# Patient Record
Sex: Female | Born: 1960 | Race: White | Hispanic: No | Marital: Married | Smoking: Never smoker
Health system: Southern US, Community
[De-identification: ages and names within clinical notes are randomized; demographics above are authoritative.]

## PROBLEM LIST (undated history)

## (undated) DIAGNOSIS — N951 Menopausal and female climacteric states: Secondary | ICD-10-CM

## (undated) DIAGNOSIS — E7800 Pure hypercholesterolemia, unspecified: Secondary | ICD-10-CM

## (undated) DIAGNOSIS — G43909 Migraine, unspecified, not intractable, without status migrainosus: Secondary | ICD-10-CM

## (undated) DIAGNOSIS — I10 Essential (primary) hypertension: Secondary | ICD-10-CM

## (undated) DIAGNOSIS — E119 Type 2 diabetes mellitus without complications: Secondary | ICD-10-CM

## (undated) DIAGNOSIS — K219 Gastro-esophageal reflux disease without esophagitis: Secondary | ICD-10-CM

## (undated) HISTORY — DX: Gastro-esophageal reflux disease without esophagitis: K21.9

## (undated) HISTORY — DX: Type 2 diabetes mellitus without complications: E11.9

## (undated) HISTORY — DX: Pure hypercholesterolemia, unspecified: E78.00

## (undated) HISTORY — DX: Migraine, unspecified, not intractable, without status migrainosus: G43.909

## (undated) HISTORY — DX: Essential (primary) hypertension: I10

## (undated) HISTORY — DX: Menopausal and female climacteric states: N95.1

---

## 2005-12-22 ENCOUNTER — Ambulatory Visit: Payer: Self-pay | Admitting: Internal Medicine

## 2006-04-22 HISTORY — PX: CARDIAC SURGERY: SHX584

## 2006-06-21 ENCOUNTER — Encounter: Payer: Self-pay | Admitting: Family Medicine

## 2006-06-22 ENCOUNTER — Encounter: Payer: Self-pay | Admitting: Family Medicine

## 2006-07-22 ENCOUNTER — Encounter: Payer: Self-pay | Admitting: Family Medicine

## 2006-08-22 ENCOUNTER — Encounter: Payer: Self-pay | Admitting: Family Medicine

## 2006-09-22 ENCOUNTER — Encounter: Payer: Self-pay | Admitting: Family Medicine

## 2006-10-21 ENCOUNTER — Encounter: Payer: Self-pay | Admitting: Family Medicine

## 2007-05-31 ENCOUNTER — Ambulatory Visit: Payer: Self-pay

## 2007-06-06 ENCOUNTER — Ambulatory Visit: Payer: Self-pay

## 2008-07-31 ENCOUNTER — Ambulatory Visit: Payer: Self-pay

## 2009-10-14 ENCOUNTER — Ambulatory Visit: Payer: Self-pay

## 2010-07-23 ENCOUNTER — Inpatient Hospital Stay: Payer: Self-pay | Admitting: Internal Medicine

## 2010-12-09 ENCOUNTER — Ambulatory Visit: Payer: Self-pay

## 2012-02-28 ENCOUNTER — Ambulatory Visit: Payer: Self-pay | Admitting: Internal Medicine

## 2012-08-22 DIAGNOSIS — N951 Menopausal and female climacteric states: Secondary | ICD-10-CM

## 2012-08-22 HISTORY — DX: Menopausal and female climacteric states: N95.1

## 2012-08-22 HISTORY — PX: COLONOSCOPY: SHX174

## 2013-02-12 ENCOUNTER — Ambulatory Visit: Payer: Self-pay | Admitting: General Surgery

## 2013-02-21 ENCOUNTER — Encounter: Payer: Self-pay | Admitting: *Deleted

## 2013-02-28 ENCOUNTER — Ambulatory Visit: Payer: Self-pay | Admitting: General Surgery

## 2013-03-20 ENCOUNTER — Encounter: Payer: Self-pay | Admitting: General Surgery

## 2013-03-20 ENCOUNTER — Ambulatory Visit (INDEPENDENT_AMBULATORY_CARE_PROVIDER_SITE_OTHER): Payer: BC Managed Care – PPO | Admitting: General Surgery

## 2013-03-20 ENCOUNTER — Other Ambulatory Visit: Payer: Self-pay | Admitting: General Surgery

## 2013-03-20 VITALS — BP 138/82 | HR 76 | Resp 14 | Ht 60.0 in | Wt 142.0 lb

## 2013-03-20 DIAGNOSIS — Z1211 Encounter for screening for malignant neoplasm of colon: Secondary | ICD-10-CM

## 2013-03-20 DIAGNOSIS — H539 Unspecified visual disturbance: Secondary | ICD-10-CM | POA: Insufficient documentation

## 2013-03-20 MED ORDER — AMOXICILLIN 500 MG PO TABS: 500.0000 mg | ORAL_TABLET | Freq: Two times a day (BID) | ORAL | Status: DC

## 2013-03-20 MED ORDER — POLYETHYLENE GLYCOL 3350 17 GM/SCOOP PO POWD: ORAL | Status: DC

## 2013-03-20 NOTE — Progress Notes (Signed)
Patient ID: Deborah Bartlett, female   DOB: [birth year], 52 y.o.   MRN: [id number]  Chief Complaint  Patient presents with  . Colonoscopy    HPI Deborah Bartlett is a 52 y.o. female.  Patient here today for colonoscopy evaluation.  States this is routine and she has never had one before.  Denies any GI symptoms.  No family history of colon cancer. HPI  Past Medical History  Diagnosis Date  . GERD (gastroesophageal reflux disease)   . Hypertension   . Hypercholesteremia   . Perimenopausal 2014    Past Surgical History  Procedure Laterality Date  . Cesarean section  1990, 1993, 1999  . 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 Hypertension Mother   . Diabetes Mother     Social History History  Substance Use Topics  . Smoking status: Never Smoker   . Smokeless tobacco: Not on file  . Alcohol Use: No    Allergies  Allergen Reactions  . Codeine Palpitations    dizzy    Current Outpatient Prescriptions  Medication Sig Dispense Refill  . atorvastatin (LIPITOR) 20 MG tablet Take 20 mg by mouth daily.      . ferrous sulfate 325 (65 FE) MG EC tablet Take 325 mg by mouth daily with breakfast.      . losartan-hydrochlorothiazide (HYZAAR) 100-25 MG per tablet Take 1 tablet by mouth daily.      . meloxicam (MOBIC) 15 MG tablet Take 15 mg by mouth daily.      Marland Kitchen omeprazole (PRILOSEC) 20 MG capsule Take 20 mg by mouth daily.      Marland Kitchen amoxicillin (AMOXIL) 500 MG tablet Take 1 tablet (500 mg total) by mouth 2 (two) times daily. Take all 4 tabs the morning of the procedure  4 tablet  0  . polyethylene glycol powder (GLYCOLAX/MIRALAX) powder 255 grams one bottle for colonoscopy prep  255 g  0   No current facility-administered medications for this visit.    Review of Systems Review of Systems  Constitutional: Negative.   Respiratory: Negative.   Cardiovascular: Negative.     Blood pressure 138/82, pulse 76, resp.  rate 14, height 5' (1.524 m), weight 142 lb (64.411 kg), last menstrual period 02/13/2013.  Physical Exam Physical Exam  Constitutional: She is oriented to person, place, and time. She appears well-developed and well-nourished.  Cardiovascular: Normal rate and regular rhythm.   Pulmonary/Chest: Effort normal and breath sounds normal.  Abdominal: Soft.  Lymphadenopathy:    She has no cervical adenopathy.  Neurological: She is alert and oriented to person, place, and time.  Skin: Skin is warm and dry.    Data Reviewed No data to review.  Assessment    Candidate for screening colonoscopy.     Plan    The indication for screening colonoscopy was reviewed. Risks and benefits including bleeding and perforation discussed.  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 In spite of this, I think antibiotic prophylaxis for a colonoscopy as appropriate.     Patient has been scheduled for a colonoscopy on 04-30-13 at [facility]. This patient has been instructed to take amoxicillin one hour pre-op.   Earline Mayotte 03/20/2013, 9:13 PM

## 2013-03-20 NOTE — Patient Instructions (Addendum)
Colonoscopy with possible biopsy/polypectomy prn: Information regarding the procedure, including its potential risks and complications (including but not limited to perforation of the bowel, which may require emergency surgery to repair, and bleeding) was verbally given to the patient. Educational information regarding lower instestinal endoscopy was given to the patient. Written instructions for how to complete the bowel prep using Miralax were provided. The importance of drinking ample fluids to avoid dehydration as a result of the prep emphasized.  Antibiotic prior to surgery  Patient has been scheduled for a colonoscopy on 04-30-13 at [facility].  This patient has been instructed to take amoxicillin one hour pre-op.

## 2013-04-24 ENCOUNTER — Telehealth: Payer: Self-pay | Admitting: *Deleted

## 2013-04-24 NOTE — Telephone Encounter (Signed)
Patient was contacted today to review medications. She states she is not taking Mobic but all other meds are the same. Patient was also instructed to pre-register since she has not done so already. We will proceed with colonoscopy that is scheduled for 04-30-13 at [facility]. This patient will call the office if she has further questions.

## 2013-04-29 ENCOUNTER — Telehealth: Payer: Self-pay | Admitting: *Deleted

## 2013-04-29 NOTE — Telephone Encounter (Signed)
Patient called the office to reschedule colonoscopy that was scheduled for 04-30-13 to 05-28-13. The patient could not get a ride worked out. Trish in endoscopy notified of the reschedule.

## 2013-05-27 ENCOUNTER — Telehealth: Payer: Self-pay | Admitting: *Deleted

## 2013-05-27 NOTE — Telephone Encounter (Signed)
Patient states she called this morning to pre-register. Bobbie in endoscopy notified. Patient reports no change in medications since last office visit. We will proceed with colonoscopy that is scheduled for 05-28-13 at [facility]. She will call the office if she has further questions.

## 2013-05-28 ENCOUNTER — Ambulatory Visit: Payer: Self-pay | Admitting: General Surgery

## 2013-05-28 DIAGNOSIS — Z1211 Encounter for screening for malignant neoplasm of colon: Secondary | ICD-10-CM

## 2013-05-29 ENCOUNTER — Encounter: Payer: Self-pay | Admitting: General Surgery

## 2013-06-04 ENCOUNTER — Telehealth: Payer: Self-pay | Admitting: Internal Medicine

## 2013-06-04 ENCOUNTER — Encounter: Payer: Self-pay | Admitting: Internal Medicine

## 2013-06-04 ENCOUNTER — Ambulatory Visit (INDEPENDENT_AMBULATORY_CARE_PROVIDER_SITE_OTHER): Payer: BC Managed Care – PPO | Admitting: Internal Medicine

## 2013-06-04 VITALS — BP 120/70 | HR 86 | Temp 98.0°F | Ht 60.5 in | Wt 145.2 lb

## 2013-06-04 DIAGNOSIS — N809 Endometriosis, unspecified: Secondary | ICD-10-CM

## 2013-06-04 DIAGNOSIS — K219 Gastro-esophageal reflux disease without esophagitis: Secondary | ICD-10-CM

## 2013-06-04 DIAGNOSIS — R51 Headache: Secondary | ICD-10-CM

## 2013-06-04 DIAGNOSIS — E7800 Pure hypercholesterolemia, unspecified: Secondary | ICD-10-CM

## 2013-06-04 DIAGNOSIS — B351 Tinea unguium: Secondary | ICD-10-CM

## 2013-06-04 DIAGNOSIS — I10 Essential (primary) hypertension: Secondary | ICD-10-CM

## 2013-06-04 MED ORDER — ATORVASTATIN CALCIUM 20 MG PO TABS: 20.0000 mg | ORAL_TABLET | Freq: Every day | ORAL | Status: DC

## 2013-06-04 MED ORDER — LOSARTAN POTASSIUM-HCTZ 100-25 MG PO TABS: 1.0000 | ORAL_TABLET | Freq: Every day | ORAL | Status: DC

## 2013-06-04 NOTE — Telephone Encounter (Signed)
done

## 2013-06-04 NOTE — Telephone Encounter (Signed)
Losartan and atorvastatin refills needed.  Pt voiced at check-out.  Forgot to tell during appt today.

## 2013-06-09 ENCOUNTER — Encounter: Payer: Self-pay | Admitting: Internal Medicine

## 2013-06-09 DIAGNOSIS — E7800 Pure hypercholesterolemia, unspecified: Secondary | ICD-10-CM | POA: Insufficient documentation

## 2013-06-09 DIAGNOSIS — I10 Essential (primary) hypertension: Secondary | ICD-10-CM | POA: Insufficient documentation

## 2013-06-09 DIAGNOSIS — K219 Gastro-esophageal reflux disease without esophagitis: Secondary | ICD-10-CM | POA: Insufficient documentation

## 2013-06-09 DIAGNOSIS — N809 Endometriosis, unspecified: Secondary | ICD-10-CM | POA: Insufficient documentation

## 2013-06-09 NOTE — Assessment & Plan Note (Signed)
On omeprazole.  Obtain records regarding previous w/up.  Instructed on small bites, chew food well and eat slowly.  Do not eat late.

## 2013-06-09 NOTE — Progress Notes (Signed)
Subjective:    Patient ID: Deborah Bartlett, female    DOB: 10-10-1960, 52 y.o.   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  She comes in today to follow up on these issues as well as to establish care.  Previously followed by Dr Veneda Melter.  Has endometriosis.  Sees Dr Luella Cook.  Up to date with her pelvic and pap smears.  Last 5/14.  No history of abnormal pap.  Has a history of intermittent headaches.  Stress triggers.  Has to get in a dark room and sleep.  Takes excedrin and this works.  She is now working two jobs.  Last headache two months ago.  They may last 2-3 days.  Does not miss work.  Headaches are bilateral.  She also has GERD.  On omeprazole.  Occasional dysphagia.  No chest pain or tightness.  Breathing stable.  Has high blood pressure.  Diagnosed after her last child was born.  She also has a history of cardiac surgery secondary to a birth defect.  Rides her bike now and walks without getting winded.  She does feel like her legs are going to give out.  This has been stable since her surgery.  Sees Dr Gwen Pounds.  Overall she feels things are stable.    Past Medical History  Diagnosis Date  . GERD (gastroesophageal reflux disease)   . Hypertension   . Hypercholesteremia   . Perimenopausal 2014  . Migraine     Outpatient Encounter Prescriptions as of 06/04/2013  Medication Sig Dispense Refill  . ferrous sulfate 325 (65 FE) MG EC tablet Take 325 mg by mouth daily with breakfast.      . omeprazole (PRILOSEC) 20 MG capsule Take 20 mg by mouth daily.      . [DISCONTINUED] atorvastatin (LIPITOR) 20 MG tablet Take 20 mg by mouth daily.      . [DISCONTINUED] losartan-hydrochlorothiazide (HYZAAR) 100-25 MG per tablet Take 1 tablet by mouth daily.      . [DISCONTINUED] amoxicillin (AMOXIL) 500 MG tablet Take 1 tablet (500 mg total) by mouth 2  (two) times daily. Take all 4 tabs the morning of the procedure  4 tablet  0  . [DISCONTINUED] meloxicam (MOBIC) 15 MG tablet Take 15 mg by mouth daily.      . [DISCONTINUED] polyethylene glycol powder (GLYCOLAX/MIRALAX) powder 255 grams one bottle for colonoscopy prep  255 g  0   No facility-administered encounter medications on file as of 06/04/2013.    Review of Systems Patient denies any headache, lightheadedness or dizziness.  Has a history of headaches.  None in two months.   No chest pain, tightness or palpitations.  No increased shortness of breath, cough or congestion.  No nausea or vomiting.  On omeprazole for acid reflux.   No abdominal pain or cramping.  No bowel change, such as diarrhea, constipation, BRBPR or melana.  No urine change.   Overall feels things are stable.      Objective:   Physical Exam Filed Vitals:   06/04/13 1450  BP: 120/70  Pulse: 86  Temp: 98 F (36.7 C)   Pulse recheck 46  52 year old female in no acute distress.   HEENT:  Nares- clear.  Oropharynx - without lesions. NECK:  Supple.  Nontender.  No audible bruit.  HEART:  Appears to be regular. LUNGS:  No crackles or  wheezing audible.  Respirations even and unlabored.  RADIAL PULSE:  Equal bilaterally.    BREASTS:  Performed by gyn.  ABDOMEN:  Soft, nontender.  Bowel sounds present and normal.  No audible abdominal bruit.  GU:  Performed by gyn.  EXTREMITIES:  No increased edema present.  DP pulses palpable and equal bilaterally.         Assessment & Plan:  PODIATRY.  Has a toe nail she would like evaluated.  Some pain.  Fungus present.  Refer to podiatry for evaluation.   HEALTH MAINTENANCE.  Gets her breast, pelvic and pap smears through Dr Hiram Comber office.  Mammogram 5/14 - ok. Had colonoscopy 2014.  (05/28/13 - normal).  Recommended repeat colonoscopy in 10 years.    I spent 45 minutes with the patient and more than 50% of the time was spent in consultation regarding the above.

## 2013-06-09 NOTE — Assessment & Plan Note (Signed)
On atorvastatin.  Low cholesterol diet.  Check lipid panel and liver function.   

## 2013-06-09 NOTE — Assessment & Plan Note (Signed)
Blood pressure controlled on current regimen.  Follow metabolic panel.   

## 2013-06-09 NOTE — Assessment & Plan Note (Signed)
No headache in two months.  Follow.

## 2013-06-09 NOTE — Assessment & Plan Note (Signed)
Sees Dr Luella Cook.  Up to date with her pelvic exams.

## 2013-06-18 ENCOUNTER — Ambulatory Visit: Payer: BC Managed Care – PPO | Admitting: Podiatry

## 2013-06-21 ENCOUNTER — Other Ambulatory Visit (INDEPENDENT_AMBULATORY_CARE_PROVIDER_SITE_OTHER): Payer: BC Managed Care – PPO

## 2013-06-21 ENCOUNTER — Encounter: Payer: Self-pay | Admitting: Podiatry

## 2013-06-21 ENCOUNTER — Ambulatory Visit (INDEPENDENT_AMBULATORY_CARE_PROVIDER_SITE_OTHER): Payer: BC Managed Care – PPO | Admitting: Podiatry

## 2013-06-21 VITALS — BP 120/70 | HR 86 | Resp 16 | Ht 60.0 in | Wt 140.0 lb

## 2013-06-21 DIAGNOSIS — L600 Ingrowing nail: Secondary | ICD-10-CM

## 2013-06-21 DIAGNOSIS — I10 Essential (primary) hypertension: Secondary | ICD-10-CM

## 2013-06-21 DIAGNOSIS — E7800 Pure hypercholesterolemia, unspecified: Secondary | ICD-10-CM

## 2013-06-21 DIAGNOSIS — B351 Tinea unguium: Secondary | ICD-10-CM

## 2013-06-21 DIAGNOSIS — R51 Headache: Secondary | ICD-10-CM

## 2013-06-21 LAB — CBC WITH DIFFERENTIAL/PLATELET
Basophils Absolute: 0 10*3/uL (ref 0.0–0.1)
Basophils Relative: 0.9 % (ref 0.0–3.0)
Eosinophils Relative: 2.2 % (ref 0.0–5.0)
HCT: 39 % (ref 36.0–46.0)
Hemoglobin: 13.2 g/dL (ref 12.0–15.0)
Lymphocytes Relative: 30.6 % (ref 12.0–46.0)
Lymphs Abs: 1.4 10*3/uL (ref 0.7–4.0)
MCHC: 33.8 g/dL (ref 30.0–36.0)
MCV: 86.8 fl (ref 78.0–100.0)
Monocytes Absolute: 0.4 10*3/uL (ref 0.1–1.0)
Monocytes Relative: 7.9 % (ref 3.0–12.0)
Neutro Abs: 2.7 10*3/uL (ref 1.4–7.7)
Platelets: 302 10*3/uL (ref 150.0–400.0)
RBC: 4.49 Mil/uL (ref 3.87–5.11)
RDW: 13.3 % (ref 11.5–14.6)
WBC: 4.7 10*3/uL (ref 4.5–10.5)

## 2013-06-21 LAB — COMPREHENSIVE METABOLIC PANEL
ALT: 23 U/L (ref 0–35)
CO2: 29 mEq/L (ref 19–32)
Calcium: 9.8 mg/dL (ref 8.4–10.5)
Chloride: 101 mEq/L (ref 96–112)
Creatinine, Ser: 0.7 mg/dL (ref 0.4–1.2)
GFR: 90.73 mL/min (ref >60.00)
Glucose, Bld: 100 mg/dL — ABNORMAL HIGH (ref 70–99)
Sodium: 137 mEq/L (ref 135–145)
Total Protein: 7.6 g/dL (ref 6.0–8.3)

## 2013-06-21 LAB — TSH: TSH: 1.36 u[IU]/mL (ref 0.35–5.50)

## 2013-06-21 LAB — LIPID PANEL
Total CHOL/HDL Ratio: 4
Triglycerides: 123 mg/dL (ref 0.0–149.0)

## 2013-06-21 MED ORDER — HYDROCODONE-ACETAMINOPHEN 10-325 MG PO TABS: 1.0000 | ORAL_TABLET | Freq: Three times a day (TID) | ORAL | Status: DC | PRN

## 2013-06-21 NOTE — Patient Instructions (Signed)

## 2013-06-21 NOTE — Progress Notes (Signed)
N ingrown toenail , thick , sore  L right great medial side hurts more  D ongoing for years  O gradual   C worrse A anything to touch it  T no treatment ,

## 2013-06-21 NOTE — Progress Notes (Signed)
Subjective:     Patient ID: Deborah Bartlett, female   DOB: Dec 30, 1960, 52 y.o.   MRN: [id number]  HPI patient presents stating I have ingrown toenails on my big nail second nail right foot and might big toenail on the left with the big one on the right and the second one being very thick. Patient states going on for a number of years and getting worse and that she cannot cut them herself referred by Dr. Dale Stratford   Review of Systems  All other systems reviewed and are negative.       Objective:   Physical Exam  Nursing note and vitals reviewed. Constitutional: She is oriented to person, place, and time.  Cardiovascular: Intact distal pulses.   Neurological: She is oriented to person, place, and time.  Skin: Skin is warm.   patient has thickened deformed nails hallux second nail right hallux nail left is ingrown in the corners patient's neurovascular status intact and muscle strength found to be adequate     Assessment:     Damaged hallux and second nails right with dorsal pain noted an ingrown toenail left hallux    Plan:     H&P performed and condition discussed. Today were focusing on the right foot. I discussed correction of the nails and explained permanent removal of the nailbeds and risk associated with the procedure patient want surgery and today I infiltrated 120 mg Xylocaine Marcaine mixture remove the hallux and second nails exposed the matrix and applied phenol followed by alcohol 5 applications with alcohol lavage after that and sterile dressing application. Hydrocodone for postoperative pain and soaks to beginning 24 hours. Reappoint 3 weeks to discuss the left foot

## 2013-06-21 NOTE — Addendum Note (Signed)
Addended by: Lenn Sink on: 06/21/2013 04:15 PM   Modules accepted: Orders

## 2013-06-23 ENCOUNTER — Encounter: Payer: Self-pay | Admitting: Internal Medicine

## 2013-06-25 NOTE — Telephone Encounter (Signed)
Mailed unread message to pt  

## 2013-07-12 ENCOUNTER — Encounter: Payer: Self-pay | Admitting: Podiatry

## 2013-07-12 ENCOUNTER — Ambulatory Visit (INDEPENDENT_AMBULATORY_CARE_PROVIDER_SITE_OTHER): Payer: BC Managed Care – PPO | Admitting: Podiatry

## 2013-07-12 VITALS — BP 120/72 | HR 80 | Resp 16 | Ht 61.0 in | Wt 140.0 lb

## 2013-07-12 DIAGNOSIS — L600 Ingrowing nail: Secondary | ICD-10-CM

## 2013-07-12 NOTE — Progress Notes (Signed)
Subjective:     Patient ID: Deborah Bartlett, female   DOB: Dec 26, 1960, 52 y.o.   MRN: [id number]  HPI patient's right hallux and second nail are crusted over and healing well postoperatively. She needs the left one done but wants to hold off  Review of Systems     Objective:   Physical Exam Well-healing nail side    Assessment:     Nail sites doing well right foot    Plan:     Discharge right will reappoint when she wants her left nails fixed

## 2013-11-25 ENCOUNTER — Ambulatory Visit (INDEPENDENT_AMBULATORY_CARE_PROVIDER_SITE_OTHER): Payer: BC Managed Care – PPO | Admitting: Internal Medicine

## 2013-11-25 ENCOUNTER — Encounter: Payer: Self-pay | Admitting: Internal Medicine

## 2013-11-25 VITALS — BP 130/80 | HR 84 | Temp 98.2°F | Ht 61.0 in | Wt 142.5 lb

## 2013-11-25 DIAGNOSIS — N809 Endometriosis, unspecified: Secondary | ICD-10-CM

## 2013-11-25 DIAGNOSIS — E7800 Pure hypercholesterolemia, unspecified: Secondary | ICD-10-CM

## 2013-11-25 DIAGNOSIS — K219 Gastro-esophageal reflux disease without esophagitis: Secondary | ICD-10-CM

## 2013-11-25 DIAGNOSIS — H539 Unspecified visual disturbance: Secondary | ICD-10-CM

## 2013-11-25 DIAGNOSIS — R51 Headache: Secondary | ICD-10-CM

## 2013-11-25 DIAGNOSIS — I10 Essential (primary) hypertension: Secondary | ICD-10-CM

## 2013-11-25 LAB — HEPATIC FUNCTION PANEL
ALT: 22 U/L (ref 0–35)
AST: 20 U/L (ref 0–37)
Albumin: 4.1 g/dL (ref 3.5–5.2)
Alkaline Phosphatase: 101 U/L (ref 39–117)
BILIRUBIN DIRECT: 0.1 mg/dL (ref 0.0–0.3)
BILIRUBIN TOTAL: 0.3 mg/dL (ref 0.3–1.2)
Total Protein: 7.2 g/dL (ref 6.0–8.3)

## 2013-11-25 LAB — BASIC METABOLIC PANEL
BUN: 13 mg/dL (ref 6–23)
CALCIUM: 10.1 mg/dL (ref 8.4–10.5)
CO2: 32 mEq/L (ref 19–32)
CREATININE: 0.7 mg/dL (ref 0.4–1.2)
Chloride: 100 mEq/L (ref 96–112)
GFR: 98.41 mL/min (ref >60.00)
GLUCOSE: 92 mg/dL (ref 70–99)
Potassium: 4.3 mEq/L (ref 3.5–5.1)
Sodium: 140 mEq/L (ref 135–145)

## 2013-11-25 LAB — LIPID PANEL
Cholesterol: 184 mg/dL (ref 0–200)
HDL: 40.9 mg/dL (ref >39.00)
LDL Cholesterol: 107 mg/dL — ABNORMAL HIGH (ref 0–99)
Total CHOL/HDL Ratio: 4
Triglycerides: 182 mg/dL — ABNORMAL HIGH (ref 0.0–149.0)
VLDL: 36.4 mg/dL (ref 0.0–40.0)

## 2013-11-25 NOTE — Progress Notes (Signed)
Subjective:    Patient ID: Deborah Bartlett, female    DOB: 22-Dec-1960, 53 y.o.   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  She comes in today for a scheduled follow up.   Has endometriosis.  Sees Dr Luella Cookosenow.  Up to date with her pelvic and pap smears.  Last 5/14.  Has f/u scheduled 5/15.  No history of abnormal pap.  Has a history of intermittent headaches.  Stress triggers.  Has to get in a dark room and sleep.  Takes excedrin and this works.  Headaches are better.   She also has GERD.  On omeprazole.  No chest pain or tightness.  Breathing stable.  Has high blood pressure.  Diagnosed after her last child was born.  She also has a history of cardiac surgery secondary to a birth defect.  Sees Dr Gwen PoundsKowalski.  Overall she feels things are stable.  She had an episode this weekend while out shopping, that her vision was more blurred.  Just could not focus as well.  No actual vision loss.  No other neurological changes.  Blood pressure - 132/80.   No pain in the eye.  Is evaluated at [facility].     Past Medical History  Diagnosis Date  . GERD (gastroesophageal reflux disease)   . Hypertension   . Hypercholesteremia   . Perimenopausal 2014  . Migraine     Outpatient Encounter Prescriptions as of 11/25/2013  Medication Sig  . atorvastatin (LIPITOR) 20 MG tablet Take 1 tablet (20 mg total) by mouth daily.  . ferrous sulfate 325 (65 FE) MG EC tablet Take 325 mg by mouth daily with breakfast.  . losartan-hydrochlorothiazide (HYZAAR) 100-25 MG per tablet Take 1 tablet by mouth daily.  . Multiple Vitamin (MULTIVITAMIN) capsule Take 1 capsule by mouth daily.  Marland Kitchen. omeprazole (PRILOSEC) 20 MG capsule Take 20 mg by mouth daily.  . [DISCONTINUED] HYDROcodone-acetaminophen (NORCO) 10-325 MG per tablet Take 1 tablet by mouth every 8 (eight) hours as needed for pain.     Review of Systems Patient denies any headache, lightheadedness or dizziness.  Has a history of headaches.  Better.  Vision change as outlined.    No chest pain, tightness or palpitations.  No increased shortness of breath, cough or congestion.  No nausea or vomiting.  On omeprazole for acid reflux.   No abdominal pain or cramping.  No bowel change, such as diarrhea, constipation, BRBPR or melana.  No urine change.   Overall feels things are stable.      Objective:   Physical Exam  Filed Vitals:   11/25/13 1012  BP: 130/80  Pulse: 84  Temp: 98.2 F (36.8 C)   Blood pressure recheck:  25128/484  53 year old female in no acute distress.   HEENT:  Nares- clear.  Oropharynx - without lesions. NECK:  Supple.  Nontender.  No audible bruit.  HEART:  Appears to be regular. LUNGS:  No crackles or wheezing audible.  Respirations even and unlabored.  RADIAL PULSE:  Equal bilaterally.   ABDOMEN:  Soft, nontender.  Bowel sounds present and normal.  No audible abdominal bruit.  EXTREMITIES:  No increased edema present.  DP pulses palpable and equal bilaterally.         Assessment & Plan:  HEALTH MAINTENANCE.  Gets her breast, pelvic and pap smears through Dr Hiram Comberosenows office.  Mammogram 5/14 - ok. Had colonoscopy 2014.  (05/28/13 - normal).  Recommended repeat colonoscopy in 10 years.  Has f/u scheduled in 5/15.    I spent 25 minutes with the patient and more than 50% of the time was spent in consultation regarding the above.

## 2013-11-25 NOTE — Progress Notes (Signed)
Pre-visit discussion using our clinic review tool. No additional management support is needed unless otherwise documented below in the visit note.  

## 2013-11-27 ENCOUNTER — Encounter: Payer: Self-pay | Admitting: *Deleted

## 2013-11-27 ENCOUNTER — Encounter: Payer: Self-pay | Admitting: Internal Medicine

## 2013-11-27 NOTE — Assessment & Plan Note (Signed)
Blood pressure controlled on current regimen.  Follow metabolic panel.   

## 2013-11-27 NOTE — Telephone Encounter (Signed)
Mailed [facility] Lipid panel & resent mychart message also

## 2013-11-27 NOTE — Assessment & Plan Note (Signed)
Sees Dr Luella Cookosenow.  Up to date with her pelvic exams.  Scheduled a f/u in 5/15.

## 2013-11-27 NOTE — Assessment & Plan Note (Signed)
On atorvastatin.  Low cholesterol diet.  Follow lipid panel and liver function.   

## 2013-11-27 NOTE — Assessment & Plan Note (Signed)
On omeprazole.  No problems reported today.

## 2013-11-27 NOTE — Assessment & Plan Note (Signed)
Headaches better.  Follow.   

## 2013-11-27 NOTE — Assessment & Plan Note (Signed)
Vision change as outlined.  No actual vision loss.  Different from her migraine vision changes.  Will have opthalmology evaluate.

## 2013-12-20 ENCOUNTER — Other Ambulatory Visit: Payer: Self-pay | Admitting: Internal Medicine

## 2014-01-14 ENCOUNTER — Telehealth: Payer: Self-pay | Admitting: Internal Medicine

## 2014-01-14 NOTE — Telephone Encounter (Signed)
You can also call patient at work number [phone] Patient called in today states her right hand has been going numb and it is all fingers however her thumb is the worst and she says it is starting to be hard to pick up things. Patient would like to know if she can have a referral or does she need to come in and see you. She did say that it has been going on for a while and it doesn't hurt it just tingles Please advise.

## 2014-01-15 NOTE — Telephone Encounter (Signed)
Notified patient. Appt scheduled 01/20/14 for evaluation. Advised to call back with worsening symptoms prior to appt

## 2014-01-15 NOTE — Telephone Encounter (Signed)
I recommend an evaluation asap to better confirm etiology and to know where best to refer.  Thanks.

## 2014-01-20 ENCOUNTER — Ambulatory Visit (INDEPENDENT_AMBULATORY_CARE_PROVIDER_SITE_OTHER): Payer: BC Managed Care – PPO | Admitting: Internal Medicine

## 2014-01-20 ENCOUNTER — Encounter: Payer: Self-pay | Admitting: Internal Medicine

## 2014-01-20 VITALS — BP 110/70 | HR 87 | Temp 98.1°F | Ht 61.0 in | Wt 143.5 lb

## 2014-01-20 DIAGNOSIS — R51 Headache: Secondary | ICD-10-CM

## 2014-01-20 DIAGNOSIS — E7800 Pure hypercholesterolemia, unspecified: Secondary | ICD-10-CM

## 2014-01-20 DIAGNOSIS — K219 Gastro-esophageal reflux disease without esophagitis: Secondary | ICD-10-CM

## 2014-01-20 DIAGNOSIS — R200 Anesthesia of skin: Secondary | ICD-10-CM

## 2014-01-20 DIAGNOSIS — R209 Unspecified disturbances of skin sensation: Secondary | ICD-10-CM

## 2014-01-20 DIAGNOSIS — N809 Endometriosis, unspecified: Secondary | ICD-10-CM

## 2014-01-20 DIAGNOSIS — I10 Essential (primary) hypertension: Secondary | ICD-10-CM

## 2014-01-20 DIAGNOSIS — H539 Unspecified visual disturbance: Secondary | ICD-10-CM

## 2014-01-20 LAB — HM MAMMOGRAPHY

## 2014-01-20 MED ORDER — LOSARTAN POTASSIUM-HCTZ 100-25 MG PO TABS: 1.0000 | ORAL_TABLET | Freq: Every day | ORAL | Status: DC

## 2014-01-20 NOTE — Progress Notes (Signed)
Pre visit review using our clinic review tool, if applicable. No additional management support is needed unless otherwise documented below in the visit note. 

## 2014-01-25 ENCOUNTER — Encounter: Payer: Self-pay | Admitting: Internal Medicine

## 2014-01-25 DIAGNOSIS — R200 Anesthesia of skin: Secondary | ICD-10-CM | POA: Insufficient documentation

## 2014-01-25 NOTE — Assessment & Plan Note (Signed)
Blood pressure controlled on current regimen.  Follow metabolic panel.   

## 2014-01-25 NOTE — Assessment & Plan Note (Signed)
On atorvastatin.  Low cholesterol diet.  Follow lipid panel and liver function.   

## 2014-01-25 NOTE — Assessment & Plan Note (Signed)
Sees Dr Luella Cook.  Up to date with her pelvic exams.  Was to have followed up in 5/15.  Need records.

## 2014-01-25 NOTE — Assessment & Plan Note (Signed)
Headaches better.  Follow.   

## 2014-01-25 NOTE — Assessment & Plan Note (Signed)
Previous vision change as outlined.  No actual vision loss.  Different from her migraine vision changes.  Was supposed to have seen opthalmology.  Did not follow through with appt.  Has had no further episodes.  Taking aspirin daily.  Given the previous vision changes and now with the upper extremity numbness and tingling, will have neurology evaluate.  Discussed possible MRI, nerve conduction studies, etc.  Prefers neurology evaluation first.  Further w/up pending their assessment.

## 2014-01-25 NOTE — Assessment & Plan Note (Signed)
Persistent upper extremity numbness.  Exam as outlined.  Given this associated with the previous vision change, will have neurology evaluate.  See above.  Continue daily aspirin.

## 2014-01-25 NOTE — Assessment & Plan Note (Signed)
On omeprazole.  Reported no symptoms.    

## 2014-01-25 NOTE — Progress Notes (Signed)
Subjective:    Patient ID: Deborah Bartlett, female    DOB: 1960-11-09, 53 y.o.   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  She comes in today for a scheduled follow up.   Has a history of endometriosis.  Sees Dr Luella Cookosenow.  Up to date with her pelvic and pap smears.  No history of abnormal pap.  Has a history of intermittent headaches.  Stress triggers.  Has to get in a dark room and sleep.  Takes excedrin and this works.  Headaches are better.   She also has GERD.  On omeprazole.  No chest pain or tightness.  Breathing stable.  Has high blood pressure.  Diagnosed after her last child was born.  She also has a history of cardiac surgery secondary to a birth defect.  Sees Dr Gwen PoundsKowalski.  Previous episode while out shopping, where her vision was more blurred.  Just could not focus as well.  No actual vision loss.  No other neurological changes.  Blood pressure - 132/80.   No pain in the eye.  Has a history of headaches.  Her vision will appear to be like a kaleidoscope and then followed by headache.  This previous episode was not similar to her "migraine headaches".  She has had no further vision change.  She does report that she has noticed numbness/tingling in her right hand and arm.  Worse in the am.  Right arm more than left.  Describes a throbbing sensation.  No neck pain or stiffness.  Harder to hold things.  No other neurological changes.    Past Medical History  Diagnosis Date  . GERD (gastroesophageal reflux disease)   . Hypertension   . Hypercholesteremia   . Perimenopausal 2014  . Migraine     Outpatient Encounter Prescriptions as of 01/20/2014  Medication Sig  . atorvastatin (LIPITOR) 20 MG tablet TAKE ONE TABLET BY MOUTH ONE TIME DAILY   . ferrous sulfate 325 (65 FE) MG EC tablet Take 325 mg by mouth daily with breakfast.  .  losartan-hydrochlorothiazide (HYZAAR) 100-25 MG per tablet Take 1 tablet by mouth daily.  . Multiple Vitamin (MULTIVITAMIN) capsule Take 1 capsule by mouth daily.  Marland Kitchen. omeprazole (PRILOSEC) 20 MG capsule Take 20 mg by mouth daily.  . [DISCONTINUED] losartan-hydrochlorothiazide (HYZAAR) 100-25 MG per tablet Take 1 tablet by mouth daily.    Review of Systems Patient denies any significant headaches.  No lightheadedness or dizziness.  No vision changes or issues since last visit.  No chest pain, tightness or palpitations.  No increased shortness of breath, cough or congestion.  No nausea or vomiting.  On omeprazole for acid reflux.   No abdominal pain or cramping.  No bowel change, such as diarrhea, constipation, BRBPR or melana.  No urine change.   Right arm numbness, tingling and aching as outlined.  Worse in am.  Right more than left.  (left minimal).       Objective:   Physical Exam  Filed Vitals:   01/20/14 1412  BP: 110/70  Pulse: 87  Temp: 98.1 F (36.7 C)   Blood pressure recheck:  83122/478  53 year old female in no acute distress.   HEENT:  Nares- clear.  Oropharynx - without lesions. NECK:  Supple.  Nontender.  No audible bruit.  HEART:  Appears to be regular. LUNGS:  No crackles  or wheezing audible.  Respirations even and unlabored.  RADIAL PULSE:  Equal bilaterally.   ABDOMEN:  Soft, nontender.  Bowel sounds present and normal.  No audible abdominal bruit.  EXTREMITIES:  No increased edema present.  DP pulses palpable and equal bilaterally.  MSK:  Negative phalens and tinels.  Grip strength appears to be equal bilateral hands.  Motor strength appears to be equal bilateral upper extremities.          Assessment & Plan:  HEALTH MAINTENANCE.  Gets her breast, pelvic and pap smears through Dr Hiram Comber office.  Mammogram 5/14 - ok. Had colonoscopy 2014.  (05/28/13 - normal).  Recommended repeat colonoscopy in 10 years.  Apparently had f/u scheduled 5/15 with Dr Luella Cook.  Need  records.     I spent 25 minutes with the patient and more than 50% of the time was spent in consultation regarding the above.

## 2014-03-11 ENCOUNTER — Other Ambulatory Visit (INDEPENDENT_AMBULATORY_CARE_PROVIDER_SITE_OTHER): Payer: BC Managed Care – PPO

## 2014-03-11 ENCOUNTER — Other Ambulatory Visit: Payer: BC Managed Care – PPO

## 2014-03-11 DIAGNOSIS — E7800 Pure hypercholesterolemia, unspecified: Secondary | ICD-10-CM

## 2014-03-11 DIAGNOSIS — I10 Essential (primary) hypertension: Secondary | ICD-10-CM

## 2014-03-12 LAB — BASIC METABOLIC PANEL
BUN: 17 mg/dL (ref 6–23)
CALCIUM: 10.1 mg/dL (ref 8.4–10.5)
CO2: 29 mEq/L (ref 19–32)
Chloride: 103 mEq/L (ref 96–112)
Creatinine, Ser: 0.8 mg/dL (ref 0.4–1.2)
GFR: 84.99 mL/min (ref >60.00)
GLUCOSE: 107 mg/dL — AB (ref 70–99)
Potassium: 3.8 mEq/L (ref 3.5–5.1)
SODIUM: 140 meq/L (ref 135–145)

## 2014-03-12 LAB — LIPID PANEL
Cholesterol: 197 mg/dL (ref 0–200)
HDL: 49.4 mg/dL
NonHDL: 147.6
Total CHOL/HDL Ratio: 4
Triglycerides: 264 mg/dL — ABNORMAL HIGH (ref 0.0–149.0)
VLDL: 52.8 mg/dL — ABNORMAL HIGH (ref 0.0–40.0)

## 2014-03-12 LAB — HEPATIC FUNCTION PANEL
ALBUMIN: 4.4 g/dL (ref 3.5–5.2)
ALT: 27 U/L (ref 0–35)
AST: 24 U/L (ref 0–37)
Alkaline Phosphatase: 121 U/L — ABNORMAL HIGH (ref 39–117)
Bilirubin, Direct: 0.1 mg/dL (ref 0.0–0.3)
Total Bilirubin: 0.6 mg/dL (ref 0.2–1.2)
Total Protein: 7.4 g/dL (ref 6.0–8.3)

## 2014-03-12 LAB — LDL CHOLESTEROL, DIRECT: Direct LDL: 124.2 mg/dL

## 2014-03-13 ENCOUNTER — Encounter: Payer: Self-pay | Admitting: Internal Medicine

## 2014-03-14 ENCOUNTER — Encounter: Payer: Self-pay | Admitting: Internal Medicine

## 2014-03-14 ENCOUNTER — Ambulatory Visit (INDEPENDENT_AMBULATORY_CARE_PROVIDER_SITE_OTHER): Payer: BC Managed Care – PPO | Admitting: Internal Medicine

## 2014-03-14 VITALS — BP 130/80 | HR 82 | Temp 97.8°F | Ht 61.0 in | Wt 144.8 lb

## 2014-03-14 DIAGNOSIS — R51 Headache: Secondary | ICD-10-CM

## 2014-03-14 DIAGNOSIS — E7800 Pure hypercholesterolemia, unspecified: Secondary | ICD-10-CM

## 2014-03-14 DIAGNOSIS — K219 Gastro-esophageal reflux disease without esophagitis: Secondary | ICD-10-CM

## 2014-03-14 DIAGNOSIS — R209 Unspecified disturbances of skin sensation: Secondary | ICD-10-CM

## 2014-03-14 DIAGNOSIS — M545 Low back pain, unspecified: Secondary | ICD-10-CM

## 2014-03-14 DIAGNOSIS — R200 Anesthesia of skin: Secondary | ICD-10-CM

## 2014-03-14 DIAGNOSIS — R7309 Other abnormal glucose: Secondary | ICD-10-CM

## 2014-03-14 DIAGNOSIS — I10 Essential (primary) hypertension: Secondary | ICD-10-CM

## 2014-03-14 DIAGNOSIS — N809 Endometriosis, unspecified: Secondary | ICD-10-CM

## 2014-03-14 DIAGNOSIS — R739 Hyperglycemia, unspecified: Secondary | ICD-10-CM

## 2014-03-14 NOTE — Progress Notes (Signed)
Pre visit review using our clinic review tool, if applicable. No additional management support is needed unless otherwise documented below in the visit note. 

## 2014-03-17 ENCOUNTER — Encounter: Payer: Self-pay | Admitting: Internal Medicine

## 2014-03-17 DIAGNOSIS — M549 Dorsalgia, unspecified: Secondary | ICD-10-CM | POA: Insufficient documentation

## 2014-03-17 NOTE — Assessment & Plan Note (Signed)
Sees Dr Rosenow.  Up to date with her pelvic exams.  Followed up in 5/15.    

## 2014-03-17 NOTE — Assessment & Plan Note (Signed)
On atorvastatin.  Low cholesterol diet.  Follow lipid panel and liver function.  Recent cholesterol panel revealed increased triglycerides.  We discussed diet and exercise.  Low carb diet.  Diet information given.

## 2014-03-17 NOTE — Assessment & Plan Note (Signed)
Fell back.  Was working in her yard.  Tripped.  Sore "tail bone".  Discussed with her today.  Some tenderness over the sacrum and coccyx. Discussed xray.  Will hold on xray.  Pillow as discussed.  Notify me if persistent problems.

## 2014-03-17 NOTE — Assessment & Plan Note (Signed)
Wearing a splint.  Saw Dr Sherryll BurgerShah.  Planning for NCS 03/23/14.

## 2014-03-17 NOTE — Assessment & Plan Note (Signed)
On omeprazole.  Reported no symptoms.    

## 2014-03-17 NOTE — Progress Notes (Signed)
Subjective:    Patient ID: Deborah Bartlett, female    DOB: Apr 15, 1961, 53 y.o.   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  She comes in today for a scheduled follow up.   Has endometriosis. Sees Dr Luella Cook.  Up to date with her pelvic and pap smears.  Last 5/15.  No history of abnormal pap.  Has a history of intermittent headaches.  Stress triggers.  Has to get in a dark room and sleep.  Takes excedrin and this works.  Headaches are better. She just recently saw Dr Sherryll Burger.  He instructed her to take magnesium.  Also gave her imitrex to have if needed.   Had to use this on one occasion.  Tolerated.  Worked.   She also has GERD.  On omeprazole.  No chest pain or tightness.  Breathing stable.  Has high blood pressure.  Diagnosed after her last child was born.  She also has a history of cardiac surgery secondary to a birth defect.  Sees Dr Gwen Pounds.   Also saw Dr Sherryll Burger for her hand pain.  Wearing a splint.  Is scheduled for NCS 03/23/14.    Past Medical History  Diagnosis Date  . GERD (gastroesophageal reflux disease)   . Hypertension   . Hypercholesteremia   . Perimenopausal 2014  . Migraine     Outpatient Encounter Prescriptions as of 03/14/2014  Medication Sig  . aspirin 81 MG tablet Take 81 mg by mouth daily.  Marland Kitchen atorvastatin (LIPITOR) 20 MG tablet TAKE ONE TABLET BY MOUTH ONE TIME DAILY   . ferrous sulfate 325 (65 FE) MG EC tablet Take 325 mg by mouth daily with breakfast.  . losartan-hydrochlorothiazide (HYZAAR) 100-25 MG per tablet Take 1 tablet by mouth daily.  . Multiple Vitamin (MULTIVITAMIN) capsule Take 1 capsule by mouth daily.  Marland Kitchen omeprazole (PRILOSEC) 20 MG capsule Take 20 mg by mouth daily.    Review of Systems Patient denies any significant problems with headache.  No lightheadedness or dizziness.  Has a history of headaches.  Better.  No  chest pain, tightness or palpitations.  No increased shortness of breath, cough or congestion.  No nausea or vomiting.  On omeprazole for acid reflux.   No abdominal pain or cramping.  No bowel change, such as diarrhea, constipation, BRBPR or melana.  No urine change.   Overall feels things are stable.      Objective:   Physical Exam  Filed Vitals:   03/14/14 1108  BP: 130/80  Pulse: 82  Temp: 97.8 F (36.6 C)   Blood pressure recheck:  41/89  53 year old female in no acute distress.   HEENT:  Nares- clear.  Oropharynx - without lesions. NECK:  Supple.  Nontender.  No audible bruit.  HEART:  Appears to be regular. LUNGS:  No crackles or wheezing audible.  Respirations even and unlabored.  RADIAL PULSE:  Equal bilaterally.   ABDOMEN:  Soft, nontender.  Bowel sounds present and normal.  No audible abdominal bruit.  EXTREMITIES:  No increased edema present.  DP pulses palpable and equal bilaterally.         Assessment & Plan:  HEALTH MAINTENANCE.  Gets her breast, pelvic and pap smears through Dr Hiram Comber office.  Up to date.   Mammogram 6/15 - ok (per pt).   Had colonoscopy 2014.  (05/28/13 - normal).  Recommended  repeat colonoscopy in 10 years.   I spent 25 minutes with the patient and more than 50% of the time was spent in consultation regarding the above.

## 2014-03-17 NOTE — Assessment & Plan Note (Signed)
Saw Dr Sherryll BurgerShah.  Taking magnesium now.  Has imitrex if needed.  Follow.

## 2014-03-17 NOTE — Assessment & Plan Note (Signed)
Blood pressure controlled on current regimen.  Follow metabolic panel.   

## 2014-04-07 ENCOUNTER — Other Ambulatory Visit: Payer: BC Managed Care – PPO

## 2014-05-30 ENCOUNTER — Encounter: Payer: Self-pay | Admitting: Internal Medicine

## 2014-05-30 ENCOUNTER — Ambulatory Visit (INDEPENDENT_AMBULATORY_CARE_PROVIDER_SITE_OTHER): Payer: BC Managed Care – PPO | Admitting: Internal Medicine

## 2014-05-30 VITALS — BP 124/80 | HR 80 | Temp 97.9°F | Ht 61.0 in | Wt 148.0 lb

## 2014-05-30 DIAGNOSIS — E7800 Pure hypercholesterolemia, unspecified: Secondary | ICD-10-CM

## 2014-05-30 DIAGNOSIS — R519 Headache, unspecified: Secondary | ICD-10-CM

## 2014-05-30 DIAGNOSIS — M25511 Pain in right shoulder: Secondary | ICD-10-CM

## 2014-05-30 DIAGNOSIS — I10 Essential (primary) hypertension: Secondary | ICD-10-CM

## 2014-05-30 DIAGNOSIS — N809 Endometriosis, unspecified: Secondary | ICD-10-CM

## 2014-05-30 DIAGNOSIS — K219 Gastro-esophageal reflux disease without esophagitis: Secondary | ICD-10-CM

## 2014-05-30 DIAGNOSIS — R739 Hyperglycemia, unspecified: Secondary | ICD-10-CM

## 2014-05-30 DIAGNOSIS — R51 Headache: Secondary | ICD-10-CM

## 2014-05-30 DIAGNOSIS — R200 Anesthesia of skin: Secondary | ICD-10-CM

## 2014-05-30 NOTE — Progress Notes (Signed)
Pre visit review using our clinic review tool, if applicable. No additional management support is needed unless otherwise documented below in the visit note. 

## 2014-05-31 LAB — GLUCOSE, FASTING: GLUCOSE, FASTING: 86 mg/dL (ref 70–99)

## 2014-05-31 LAB — HEMOGLOBIN A1C
HEMOGLOBIN A1C: 6 % — AB (ref <5.7)
Mean Plasma Glucose: 126 mg/dL — ABNORMAL HIGH (ref <117)

## 2014-06-01 ENCOUNTER — Encounter: Payer: Self-pay | Admitting: Internal Medicine

## 2014-06-08 ENCOUNTER — Encounter: Payer: Self-pay | Admitting: Internal Medicine

## 2014-06-08 DIAGNOSIS — M25512 Pain in left shoulder: Secondary | ICD-10-CM | POA: Insufficient documentation

## 2014-06-08 NOTE — Assessment & Plan Note (Signed)
Blood pressure controlled on current regimen.  Follow metabolic panel.   

## 2014-06-08 NOTE — Assessment & Plan Note (Signed)
Wearing a splint.  Saw Dr Sherryll BurgerShah.  Had NCS.  CTS R>L,  Saw ortho,

## 2014-06-08 NOTE — Assessment & Plan Note (Signed)
Sees Dr Luella Cookosenow.  Up to date with her pelvic exams.  Followed up in 5/15.

## 2014-06-08 NOTE — Assessment & Plan Note (Signed)
On omeprazole.  Reported no symptoms.

## 2014-06-08 NOTE — Progress Notes (Signed)
Subjective:    Patient ID: Deborah Bartlett, female    DOB: [birth year], 53 y.o.   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  She comes in today for a scheduled follow up.   Has endometriosis. Sees Dr Luella Cookosenow.  Up to date with her pelvic and pap smears.  Last 5/15.  No history of abnormal pap.  Has a history of intermittent headaches.  Stress triggers.  Seeing Dr Sherryll BurgerShah. Headaches are better. Has imitrex if needed.  She also has GERD.  On omeprazole.  No chest pain or tightness.  Breathing stable.  Has high blood pressure.  Diagnosed after her last child was born.  She also has a history of cardiac surgery secondary to a birth defect.  Sees Dr Gwen PoundsKowalski.   Also saw Dr Sherryll BurgerShah for her hand pain.  Wore a splint.  Told had CTS.  Saw ortho today.  She is also having problems with her right shoulder now.  Mentioned to see ortho.  States needed a f/u appt to discuss her shoulder.  Increased pain with full extension and reaching around to her back.  PT helped some.     Past Medical History  Diagnosis Date  . GERD (gastroesophageal reflux disease)   . Hypertension   . Hypercholesteremia   . Perimenopausal 2014  . Migraine     Outpatient Encounter Prescriptions as of 05/30/2014  Medication Sig  . aspirin 81 MG tablet Take 81 mg by mouth daily.  Marland Kitchen. atorvastatin (LIPITOR) 20 MG tablet TAKE ONE TABLET BY MOUTH ONE TIME DAILY   . ferrous sulfate 325 (65 FE) MG EC tablet Take 325 mg by mouth daily with breakfast.  . losartan-hydrochlorothiazide (HYZAAR) 100-25 MG per tablet Take 1 tablet by mouth daily.  . Magnesium 400 MG TABS Take by mouth daily.  . Multiple Vitamin (MULTIVITAMIN) capsule Take 1 capsule by mouth daily.  Marland Kitchen. omeprazole (PRILOSEC) 20 MG capsule Take 20 mg by mouth daily.    Review of Systems Patient denies any significant problems with headache.  No  lightheadedness or dizziness.  Has a history of headaches.  Better.  No chest pain, tightness or palpitations.  No increased shortness of breath, cough or congestion.  No nausea or vomiting.  On omeprazole for acid reflux.   No abdominal pain or cramping.  No bowel change, such as diarrhea, constipation, BRBPR or melana.  No urine change.   Overall feels things are stable.  CTS R> L.  Right shoulder issues as outlined.       Objective:   Physical Exam  Filed Vitals:   05/30/14 1435  BP: 124/80  Pulse: 80  Temp: 97.9 F (36.6 C)   Blood pressure recheck:  70136/2580  53 year old female in no acute distress.   HEENT:  Nares- clear.  Oropharynx - without lesions. NECK:  Supple.  Nontender.  No audible bruit.  HEART:  Appears to be regular. LUNGS:  No crackles or wheezing audible.  Respirations even and unlabored.  RADIAL PULSE:  Equal bilaterally.   ABDOMEN:  Soft, nontender.  Bowel sounds present and normal.  No audible abdominal bruit.  EXTREMITIES:  No increased edema present.  DP pulses palpable and equal bilaterally.  MSK:  Increased discomfort in her right shoulder with full extension of her right arm and with reaching around her body.  Assessment & Plan:  HEALTH MAINTENANCE.  Gets her breast, pelvic and pap smears through Dr Hiram Comberosenows office.  Up to date.   Mammogram 6/15 - ok (per pt).   Had colonoscopy 2014.  (05/28/13 - normal).  Recommended repeat colonoscopy in 10 years.   I spent 25 minutes with the patient and more than 50% of the time was spent in consultation regarding the above.

## 2014-06-08 NOTE — Assessment & Plan Note (Signed)
Increased pain in right shoulder.  PT helped.  Given persistent pain, refer to ortho for further evaluation and treatment.

## 2014-06-08 NOTE — Assessment & Plan Note (Signed)
Saw Dr Sherryll BurgerShah.  Taking magnesium now.  Has imitrex if needed.  Follow.  Not as bad.

## 2014-06-08 NOTE — Assessment & Plan Note (Signed)
On atorvastatin.  Low cholesterol diet.  Follow lipid panel and liver function.  03/11/14 cholesterol panel revealed increased triglycerides.  We have discussed diet and exercise.  Low carb diet.

## 2014-06-16 NOTE — Telephone Encounter (Signed)
Unread mychart message mailed to patient 

## 2014-06-23 ENCOUNTER — Encounter: Payer: Self-pay | Admitting: Internal Medicine

## 2014-06-23 ENCOUNTER — Other Ambulatory Visit: Payer: Self-pay | Admitting: Internal Medicine

## 2014-07-15 ENCOUNTER — Ambulatory Visit: Payer: BC Managed Care – PPO | Admitting: Internal Medicine

## 2014-07-21 ENCOUNTER — Other Ambulatory Visit: Payer: Self-pay | Admitting: Internal Medicine

## 2014-09-01 ENCOUNTER — Telehealth: Payer: Self-pay | Admitting: Internal Medicine

## 2014-09-01 ENCOUNTER — Ambulatory Visit: Payer: BC Managed Care – PPO | Admitting: Internal Medicine

## 2014-09-01 NOTE — Telephone Encounter (Signed)
I notified Melissa that it was okay to remove her from the schedule since we have time to fill that slot this afternoon.

## 2014-09-01 NOTE — Telephone Encounter (Signed)
Pt called at 9:43 to cancel appt for today due to having to work over. Pt has rescheduled appt. Please advise to cancel off schedule for today.msn

## 2014-10-16 ENCOUNTER — Ambulatory Visit: Payer: BC Managed Care – PPO | Admitting: Internal Medicine

## 2014-12-01 ENCOUNTER — Encounter: Payer: Self-pay | Admitting: Nurse Practitioner

## 2014-12-01 ENCOUNTER — Ambulatory Visit (INDEPENDENT_AMBULATORY_CARE_PROVIDER_SITE_OTHER): Payer: BC Managed Care – PPO | Admitting: Nurse Practitioner

## 2014-12-01 ENCOUNTER — Ambulatory Visit
Admit: 2014-12-01 | Disposition: A | Discharge status: Home / Self Care | Payer: Self-pay | Attending: Nurse Practitioner | Admitting: Nurse Practitioner

## 2014-12-01 VITALS — BP 122/78 | HR 114 | Temp 98.6°F | Resp 14 | Ht 61.0 in | Wt 141.0 lb

## 2014-12-01 DIAGNOSIS — L03116 Cellulitis of left lower limb: Secondary | ICD-10-CM

## 2014-12-01 DIAGNOSIS — M7989 Other specified soft tissue disorders: Secondary | ICD-10-CM

## 2014-12-01 MED ORDER — CLINDAMYCIN HCL 300 MG PO CAPS: 300.0000 mg | ORAL_CAPSULE | Freq: Two times a day (BID) | ORAL | Status: DC

## 2014-12-01 NOTE — Progress Notes (Signed)
Pre visit review using our clinic review tool, if applicable. No additional management support is needed unless otherwise documented below in the visit note. 

## 2014-12-01 NOTE — Patient Instructions (Signed)
Please see Erie NoeVanessa for Ultrasound of leg.   Start antibiotics today.   Call us in 48 hours and let us know if it worsens.

## 2014-12-01 NOTE — Progress Notes (Signed)
Subjective:    Patient ID: Deborah Bartlett, female    DOB: 1960/11/30, 54 y.o.   MRN: [id number]  HPI  Deborah Bartlett is a 54 yo female with a CC of left leg swelling since Saturday.   1) Came home last Thursday from work w/ chills, vomiting, fatigue, headache. Stopped throwing up on Friday morning.  Husband did not have these symptoms. Saturday noticed left lower leg was red and swelling. Increasingly more red today she reports. Husband says it has not progressed upwards any further.   Getting painful- feels like worst sunburn she reports Still having headaches- generalized headaches  Redness/swelling of leg  Redness extends almost to knee   Review of Systems  Constitutional: Negative for fever, chills, diaphoresis and fatigue.  Respiratory: Negative for chest tightness, shortness of breath and wheezing.   Cardiovascular: Negative for chest pain, palpitations and leg swelling.  Gastrointestinal: Negative for nausea, vomiting, diarrhea and rectal pain.  Skin: Negative for rash.  Neurological: Negative for dizziness, weakness, numbness and headaches.  Psychiatric/Behavioral: The patient is not nervous/anxious.    Past Medical History  Diagnosis Date  . GERD (gastroesophageal reflux disease)   . Hypertension   . Hypercholesteremia   . Perimenopausal 2014  . Migraine     History   Social History  . Marital Status: Married    Spouse Name: N/A  . Number of Children: N/A  . Years of Education: N/A   Occupational History  . Not on file.   Social History Main Topics  . Smoking status: Never Smoker   . Smokeless tobacco: Never Used  . Alcohol Use: No  . Drug Use: No  . Sexual Activity: Not on file   Other Topics Concern  . Not on file   Social History Narrative    Past Surgical History  Procedure Laterality Date  . Cesarean section  1990, 1993, 1999  . 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  Hypertension Mother   . Diabetes Mother   . Breast cancer Neg Hx   . Colon cancer Neg Hx     Allergies  Allergen Reactions  . Codeine Palpitations    dizzy    Current Outpatient Prescriptions on File Prior to Visit  Medication Sig Dispense Refill  . aspirin 81 MG tablet Take 81 mg by mouth daily.    Marland Kitchen. atorvastatin (LIPITOR) 20 MG tablet TAKE ONE TABLET BY MOUTH ONE TIME DAILY  30 tablet 5  . ferrous sulfate 325 (65 FE) MG EC tablet Take 325 mg by mouth daily with breakfast.    . losartan-hydrochlorothiazide (HYZAAR) 100-25 MG per tablet TAKE ONE TABLET BY MOUTH ONE TIME DAILY  30 tablet 4  . Magnesium 400 MG TABS Take by mouth daily.    . Multiple Vitamin (MULTIVITAMIN) capsule Take 1 capsule by mouth daily.    Marland Kitchen. omeprazole (PRILOSEC) 20 MG capsule Take 20 mg by mouth daily.     No current facility-administered medications on file prior to visit.      Objective:   Physical Exam  Constitutional: She is oriented to person, place, and time. She appears well-developed and well-nourished. No distress.  BP 122/78 mmHg  Pulse 114  Temp(Src) 98.6 F (37 C) (Oral)  Resp 14  Ht 5\' 1"  (1.549 m)  Wt 141 lb (63.957 kg)  BMI 26.66 kg/m2  SpO2 99%   HENT:  Head: Normocephalic and  atraumatic.  Right Ear: External ear normal.  Left Ear: External ear normal.  Mouth/Throat: No oropharyngeal exudate.  Cardiovascular: Normal rate and regular rhythm.   Pulmonary/Chest: Effort normal and breath sounds normal.  Musculoskeletal: Normal range of motion. She exhibits edema and tenderness.  Redness from toes to 1/2 inch from knee, markedly swollen.   Neurological: She is alert and oriented to person, place, and time.  Skin: Skin is warm and dry. No rash noted. She is not diaphoretic.  Psychiatric: She has a normal mood and affect. Her behavior is normal. Judgment and thought content normal.      Assessment & Plan:

## 2014-12-03 ENCOUNTER — Telehealth: Payer: Self-pay

## 2014-12-03 NOTE — Telephone Encounter (Signed)
Pt called and said foot feels better but it is still red, sore and cannot wear her shoe.  She would like to know what she should do next.  Please advise.

## 2014-12-03 NOTE — Telephone Encounter (Signed)
As long as it is not getting worse, keep doing what you are doing. We will continue to check in.

## 2014-12-03 NOTE — Telephone Encounter (Signed)
Pt would like to request another note to stay off of work until 4/18.  Foot is still too swollen to put her a shoe on.  Will send a family member to pick up by Friday.

## 2014-12-04 ENCOUNTER — Encounter: Payer: Self-pay | Admitting: Internal Medicine

## 2014-12-04 ENCOUNTER — Inpatient Hospital Stay
Admit: 2014-12-04 | Disposition: A | Discharge status: Home / Self Care | Payer: Self-pay | Attending: Internal Medicine | Admitting: Internal Medicine

## 2014-12-04 ENCOUNTER — Telehealth: Payer: Self-pay | Admitting: Internal Medicine

## 2014-12-04 ENCOUNTER — Encounter: Payer: Self-pay | Admitting: Nurse Practitioner

## 2014-12-04 ENCOUNTER — Ambulatory Visit (INDEPENDENT_AMBULATORY_CARE_PROVIDER_SITE_OTHER): Payer: BC Managed Care – PPO | Admitting: Internal Medicine

## 2014-12-04 ENCOUNTER — Other Ambulatory Visit: Payer: Self-pay | Admitting: Nurse Practitioner

## 2014-12-04 VITALS — BP 120/70 | HR 100 | Temp 97.9°F | Ht 61.0 in | Wt 143.0 lb

## 2014-12-04 DIAGNOSIS — R519 Headache, unspecified: Secondary | ICD-10-CM

## 2014-12-04 DIAGNOSIS — L03116 Cellulitis of left lower limb: Secondary | ICD-10-CM

## 2014-12-04 DIAGNOSIS — R51 Headache: Secondary | ICD-10-CM

## 2014-12-04 LAB — CBC WITH DIFFERENTIAL/PLATELET
Basophil #: 0.1 10*3/uL (ref 0.0–0.1)
Basophil %: 0.7 %
Eosinophil #: 0.2 10*3/uL (ref 0.0–0.7)
Eosinophil %: 2.8 %
HCT: 40.1 % (ref 35.0–47.0)
HGB: 13.3 g/dL (ref 12.0–16.0)
Lymphocyte #: 2.1 10*3/uL (ref 1.0–3.6)
Lymphocyte %: 24.2 %
MCH: 28.7 pg (ref 26.0–34.0)
MCHC: 33.3 g/dL (ref 32.0–36.0)
MCV: 86 fL (ref 80–100)
Monocyte #: 0.7 x10 3/mm (ref 0.2–0.9)
Monocyte %: 7.9 %
Neutrophil #: 5.5 10*3/uL (ref 1.4–6.5)
Neutrophil %: 64.4 %
Platelet: 390 10*3/uL (ref 150–440)
RBC: 4.66 10*6/uL (ref 3.80–5.20)
RDW: 13.8 % (ref 11.5–14.5)
WBC: 8.6 10*3/uL (ref 3.6–11.0)

## 2014-12-04 LAB — BASIC METABOLIC PANEL
Anion Gap: 10 (ref 7–16)
BUN: 17 mg/dL
Calcium, Total: 9.8 mg/dL
Chloride: 102 mmol/L
Co2: 30 mmol/L
Creatinine: 0.72 mg/dL
EGFR (African American): >60
EGFR (Non-African Amer.): >60
Glucose: 95 mg/dL
Potassium: 3 mmol/L — ABNORMAL LOW
Sodium: 142 mmol/L

## 2014-12-04 NOTE — Telephone Encounter (Signed)
FYI; pt offered appoint  on4.19.16, pt denied appoint.

## 2014-12-04 NOTE — Progress Notes (Signed)
Pre visit review using our clinic review tool, if applicable. No additional management support is needed unless otherwise documented below in the visit note. 

## 2014-12-04 NOTE — Telephone Encounter (Signed)
If no better, needs to come in for evaluation.  I can work her in this pm, just tell her to come on in now and will work her in.  Thanks

## 2014-12-04 NOTE — Telephone Encounter (Signed)
[facility] - [place] TELEPHONE ADVICE RECORD [facility] Patient Name: Deborah Bartlett DOB: 1961-01-04 Initial Comment caller states she has cellulitis in her leg and its not getting better Nurse Assessment Nurse: Chrys RacerKoenig, RN, Alexia FreestoneAnna Marie Date/Time Lamount Cohen(Eastern Time): 12/04/2014 9:58:58 AM Confirm and document reason for call. If symptomatic, describe symptoms. ---caller states she has cellulitis ( front and back between knee and ankle) in her left leg and its not getting better. Started late Friday night. Leg swollen and red on Sat. Was seen on Monday at [facility], started pt on Clindamycin. Pt does not describe s/s DVT/ neg. Homans when asked to do the manuevers to demonstrate that. No bug bites visible. Afebrile. Has the patient traveled out of the country within the last 30 days? ---No Does the patient require triage? ---Yes Related visit to physician within the last 2 weeks? ---Yes Does the PT have any chronic conditions? (i.e. diabetes, asthma, etc.) ---Yes List chronic conditions. ---high blood pressure, high cholestral, takes OTC Prilosec Did the patient indicate they were pregnant? ---No Guidelines Guideline Title Affirmed Question Affirmed Notes Infection on Antibiotic Follow-up Call [1] Taking antibiotic < 72 hours (3 days) AND [2] symptoms are SAME (not improved) (all triage questions negative) Final Disposition [facility] [place], RN, Alexia Freestonenna Marie Comments Pt also describes doing "many hours of standing on it before I get to rest it". Also recommended incorporating more frequent intervals of resting and elevating extremity before standing for extended periods of time without elevating leg to help reduce swelling more effectively

## 2014-12-04 NOTE — Telephone Encounter (Signed)
Pt states that it is slightly better & she does not have a ride right now. But pretty sure she can get here before 4:30. Pt placed on schedule.

## 2014-12-05 ENCOUNTER — Telehealth: Payer: Self-pay

## 2014-12-05 LAB — BASIC METABOLIC PANEL
ANION GAP: 8 (ref 7–16)
BUN: 15 mg/dL
CHLORIDE: 104 mmol/L
CREATININE: 0.79 mg/dL
Calcium, Total: 8.9 mg/dL
Co2: 28 mmol/L
GLUCOSE: 121 mg/dL — AB
Potassium: 2.8 mmol/L — ABNORMAL LOW
Sodium: 140 mmol/L

## 2014-12-05 LAB — CBC WITH DIFFERENTIAL/PLATELET
BASOS ABS: 0 10*3/uL (ref 0.0–0.1)
Basophil %: 0.6 %
EOS PCT: 4.9 %
Eosinophil #: 0.3 10*3/uL (ref 0.0–0.7)
HCT: 34.5 % — AB (ref 35.0–47.0)
HGB: 11.3 g/dL — ABNORMAL LOW (ref 12.0–16.0)
LYMPHS ABS: 1.9 10*3/uL (ref 1.0–3.6)
LYMPHS PCT: 29.6 %
MCH: 28.5 pg (ref 26.0–34.0)
MCHC: 32.8 g/dL (ref 32.0–36.0)
MCV: 87 fL (ref 80–100)
MONOS PCT: 9.3 %
Monocyte #: 0.6 x10 3/mm (ref 0.2–0.9)
Neutrophil #: 3.5 10*3/uL (ref 1.4–6.5)
Neutrophil %: 55.6 %
PLATELETS: 319 10*3/uL (ref 150–440)
RBC: 3.97 10*6/uL (ref 3.80–5.20)
RDW: 14.3 % (ref 11.5–14.5)
WBC: 6.3 10*3/uL (ref 3.6–11.0)

## 2014-12-05 NOTE — Telephone Encounter (Signed)
Called and left message for patient to pick up excuse for work.

## 2014-12-06 LAB — CREATININE, SERUM
Creatinine: 0.81 mg/dL
EGFR (Non-African Amer.): >60

## 2014-12-06 LAB — POTASSIUM: Potassium: 3.8 mmol/L

## 2014-12-07 DIAGNOSIS — M7989 Other specified soft tissue disorders: Secondary | ICD-10-CM | POA: Insufficient documentation

## 2014-12-07 DIAGNOSIS — L0390 Cellulitis, unspecified: Secondary | ICD-10-CM | POA: Insufficient documentation

## 2014-12-07 NOTE — Assessment & Plan Note (Signed)
Stat venous lower left leg US to r/out DVT.

## 2014-12-07 NOTE — Assessment & Plan Note (Signed)
Probable cellulitis. No abscess or breach of skin found on exam. Will start pt on clindamycin 300 mg twice daily for 7 days. Encouraged probiotic OTC while on abx. Will call and check on pt in 24 hours, asked her to call in 48 hours. Note to miss work

## 2014-12-08 ENCOUNTER — Encounter: Payer: Self-pay | Admitting: Internal Medicine

## 2014-12-08 NOTE — Progress Notes (Signed)
Patient ID: Deborah Bartlett, female   DOB: Apr 30, 1961, 54 y.o.   MRN: [id number]   Subjective:    Patient ID: Deborah SquibbGina K Novak, female    DOB: Apr 30, 1961, 54 y.o.   MRN: [id number]  HPI  Patient here as a work in with concerns regarding persistent increased pain, swelling and redness in left leg.  She was seen 12/01/14 by Naomie Deanarrie Doss.  Was started on clindamycin.  Comes in today stating the leg is not much better.  Still with increased pain.  States feels like sunburn pain.  Swelling persists.  She has kept her leg elevated.  She reports that she was sick with aching, headache and vomiting - prior to the leg swelling. Leg then began swelling after that.  Progressed.  See Carrie's note for details.  Concerning that it has not improved.     Past Medical History  Diagnosis Date  . GERD (gastroesophageal reflux disease)   . Hypertension   . Hypercholesteremia   . Perimenopausal 2014  . Migraine     Current Outpatient Prescriptions on File Prior to Visit  Medication Sig Dispense Refill  . aspirin 81 MG tablet Take 81 mg by mouth daily.    Marland Kitchen. atorvastatin (LIPITOR) 20 MG tablet TAKE ONE TABLET BY MOUTH ONE TIME DAILY  30 tablet 5  . clindamycin (CLEOCIN) 300 MG capsule Take 1 capsule (300 mg total) by mouth 2 (two) times daily. 14 capsule 0  . ferrous sulfate 325 (65 FE) MG EC tablet Take 325 mg by mouth daily with breakfast.    . losartan-hydrochlorothiazide (HYZAAR) 100-25 MG per tablet TAKE ONE TABLET BY MOUTH ONE TIME DAILY  30 tablet 4  . Magnesium 400 MG TABS Take by mouth daily.    . Multiple Vitamin (MULTIVITAMIN) capsule Take 1 capsule by mouth daily.    Marland Kitchen. omeprazole (PRILOSEC) 20 MG capsule Take 20 mg by mouth daily.     No current facility-administered medications on file prior to visit.    Review of Systems  Constitutional: Negative for fever and chills.  HENT: Negative for congestion and sinus pressure.   Respiratory: Negative for cough and shortness of breath.   Cardiovascular:  Positive for leg swelling. Negative for palpitations.  Gastrointestinal: Negative for nausea, vomiting, abdominal pain and diarrhea.  Musculoskeletal:       Increased pain and swelling in left lower leg.  Increased/persistent redness.  Tight.    Skin: Positive for color change and rash.  Neurological: Negative for dizziness, light-headedness and headaches.       Objective:    Physical Exam  HENT:  Nose: Nose normal.  Mouth/Throat: Oropharynx is clear and moist.  Neck: Neck supple. No thyromegaly present.  Cardiovascular: Normal rate and regular rhythm.   Pulmonary/Chest: Breath sounds normal. No respiratory distress. She has no wheezes.  Abdominal: Soft. Bowel sounds are normal. There is no tenderness.  Musculoskeletal:  Increased swelling and redness left lower leg.  Increased pain to palpation.  Some discoloration (bruising) - left heel.    Lymphadenopathy:    She has no cervical adenopathy.    BP 120/70 mmHg  Pulse 100  Temp(Src) 97.9 F (36.6 C) (Oral)  Ht 5\' 1"  (1.549 m)  Wt 143 lb (64.864 kg)  BMI 27.03 kg/m2  SpO2 96% Wt Readings from Last 3 Encounters:  12/04/14 143 lb (64.864 kg)  12/01/14 141 lb (63.957 kg)  05/30/14 148 lb (67.132 kg)     Lab Results  Component Value Date   WBC  4.7 06/21/2013   HGB 13.2 06/21/2013   HCT 39.0 06/21/2013   PLT 302.0 06/21/2013   GLUCOSE 107* 03/11/2014   CHOL 197 03/11/2014   TRIG 264.0* 03/11/2014   HDL 49.40 03/11/2014   LDLDIRECT 124.2 03/11/2014   LDLCALC 107* 11/25/2013   ALT 27 03/11/2014   AST 24 03/11/2014   NA 140 03/11/2014   K 3.8 03/11/2014   CL 103 03/11/2014   CREATININE 0.8 03/11/2014   BUN 17 03/11/2014   CO2 29 03/11/2014   TSH 1.36 06/21/2013   HGBA1C 6.0* 05/30/2014       Assessment & Plan:   Problem List Items Addressed This Visit    Cellulitis - Primary    Persistent.  On clindamycin.  Not improved.  Recent ultrasound negative for DVT.  Discussed with the patient and her husband.  Will  admit to the hospital for further treatment.  Spoke to Dr Amado Coe and she agreed to accept the patient as a direct admission.  Pt and husband comfortable with this plan.        Headache    Not an issue now.  Follow.         I spent 25 minutes with the patient and more than 50% of the time was spent in consultation regarding the above.     Dale Chesterhill, MD

## 2014-12-08 NOTE — Assessment & Plan Note (Signed)
Not an issue now.  Follow.    

## 2014-12-08 NOTE — Assessment & Plan Note (Signed)
Persistent.  On clindamycin.  Not improved.  Recent ultrasound negative for DVT.  Discussed with the patient and her husband.  Will admit to the hospital for further treatment.  Spoke to Dr Amado CoeGouru and she agreed to accept the patient as a direct admission.  Pt and husband comfortable with this plan.

## 2014-12-09 ENCOUNTER — Telehealth: Payer: Self-pay | Admitting: *Deleted

## 2014-12-09 ENCOUNTER — Encounter: Payer: Self-pay | Admitting: Internal Medicine

## 2014-12-09 LAB — CULTURE, BLOOD (SINGLE)

## 2014-12-09 NOTE — Telephone Encounter (Signed)
letter placed up front & pt notified

## 2014-12-09 NOTE — Telephone Encounter (Signed)
Pt called, left message requesting another work note through the end of this week.  Pt states she is still having leg swelling.  Pt seen on 4.11.16 by Lyla Sonarrie and 4.14.16 by Dr Lorin PicketScott, current work note was until 4.18.16.  Has f/u appoint on 4.22.16. Please advise

## 2014-12-09 NOTE — Telephone Encounter (Signed)
Letter printed.  In basket.

## 2014-12-12 ENCOUNTER — Encounter: Payer: Self-pay | Admitting: Internal Medicine

## 2014-12-12 ENCOUNTER — Ambulatory Visit (INDEPENDENT_AMBULATORY_CARE_PROVIDER_SITE_OTHER): Payer: BC Managed Care – PPO | Admitting: Internal Medicine

## 2014-12-12 VITALS — BP 110/68 | HR 94 | Temp 97.9°F | Resp 14 | Ht 61.0 in | Wt 143.4 lb

## 2014-12-12 DIAGNOSIS — M7989 Other specified soft tissue disorders: Secondary | ICD-10-CM | POA: Diagnosis not present

## 2014-12-12 DIAGNOSIS — L03116 Cellulitis of left lower limb: Secondary | ICD-10-CM | POA: Diagnosis not present

## 2014-12-12 DIAGNOSIS — I10 Essential (primary) hypertension: Secondary | ICD-10-CM

## 2014-12-12 NOTE — Progress Notes (Signed)
Pre visit review using our clinic review tool, if applicable. No additional management support is needed unless otherwise documented below in the visit note. 

## 2014-12-14 ENCOUNTER — Encounter: Payer: Self-pay | Admitting: Internal Medicine

## 2014-12-14 NOTE — Progress Notes (Signed)
Patient ID: RMANI KAPUSTA, female   DOB: 03/30/1961, 54 y.o.   MRN: [id number]   Subjective:    Patient ID: Daleen Squibb, female    DOB: 1961-05-01, 54 y.o.   MRN: [id number]  HPI  Patient here for hospital follow up.  She was admitted 12/04/14 with left leg cellulitis after failing outpatient therapy.  She received IV vancomycin and rocephin initially in the hospital.  This was changed to IV vancomycin.  Blood cultures remained negative.  She was discharged home on sulfa.  Let is better.  Redness is better.  Still some swelling of her foot and lower leg.  Ultrasound was negative for DVT.  Skin is peeling.  She has not been on her feet.  Has kept them elevated.  Eating and drinking ok.  Tolerating the abx - ok.  No diarrhea.  Taking probiotic.    Past Medical History  Diagnosis Date  . GERD (gastroesophageal reflux disease)   . Hypertension   . Hypercholesteremia   . Perimenopausal 2014  . Migraine     Outpatient Encounter Prescriptions as of 12/12/2014  Medication Sig  . aspirin 81 MG tablet Take 81 mg by mouth daily.  Marland Kitchen atorvastatin (LIPITOR) 20 MG tablet TAKE ONE TABLET BY MOUTH ONE TIME DAILY   . ferrous sulfate 325 (65 FE) MG EC tablet Take 325 mg by mouth daily with breakfast.  . losartan-hydrochlorothiazide (HYZAAR) 100-25 MG per tablet TAKE ONE TABLET BY MOUTH ONE TIME DAILY   . Magnesium 400 MG TABS Take by mouth daily.  . Multiple Vitamin (MULTIVITAMIN) capsule Take 1 capsule by mouth daily.  Marland Kitchen omeprazole (PRILOSEC) 20 MG capsule Take 20 mg by mouth daily.  Marland Kitchen sulfamethoxazole-trimethoprim (BACTRIM DS,SEPTRA DS) 800-160 MG per tablet Take 1 tablet by mouth 2 (two) times daily.   . [DISCONTINUED] clindamycin (CLEOCIN) 300 MG capsule Take 1 capsule (300 mg total) by mouth 2 (two) times daily.    Review of Systems  Constitutional: Positive for activity change (not doing  a lot of weight bearing. ). Negative for unexpected weight change.  HENT: Negative for congestion.     Respiratory: Negative for chest tightness and shortness of breath.   Cardiovascular: Positive for leg swelling (left foot and lower leg with swelling - improved.  ). Negative for chest pain.  Gastrointestinal: Negative for vomiting and diarrhea.  Musculoskeletal:       Still with increased pain with palpation - foot, ankle.         Objective:    Physical Exam  Constitutional: She appears well-developed and well-nourished. No distress.  Neck: Neck supple.  Cardiovascular: Normal rate and regular rhythm.   Pulmonary/Chest: Effort normal and breath sounds normal. No respiratory distress.  Abdominal: Soft. There is no tenderness.  Musculoskeletal: She exhibits edema.  Still with some pedal and ankle edema.  Improved.  No increased erythema - improved significantly.  Skin - peeling.    Lymphadenopathy:    She has no cervical adenopathy.    BP 110/68 mmHg  Pulse 94  Temp(Src) 97.9 F (36.6 C) (Oral)  Resp 14  Ht  (1.549 m)  Wt 143 lb 6.4 oz (65.046 kg)  BMI 27.11 kg/m2  SpO2 97% Wt Readings from Last 3 Encounters:  12/12/14 143 lb 6.4 oz (65.046 kg)  12/04/14 143 lb (64.864 kg)  12/01/14 141 lb (63.957 kg)     Lab Results  Component Value Date   WBC 4.7 06/21/2013   HGB 13.2 06/21/2013  HCT 39.0 06/21/2013   PLT 302.0 06/21/2013   GLUCOSE 107* 03/11/2014   CHOL 197 03/11/2014   TRIG 264.0* 03/11/2014   HDL 49.40 03/11/2014   LDLDIRECT 124.2 03/11/2014   LDLCALC 107* 11/25/2013   ALT 27 03/11/2014   AST 24 03/11/2014   NA 140 03/11/2014   K 3.8 03/11/2014   CL 103 03/11/2014   CREATININE 0.8 03/11/2014   BUN 17 03/11/2014   CO2 29 03/11/2014   TSH 1.36 06/21/2013   HGBA1C 6.0* 05/30/2014       Assessment & Plan:   Problem List Items Addressed This Visit    Cellulitis - Primary    Better.  Still on abx.  Still with increased pain and swelling.  I recommend her remain out of work for another week.  Will reassess at that time.  Hopefully can gradually  get her back into her regular work schedule.  She will have to stand a lot when she returns.        Essential hypertension, benign    Blood pressure doing well.  Follow.        Left leg swelling    Recent ultrasound negative for DVT.  Still with increased swelling and tenderness.  Remain out of work for another week as outlined.  She is elevating her leg when sitting.  Complete abx.  Refer to vascular surgery for further evaluation and treatment.  DP pulses 2 plus bilaterally.       Relevant Orders   Ambulatory referral to Vascular Surgery     I spent 25 minutes with the patient and more than 50% of the time was spent in consultation regarding the above.     Dale DurhamSCOTT, Hindy Perrault, MD

## 2014-12-14 NOTE — Assessment & Plan Note (Signed)
Blood pressure doing well.  Follow.  

## 2014-12-14 NOTE — Assessment & Plan Note (Signed)
Recent ultrasound negative for DVT.  Still with increased swelling and tenderness.  Remain out of work for another week as outlined.  She is elevating her leg when sitting.  Complete abx.  Refer to vascular surgery for further evaluation and treatment.  DP pulses 2 plus bilaterally.

## 2014-12-14 NOTE — Assessment & Plan Note (Signed)
Better.  Still on abx.  Still with increased pain and swelling.  I recommend her remain out of work for another week.  Will reassess at that time.  Hopefully can gradually get her back into her regular work schedule.  She will have to stand a lot when she returns.

## 2014-12-19 ENCOUNTER — Encounter: Payer: Self-pay | Admitting: Internal Medicine

## 2014-12-19 ENCOUNTER — Ambulatory Visit (INDEPENDENT_AMBULATORY_CARE_PROVIDER_SITE_OTHER): Payer: BC Managed Care – PPO | Admitting: Internal Medicine

## 2014-12-19 VITALS — BP 120/80 | HR 109 | Temp 98.0°F | Ht 61.0 in | Wt 145.5 lb

## 2014-12-19 DIAGNOSIS — L03116 Cellulitis of left lower limb: Secondary | ICD-10-CM | POA: Diagnosis not present

## 2014-12-19 DIAGNOSIS — M7989 Other specified soft tissue disorders: Secondary | ICD-10-CM | POA: Diagnosis not present

## 2014-12-19 NOTE — Progress Notes (Signed)
Pre visit review using our clinic review tool, if applicable. No additional management support is needed unless otherwise documented below in the visit note. 

## 2014-12-21 ENCOUNTER — Encounter: Payer: Self-pay | Admitting: Internal Medicine

## 2014-12-21 NOTE — Discharge Summary (Signed)
PATIENT NAME:  Deborah Bartlett, Deborah Bartlett MR#:  [id number] DATE OF BIRTH:  August 28, 1960  DATE OF ADMISSION:  12/04/2014 DATE OF DISCHARGE:  12/06/2014  PRESENTING COMPLAINT: Redness in the left lower extremity.   DISCHARGE DIAGNOSES:  1.  Left lower extremity cellulitis, failed outpatient treatment.  2.  Hypertension.   CONDITION ON DISCHARGE: Fair.   CODE STATUS: Full code.   MEDICATIONS:  1.  Bactrim DS 1 tablet b.i.d.  2.  Acetaminophen 325 two tablets every 4 hours as needed.  3.  Omeprazole 20 mg daily.  4.  Multivitamin daily.  5.  Magnesium oxide 400 mg daily.  6.  Hydrochlorothiazide/losartan 25/100 one tablet daily.  7.  Ferrous sulfate 325 one tablet every other day.  8.  Atorvastatin 20 mg p.o. at bedtime.  9.  Aspirin 81 mg at bedtime.   DIET: Regular, low sodium.   FOLLOWUP:  With Dr. Lorin PicketScott in 1 to 2 weeks.   LABORATORY DATA:  1.  Blood cultures at discharge is negative. Ultrasound left lower extremity:  No evidence of DVT.  2.  White count at discharge is 6.3.   BRIEF SUMMARY OF HOSPITAL COURSE:  [name] is a 54 year old Caucasian female with hypertension, hyperlipidemia, came in with increasing redness in her left lower extremity. 1.  She was admitted with left lower extremity cellulitis. She was started on p.o. clindamycin as outpatient; however, her redness continued to increase.  Came to the Emergency Room, admitted, got IV vancomycin and IV Rocephin. Blood cultures remained negative. Rocephin was discontinued. The patient received vancomycin which was changed to p.o. Bactrim p.o. for outpatient treatment. Blood cultures remained negative.  2.  Hyperlipidemia on statins.  3.  Hypertension, resume home medications.   Hospital stay otherwise remained stable.  The patient remained a full code.   TIME SPENT: 40 minutes.   ____________________________ Wylie HailSona A. Allena KatzPatel, MD sap:sp D: 12/07/2014 06:44:55 ET T: 12/07/2014 10:33:47 ET JOB#: [id number]  cc: Akin Yi A. Allena KatzPatel, MD,  <Dictator> Willow OraSONA A Jevante Hollibaugh MD ELECTRONICALLY SIGNED 12/10/2014 10:34

## 2014-12-21 NOTE — H&P (Signed)
PATIENT NAME:  Deborah Bartlett, Deborah Bartlett MR#:  [id number] DATE OF BIRTH:  [birth year]  DATE OF ADMISSION:  12/04/2014  CHIEF COMPLAINT:  Left lower extremity redness.    HISTORY OF PRESENT ILLNESS:  The patient is a 54 year old pleasant Caucasian female sent into the hospital as a direct admission from Dr. Roby LoftsScott's clinic.  The patient was diagnosed with left lower extremity cellulitis.  She was treated with outpatient clindamycin with no significant improvement.  Her redness is getting worse.  On Monday she had pain and worsening of the swelling, so left lower extremity venous Doppler studies were done.  DVT was ruled out. Today she went to see Dr. Lorin PicketScott and she was sent over to the hospital as a direct admission. During my examination, the patient is resting comfortably.  Denies any chest pain or shortness of breath.  No fevers, no other complaints.   PAST MEDICAL HISTORY:  History of hypertension, hyperlipidemia, migraine headaches, GERD, and hyperglycemia.   PAST SURGICAL HISTORY:  Three C-sections, congenital birth defect surgery.      ALLERGIES:  CODEINE AND CHOCOLATE.   PSYCHOSOCIAL HISTORY:  Lives at home with husband.  No history of smoking, alcohol or illicit drug usage.   FAMILY HISTORY:  Hypertension runs in her family.   HOME MEDICATIONS:  Aspirin 81 mg once daily, atorvastatin 20 mg p.o. once daily at bedtime, iron sulfate 325 mg p.o. once daily with breakfast, Hyzaar 100/25 one tablet p.o. once daily, magnesium 400 mg p.o. once daily, multivitamin 1 tablet p.o. once daily.   REVIEW OF SYSTEMS:   CONSTITUTIONAL:  Denies any fever or fatigue.  EYES:  Denies blurry vision, double vision.  EARS, NOSE, AND THROAT:  Denies epistaxis , denies cough, chronic obstructive pulmonary disease.  CARDIOVASCULAR:  No chest pain or palpitations.  GASTROINTESTINAL:  Denies nausea, vomiting, diarrhea, or hematemesis.  GENITOURINARY:  No dysuria or hematuria.  ENDOCRINE:  Denies polyuria, nocturia, thyroid  problems.   HEMATOLOGIC AND LYMPHATIC:  No anemia, easy bruising or bleeding.  INTEGUMENTARY:  No acne or rash.  Has left lower extremity cellulitis.  PSYCHIATRIC:  No anxiety or depression.   PHYSICAL EXAMINATION:  VITAL SIGNS: Temperature 98, pulse 82, respirations 18, blood pressure 127/85, pulse oximetry 98%.  GENERAL APPEARANCE:  Moderately built, obese.  HEENT:  Normocephalic, atraumatic.  Pupils are equally reacting to light and accommodation. No scleral icterus.  No conjunctival injection.  No sinus tenderness.  No postnasal drip.  Moist mucous membranes.  NECK:  Supple.  No JVD.  No thyromegaly.  No masses.   LUNGS: Clear to auscultation bilaterally.  No accessory muscle use and no anterior chest wall tenderness on palpation.  CARDIAC:  S1, S2 normal. Regular rate and rhythm.  No murmur.  GASTROINTESTINAL:  Soft.  Bowel sounds are positive in all four quadrants, obese, nontender, nondistended.  No masses felt.  NEUROLOGIC:  Awake, alert, oriented.  Cranial nerves II through XII are grossly intact.  Motor and sensory are intact.  Reflexes are 2+.  EXTREMITIES:  Left lower extremity erythematous and edematous.  Tenderness is present.  Right lower extremity intact.  No cyanosis.  No clubbing.  SKIN:  Warm to touch.  Normal turgor.    PSYCHOLOGICAL:   Normal mood and affect.  LABORATORY AND IMAGING STUDIES:  On 12/01/2014 left lower extremity venous Doppler showed no evidence of DVT.  CBC, BMP ordered which are pending at this time.  Blood cultures were also ordered results of which are pending.   ASSESSMENT  AND PLAN: 54 year old Caucasian female sent over to the hospital as a direct admission for worsening of the left lower extremity cellulitis failed on outpatient antibiotic clindamycin will be admitted with the following assessment and plan:  1.  Acute left lower extremity cellulitis, failed outpatient antibiotics, and we will admit her to medical/surgical floor.  Blood cultures and  wound cultures were ordered.  The patient will be on intravenous Rocephin and vancomycin.  We will provide pain medication on as needed basis. Followup on the wound cultures, and depending on the sensitivity, will narrow down the antibiotic coverage. If no improvement, we will consider infectious disease consult.  2.  Hypertension.  We will resume her home medication of Hyzaar and up-titrate on an as-needed basis.  3.  Hyperlipidemia.  Continue home medication, statin. 4.  History of gastroesophageal reflux disease.  We will continue omeprazole.   5.  History of migraine.  Will provide fioricet  as needed basis.  6.  We will provide gastrointestinal and deep vein thrombosis prophylaxis with ranitidine and heparin subcutaneously.  7.  Has a congenital birth defect, status post surgery.  The patient is asymptomatic.  She was seen by  Dr. Gwen Pounds in the past.   CODE STATUS:  She is full code.  Husband is the medical power of attorney.   PLAN OF CARE:  Discussed in detail with the patient and her daughter at bedside.  They both verbalized understanding of the plan.    TOTAL TIME SPENT ON ADMISSION:  50 minutes.      ____________________________ Ramonita Lab, MD [phone] D: 12/04/2014 19:22:17 ET T: 12/04/2014 22:05:20 ET JOB#: [id number]  cc: Ramonita Lab, MD, <Dictator> Hennie Duos. Lorin Picket, MD  Ramonita Lab MD ELECTRONICALLY SIGNED 12/08/2014 17:06

## 2014-12-21 NOTE — Progress Notes (Signed)
Patient ID: Daleen SquibbGina K Distler, female   DOB: [birth year], 54 y.o.   MRN: [id number]   Subjective:    Patient ID: Daleen SquibbGina K Storbeck, female    DOB: [birth year], 54 y.o.   MRN: [id number]  HPI  Patient here for a scheduled follow up.  Recently hospitalized with lower extremity cellulitis.  Has completed abx now.  Leg has improved.  Has been out of work.  Her left lower foot and leg have continued to swell.  Had issues prior to her cellulitis, but haworsened since the infection.  Increased swelling with weight bearing and when up on her feet.  Her work involves a lot of standing and sitting with legs hanging down.  Increased pain and discomfort with the swelling.  Saw vascular surgery yesterday.  Trying graduated compression hose.     Past Medical History  Diagnosis Date  . GERD (gastroesophageal reflux disease)   . Hypertension   . Hypercholesteremia   . Perimenopausal 2014  . Migraine     Current Outpatient Prescriptions on File Prior to Visit  Medication Sig Dispense Refill  . aspirin 81 MG tablet Take 81 mg by mouth daily.    Marland Kitchen. atorvastatin (LIPITOR) 20 MG tablet TAKE ONE TABLET BY MOUTH ONE TIME DAILY  30 tablet 5  . ferrous sulfate 325 (65 FE) MG EC tablet Take 325 mg by mouth daily with breakfast.    . losartan-hydrochlorothiazide (HYZAAR) 100-25 MG per tablet TAKE ONE TABLET BY MOUTH ONE TIME DAILY  30 tablet 4  . Magnesium 400 MG TABS Take by mouth daily.    . Multiple Vitamin (MULTIVITAMIN) capsule Take 1 capsule by mouth daily.    Marland Kitchen. omeprazole (PRILOSEC) 20 MG capsule Take 20 mg by mouth daily.     No current facility-administered medications on file prior to visit.    Review of Systems  Constitutional: Negative for appetite change and unexpected weight change.  HENT: Negative for congestion and sinus pressure.   Respiratory: Negative for cough, chest tightness and shortness of breath.   Cardiovascular: Positive for leg swelling (persistent swelling left foot and left lower leg.  no  increased erythema.  increased tenderness to palpation.  ). Negative for chest pain and palpitations.  Gastrointestinal: Negative for nausea, vomiting, abdominal pain and diarrhea.  Skin:       No erythema.  Skin peeling.         Objective:    Physical Exam  Constitutional: She appears well-developed and well-nourished. No distress.  Neck: Neck supple.  Cardiovascular: Normal rate and regular rhythm.   Pulmonary/Chest: Breath sounds normal. No respiratory distress. She has no wheezes.  Abdominal: Soft. Bowel sounds are normal. There is no tenderness.  Musculoskeletal: She exhibits edema. She exhibits no tenderness.  Persistent left foot and left lower extremity swelling.  No increased erythema or warmth.    Lymphadenopathy:    She has no cervical adenopathy.  Skin: Skin is dry. No rash noted. No erythema.    BP 120/80 mmHg  Pulse 109  Temp(Src) 98 F (36.7 C) (Oral)  Ht 5\' 1"  (1.549 m)  Wt 145 lb 8 oz (65.998 kg)  BMI 27.51 kg/m2  SpO2 95% Wt Readings from Last 3 Encounters:  12/19/14 145 lb 8 oz (65.998 kg)  12/12/14 143 lb 6.4 oz (65.046 kg)  12/04/14 143 lb (64.864 kg)     Lab Results  Component Value Date   WBC 6.3 12/05/2014   HGB 11.3* 12/05/2014   HCT 34.5* 12/05/2014  PLT 319 12/05/2014   GLUCOSE 107* 03/11/2014   CHOL 197 03/11/2014   TRIG 264.0* 03/11/2014   HDL 49.40 03/11/2014   LDLDIRECT 124.2 03/11/2014   LDLCALC 107* 11/25/2013   ALT 27 03/11/2014   AST 24 03/11/2014   NA 140 03/11/2014   K 3.8 03/11/2014   CL 103 03/11/2014   CREATININE 0.81 12/06/2014   BUN 15 12/05/2014   CO2 28 12/05/2014   TSH 1.36 06/21/2013   HGBA1C 6.0* 05/30/2014       Assessment & Plan:   Problem List Items Addressed This Visit    Cellulitis - Primary    Infection cleared.  Still with swelling and pain.  Will remain out of work until 12/24/14.  Then return for three hours a day for one week.  Will reassess on 5/11/6.  Hopefully can return to full time schedule  after this.        Left leg swelling    Increased swelling and pain - persistent.  Pain worse with the increased swelling.  Saw vascular surgery.  Recommended graduated compression hose.  Will try these at home over the next several days.  Return to work for three hours per day on 12/24/14.  Will reassess on 12/31/14.            Dale Providence, MD

## 2014-12-21 NOTE — Assessment & Plan Note (Signed)
Increased swelling and pain - persistent.  Pain worse with the increased swelling.  Saw vascular surgery.  Recommended graduated compression hose.  Will try these at home over the next several days.  Return to work for three hours per day on 12/24/14.  Will reassess on 12/31/14.

## 2014-12-21 NOTE — Assessment & Plan Note (Signed)
Infection cleared.  Still with swelling and pain.  Will remain out of work until 12/24/14.  Then return for three hours a day for one week.  Will reassess on 5/11/6.  Hopefully can return to full time schedule after this.

## 2014-12-23 ENCOUNTER — Encounter: Payer: Self-pay | Admitting: Internal Medicine

## 2014-12-31 ENCOUNTER — Encounter: Payer: Self-pay | Admitting: Internal Medicine

## 2014-12-31 ENCOUNTER — Ambulatory Visit (INDEPENDENT_AMBULATORY_CARE_PROVIDER_SITE_OTHER): Payer: BC Managed Care – PPO | Admitting: Internal Medicine

## 2014-12-31 VITALS — BP 125/82 | HR 93 | Temp 97.9°F | Ht 61.0 in | Wt 146.5 lb

## 2014-12-31 DIAGNOSIS — L03116 Cellulitis of left lower limb: Secondary | ICD-10-CM | POA: Diagnosis not present

## 2014-12-31 DIAGNOSIS — I10 Essential (primary) hypertension: Secondary | ICD-10-CM | POA: Diagnosis not present

## 2014-12-31 DIAGNOSIS — M7989 Other specified soft tissue disorders: Secondary | ICD-10-CM

## 2014-12-31 MED ORDER — LOSARTAN POTASSIUM-HCTZ 100-25 MG PO TABS: 1.0000 | ORAL_TABLET | Freq: Every day | ORAL | Status: DC

## 2014-12-31 MED ORDER — ATORVASTATIN CALCIUM 20 MG PO TABS: 20.0000 mg | ORAL_TABLET | Freq: Every day | ORAL | Status: DC

## 2014-12-31 NOTE — Progress Notes (Signed)
Patient ID: Deborah Bartlett, female   DOB: [birth year], 54 y.o.   MRN: [id number]   Subjective:    Patient ID: Deborah Bartlett, female    DOB: [birth year], 54 y.o.   MRN: [id number]  HPI  Patient here for a scheduled follow up.  I have been seeing her for f/u cellulitis.  Admitted recently.  Off abx.  Infection cleared.  Had persistent swelling.  Saw vascular surgery.  Wearing compression hose.  Swelling better.  She is working.  Only working part time and only sitting while working.  Has been doing relatively well with this regimen.     Past Medical History  Diagnosis Date  . GERD (gastroesophageal reflux disease)   . Hypertension   . Hypercholesteremia   . Perimenopausal 2014  . Migraine     Current Outpatient Prescriptions on File Prior to Visit  Medication Sig Dispense Refill  . aspirin 81 MG tablet Take 81 mg by mouth daily.    . ferrous sulfate 325 (65 FE) MG EC tablet Take 325 mg by mouth daily with breakfast.    . Magnesium 400 MG TABS Take by mouth daily.    . Multiple Vitamin (MULTIVITAMIN) capsule Take 1 capsule by mouth daily.    Marland Kitchen. omeprazole (PRILOSEC) 20 MG capsule Take 20 mg by mouth daily.     No current facility-administered medications on file prior to visit.    Review of Systems  Constitutional: Negative for appetite change and unexpected weight change.  HENT: Negative for congestion and sinus pressure.   Respiratory: Negative for cough, chest tightness and shortness of breath.   Cardiovascular: Positive for leg swelling (leg swelling is better.  wearing compression hose. ). Negative for chest pain and palpitations.  Gastrointestinal: Negative for nausea, vomiting and abdominal pain.  Neurological: Negative for dizziness and headaches.       Objective:    Physical Exam  Constitutional: She appears well-developed and well-nourished. No distress.  Neck: Neck supple. No thyromegaly present.  Cardiovascular: Normal rate and regular rhythm.   Pulmonary/Chest:  Breath sounds normal. No respiratory distress. She has no wheezes.  Abdominal: Soft. Bowel sounds are normal. There is no tenderness.  Musculoskeletal: She exhibits no tenderness.  Swelling improved.  DP pulse palpable bilaterally.   Lymphadenopathy:    She has no cervical adenopathy.  Skin: No rash noted. No erythema.    BP 125/82 mmHg  Pulse 93  Temp(Src) 97.9 F (36.6 C) (Oral)  Ht 5\' 1"  (1.549 m)  Wt 146 lb 8 oz (66.452 kg)  BMI 27.70 kg/m2  SpO2 98% Wt Readings from Last 3 Encounters:  12/31/14 146 lb 8 oz (66.452 kg)  12/19/14 145 lb 8 oz (65.998 kg)  12/12/14 143 lb 6.4 oz (65.046 kg)     Lab Results  Component Value Date   WBC 6.3 12/05/2014   HGB 11.3* 12/05/2014   HCT 34.5* 12/05/2014   PLT 319 12/05/2014   GLUCOSE 121* 12/05/2014   CHOL 197 03/11/2014   TRIG 264.0* 03/11/2014   HDL 49.40 03/11/2014   LDLDIRECT 124.2 03/11/2014   LDLCALC 107* 11/25/2013   ALT 27 03/11/2014   AST 24 03/11/2014   NA 140 12/05/2014   K 3.8 12/06/2014   CL 104 12/05/2014   CREATININE 0.81 12/06/2014   BUN 15 12/05/2014   CO2 28 12/05/2014   TSH 1.36 06/21/2013   HGBA1C 6.0* 05/30/2014       Assessment & Plan:   Problem List Items Addressed This Visit  Cellulitis - Primary    No evidence of infection now.  Swelling has improved.        Essential hypertension, benign    Blood pressure doing well.  Follow.        Relevant Medications   atorvastatin (LIPITOR) 20 MG tablet   losartan-hydrochlorothiazide (HYZAAR) 100-25 MG per tablet   Left leg swelling    Swelling better with compression hose.  Will have her return to work full time with 1/2 of that time sitting.  Follow.  Recheck as scheduled.  Hopefully will return to full schedule at that visit.            Dale DurhamSCOTT, Dannilynn Gallina, MD

## 2014-12-31 NOTE — Progress Notes (Signed)
Pre visit review using our clinic review tool, if applicable. No additional management support is needed unless otherwise documented below in the visit note. 

## 2015-01-04 ENCOUNTER — Encounter: Payer: Self-pay | Admitting: Internal Medicine

## 2015-01-04 NOTE — Assessment & Plan Note (Signed)
No evidence of infection now.  Swelling has improved.

## 2015-01-04 NOTE — Assessment & Plan Note (Signed)
Blood pressure doing well.  Follow.  

## 2015-01-04 NOTE — Assessment & Plan Note (Signed)
Swelling better with compression hose.  Will have her return to work full time with 1/2 of that time sitting.  Follow.  Recheck as scheduled.  Hopefully will return to full schedule at that visit.

## 2015-01-08 ENCOUNTER — Encounter: Payer: Self-pay | Admitting: Internal Medicine

## 2015-01-08 ENCOUNTER — Ambulatory Visit (INDEPENDENT_AMBULATORY_CARE_PROVIDER_SITE_OTHER): Payer: BC Managed Care – PPO | Admitting: Internal Medicine

## 2015-01-08 VITALS — BP 124/80 | HR 93 | Temp 98.0°F | Ht 61.0 in | Wt 147.0 lb

## 2015-01-08 DIAGNOSIS — I10 Essential (primary) hypertension: Secondary | ICD-10-CM

## 2015-01-08 DIAGNOSIS — M7989 Other specified soft tissue disorders: Secondary | ICD-10-CM | POA: Diagnosis not present

## 2015-01-08 DIAGNOSIS — L03116 Cellulitis of left lower limb: Secondary | ICD-10-CM

## 2015-01-08 NOTE — Progress Notes (Signed)
Pre visit review using our clinic review tool, if applicable. No additional management support is needed unless otherwise documented below in the visit note. 

## 2015-01-19 ENCOUNTER — Encounter: Payer: Self-pay | Admitting: Internal Medicine

## 2015-01-19 NOTE — Assessment & Plan Note (Addendum)
Swelling improved.  Wearing compression hose.  Will release her to go back to work full time and full work load.  Work note provided.  Follow.  Keep f/u with vascular surgery.

## 2015-01-19 NOTE — Assessment & Plan Note (Signed)
Blood pressure doing well on current regimen.

## 2015-01-19 NOTE — Progress Notes (Signed)
Patient ID: Deborah Bartlett, female   DOB: 09/19/1960, 54 y.o.   MRN: [id number]   Subjective:    Patient ID: Deborah SquibbGina K Bartlett, female    DOB: 09/19/1960, 54 y.o.   MRN: [id number]  HPI  Patient here for a scheduled follow up.  Was hospitalized recently with swelling and lower extremity cellulitis.  Treated with abx.  Infection cleared.  Swelling improved now.  Saw vascular surgery.  Planning f/u in July.  Wearing compression hose.  Have been gradually getting her back into her work schedule.  Overall doing better.    Past Medical History  Diagnosis Date  . GERD (gastroesophageal reflux disease)   . Hypertension   . Hypercholesteremia   . Perimenopausal 2014  . Migraine     Current Outpatient Prescriptions on File Prior to Visit  Medication Sig Dispense Refill  . aspirin 81 MG tablet Take 81 mg by mouth daily.    Marland Kitchen. atorvastatin (LIPITOR) 20 MG tablet Take 1 tablet (20 mg total) by mouth daily. 30 tablet 5  . ferrous sulfate 325 (65 FE) MG EC tablet Take 325 mg by mouth daily with breakfast.    . losartan-hydrochlorothiazide (HYZAAR) 100-25 MG per tablet Take 1 tablet by mouth daily. 30 tablet 5  . Magnesium 400 MG TABS Take by mouth daily.    . Multiple Vitamin (MULTIVITAMIN) capsule Take 1 capsule by mouth daily.    Marland Kitchen. omeprazole (PRILOSEC) 20 MG capsule Take 20 mg by mouth daily.     No current facility-administered medications on file prior to visit.    Review of Systems  Constitutional: Negative for appetite change and unexpected weight change.  Respiratory: Negative for cough, chest tightness and shortness of breath.   Cardiovascular: Negative for chest pain, palpitations and leg swelling.  Gastrointestinal: Negative for nausea, vomiting and diarrhea.  Musculoskeletal:       Swelling better.  Wearing compression hose.    Skin:       No erythema.        Objective:    Physical Exam  Constitutional: She appears well-developed and well-nourished. No distress.    Cardiovascular: Normal rate and regular rhythm.   Pulmonary/Chest: Breath sounds normal. No respiratory distress. She has no wheezes.  Abdominal: Soft. Bowel sounds are normal. There is no tenderness.  Musculoskeletal: She exhibits no tenderness.  Decreased edema.    Skin: No rash noted. No erythema.    BP 124/80 mmHg  Pulse 93  Temp(Src) 98 F (36.7 C) (Oral)  Ht 5\' 1"  (1.549 m)  Wt 147 lb (66.679 kg)  BMI 27.79 kg/m2  SpO2 98% Wt Readings from Last 3 Encounters:  01/08/15 147 lb (66.679 kg)  12/31/14 146 lb 8 oz (66.452 kg)  12/19/14 145 lb 8 oz (65.998 kg)     Lab Results  Component Value Date   WBC 6.3 12/05/2014   HGB 11.3* 12/05/2014   HCT 34.5* 12/05/2014   PLT 319 12/05/2014   GLUCOSE 121* 12/05/2014   CHOL 197 03/11/2014   TRIG 264.0* 03/11/2014   HDL 49.40 03/11/2014   LDLDIRECT 124.2 03/11/2014   LDLCALC 107* 11/25/2013   ALT 27 03/11/2014   AST 24 03/11/2014   NA 140 12/05/2014   K 3.8 12/06/2014   CL 104 12/05/2014   CREATININE 0.81 12/06/2014   BUN 15 12/05/2014   CO2 28 12/05/2014   TSH 1.36 06/21/2013   HGBA1C 6.0* 05/30/2014       Assessment & Plan:   Problem List Items  Addressed This Visit    Cellulitis    Resolved.        Essential hypertension, benign    Blood pressure doing well on current regimen.        Left leg swelling - Primary    Swelling improved.  Wearing compression hose.  Will release her to go back to work full time and full work load.  Work note provided.  Follow.  Keep f/u with vascular surgery.            Dale Galveston, MD

## 2015-01-19 NOTE — Assessment & Plan Note (Signed)
Resolved

## 2015-02-09 ENCOUNTER — Telehealth: Payer: Self-pay

## 2015-02-09 NOTE — Telephone Encounter (Signed)
LMTCB, regarding mammogram

## 2015-02-12 ENCOUNTER — Telehealth: Payer: Self-pay

## 2015-02-12 NOTE — Telephone Encounter (Signed)
The pt called hoping to return Melanie's phone call Callback - [phone]

## 2015-02-12 NOTE — Telephone Encounter (Signed)
Returned pts call, to ask about scheduling her mammogram. Gets mammograms through [facility], has last mammogram 01/2014. Is going to call to schedule an appt with them,

## 2015-02-14 ENCOUNTER — Encounter: Payer: Self-pay | Admitting: Internal Medicine

## 2015-02-24 ENCOUNTER — Ambulatory Visit (INDEPENDENT_AMBULATORY_CARE_PROVIDER_SITE_OTHER): Payer: BC Managed Care – PPO | Admitting: Internal Medicine

## 2015-02-24 ENCOUNTER — Telehealth: Payer: Self-pay | Admitting: Internal Medicine

## 2015-02-24 ENCOUNTER — Encounter: Payer: Self-pay | Admitting: Internal Medicine

## 2015-02-24 VITALS — BP 118/80 | HR 83 | Temp 97.9°F | Ht 61.0 in | Wt 150.1 lb

## 2015-02-24 DIAGNOSIS — M7989 Other specified soft tissue disorders: Secondary | ICD-10-CM

## 2015-02-24 DIAGNOSIS — L03116 Cellulitis of left lower limb: Secondary | ICD-10-CM

## 2015-02-24 MED ORDER — DOXYCYCLINE HYCLATE 100 MG PO TABS: 100.0000 mg | ORAL_TABLET | Freq: Two times a day (BID) | ORAL | Status: DC

## 2015-02-24 NOTE — Telephone Encounter (Signed)
See below

## 2015-02-24 NOTE — Progress Notes (Signed)
Patient ID: Deborah Bartlett, female   DOB: [birth year], 54 y.o.   MRN: [id number]   Subjective:    Patient ID: Deborah Bartlett, female    DOB: 1960/11/09, 54 y.o.   MRN: [id number]  HPI  Patient here as a work in with concerns regarding cellulitis.   She went on vacation.  Noticed some increased "splotches" of redness on her legs.  Some increased redness.  Left foot swells.  Worse at times.  She is not working now.  Off for the summer.  No fever.  No nausea or vomiting.     Past Medical History  Diagnosis Date  . GERD (gastroesophageal reflux disease)   . Hypertension   . Hypercholesteremia   . Perimenopausal 2014  . Migraine     Current Outpatient Prescriptions on File Prior to Visit  Medication Sig Dispense Refill  . aspirin 81 MG tablet Take 81 mg by mouth daily.    Marland Kitchen atorvastatin (LIPITOR) 20 MG tablet Take 1 tablet (20 mg total) by mouth daily. 30 tablet 5  . ferrous sulfate 325 (65 FE) MG EC tablet Take 325 mg by mouth daily with breakfast.    . losartan-hydrochlorothiazide (HYZAAR) 100-25 MG per tablet Take 1 tablet by mouth daily. 30 tablet 5  . Magnesium 400 MG TABS Take by mouth daily.    . Multiple Vitamin (MULTIVITAMIN) capsule Take 1 capsule by mouth daily.    Marland Kitchen omeprazole (PRILOSEC) 20 MG capsule Take 20 mg by mouth daily.     No current facility-administered medications on file prior to visit.    Review of Systems  Constitutional: Negative for fever and appetite change.  HENT: Negative for congestion and sinus pressure.   Respiratory: Negative for chest tightness and shortness of breath.   Cardiovascular: Positive for leg swelling (mostly - foot swelling.  ). Negative for chest pain and palpitations.  Gastrointestinal: Negative for nausea, vomiting and diarrhea.  Skin:       Increased erythema - foot and lower leg.  Increased scaling of her skin.    Neurological: Negative for dizziness, light-headedness and headaches.       Objective:    Physical Exam    Constitutional: No distress.  Neck: Neck supple.  Cardiovascular: Normal rate and regular rhythm.   Pulmonary/Chest: Breath sounds normal. No respiratory distress. She has no wheezes.  Abdominal: Soft. Bowel sounds are normal. There is no tenderness.  Musculoskeletal:  Increased swelling - left foot and lower left ankle.    Lymphadenopathy:    She has no cervical adenopathy.  Skin: There is erythema.  Scaling of her skin.  Skin dry.     BP 118/80 mmHg  Pulse 83  Temp(Src) 97.9 F (36.6 C) (Oral)  Ht  (1.549 m)  Wt 150 lb 2 oz (68.096 kg)  BMI 28.38 kg/m2  SpO2 97% Wt Readings from Last 3 Encounters:  02/24/15 150 lb 2 oz (68.096 kg)  01/08/15 147 lb (66.679 kg)  12/31/14 146 lb 8 oz (66.452 kg)     Lab Results  Component Value Date   WBC 6.3 12/05/2014   HGB 11.3* 12/05/2014   HCT 34.5* 12/05/2014   PLT 319 12/05/2014   GLUCOSE 121* 12/05/2014   CHOL 197 03/11/2014   TRIG 264.0* 03/11/2014   HDL 49.40 03/11/2014   LDLDIRECT 124.2 03/11/2014   LDLCALC 107* 11/25/2013   ALT 27 03/11/2014   AST 24 03/11/2014   NA 140 12/05/2014   K 3.8 12/06/2014   CL  104 12/05/2014   CREATININE 0.81 12/06/2014   BUN 15 12/05/2014   CO2 28 12/05/2014   TSH 1.36 06/21/2013   HGBA1C 6.0* 05/30/2014       Assessment & Plan:   Problem List Items Addressed This Visit    Cellulitis - Primary    Left lower extremity swelling and redness.  Skin changes as outlined. Elevate leg.  Doxycycline as directed.  See if we can get earlier appt with Dr Wyn Quakerew.  Have dermatology evaluate to help with dry skin, scaling skin, etc.        Relevant Orders   Ambulatory referral to Dermatology   Left leg swelling    Wear compression hose.  F/u with vascular surgery as outlined.        Relevant Orders   Ambulatory referral to Dermatology       Dale DurhamSCOTT, Fabiola Mudgett, MD

## 2015-02-24 NOTE — Telephone Encounter (Signed)
Pt called to reschedule an appt for cellulitis of leg and foot. Please advise where to add to the schedule/msn

## 2015-02-24 NOTE — Telephone Encounter (Signed)
I had previously been seeing her for cellulits.  She had called while on vacation and reported some problems with her leg.  What symptoms is she having now?  Is she on abx?  Any swelling?  Was she see at outside clinic?  If no acute issues, can have her come in the pm at 4:15 to be worked in for this problem.  Let me know if more acute issues (i.e., concern over clot, bad infection, etc).

## 2015-02-24 NOTE — Telephone Encounter (Signed)
C/O left leg swelling (no redness), not having any redness. Swelling goes down at night. Has not been seen anywhere else. Pt coming today at 4:15.

## 2015-02-24 NOTE — Progress Notes (Signed)
Pre visit review using our clinic review tool, if applicable. No additional management support is needed unless otherwise documented below in the visit note. 

## 2015-03-01 ENCOUNTER — Encounter: Payer: Self-pay | Admitting: Internal Medicine

## 2015-03-01 NOTE — Assessment & Plan Note (Signed)
Wear compression hose.  F/u with vascular surgery as outlined.

## 2015-03-01 NOTE — Assessment & Plan Note (Signed)
Left lower extremity swelling and redness.  Skin changes as outlined. Elevate leg.  Doxycycline as directed.  See if we can get earlier appt with Dr Wyn Quakerew.  Have dermatology evaluate to help with dry skin, scaling skin, etc.

## 2015-03-04 ENCOUNTER — Encounter: Payer: Self-pay | Admitting: *Deleted

## 2015-03-04 ENCOUNTER — Telehealth: Payer: Self-pay | Admitting: Internal Medicine

## 2015-03-04 NOTE — Telephone Encounter (Signed)
Please notify pt that I did contact [facility] and Dr Wyn Quakerew does not have an appt until august.  I kept the appt with Dr Lorretta HarpSchneir on the 7/28 - given her return to w/up will be coming up in august.  They did inform me they would put her on a cancellation list.  Thanks

## 2015-03-04 NOTE — Telephone Encounter (Signed)
Sent mychart message

## 2015-03-10 LAB — HM MAMMOGRAPHY: HM Mammogram: NEGATIVE

## 2015-03-10 LAB — HM PAP SMEAR: HM Pap smear: NORMAL

## 2015-03-10 NOTE — Telephone Encounter (Signed)
Unread mychart message mailed to patient 

## 2015-03-18 ENCOUNTER — Encounter: Payer: Self-pay | Admitting: *Deleted

## 2015-04-20 ENCOUNTER — Encounter: Payer: Self-pay | Admitting: Internal Medicine

## 2015-04-20 ENCOUNTER — Ambulatory Visit (INDEPENDENT_AMBULATORY_CARE_PROVIDER_SITE_OTHER): Payer: BC Managed Care – PPO | Admitting: Internal Medicine

## 2015-04-20 VITALS — BP 110/80 | HR 118 | Temp 97.9°F | Ht 61.0 in | Wt 152.2 lb

## 2015-04-20 DIAGNOSIS — M7989 Other specified soft tissue disorders: Secondary | ICD-10-CM

## 2015-04-20 DIAGNOSIS — I10 Essential (primary) hypertension: Secondary | ICD-10-CM | POA: Diagnosis not present

## 2015-04-20 DIAGNOSIS — K219 Gastro-esophageal reflux disease without esophagitis: Secondary | ICD-10-CM

## 2015-04-20 DIAGNOSIS — E7800 Pure hypercholesterolemia, unspecified: Secondary | ICD-10-CM

## 2015-04-20 DIAGNOSIS — R739 Hyperglycemia, unspecified: Secondary | ICD-10-CM | POA: Diagnosis not present

## 2015-04-20 NOTE — Progress Notes (Signed)
Patient ID: Deborah Bartlett, female   DOB: 10-04-1960, 54 y.o.   MRN: [id number]   Subjective:    Patient ID: Deborah Bartlett, female    DOB: 08-03-1961, 54 y.o.   MRN: [id number]  HPI  Patient here for a scheduled follow up.   Legs are doing better.  She is wearing her compression hose and using the pump.  Swelling much improved.  She feels better.  Working.  No sob.  Eating and drinking well.    Past Medical History  Diagnosis Date  . GERD (gastroesophageal reflux disease)   . Hypertension   . Hypercholesteremia   . Perimenopausal 2014  . Migraine    Past Surgical History  Procedure Laterality Date  . Cesarean section  1990, 1993, 1999  . 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 Hypertension Mother   . Diabetes Mother   . Breast cancer Neg Hx   . Colon cancer Neg Hx    Social History   Social History  . Marital Status: Married    Spouse Name: N/A  . Number of Children: N/A  . Years of Education: N/A   Social History Main Topics  . Smoking status: Never Smoker   . Smokeless tobacco: Never Used  . Alcohol Use: No  . Drug Use: No  . Sexual Activity: Not Asked   Other Topics Concern  . None   Social History Narrative    Outpatient Encounter Prescriptions as of 04/20/2015  Medication Sig  . aspirin 81 MG tablet Take 81 mg by mouth daily.  Marland Kitchen atorvastatin (LIPITOR) 20 MG tablet Take 1 tablet (20 mg total) by mouth daily.  Marland Kitchen losartan-hydrochlorothiazide (HYZAAR) 100-25 MG per tablet Take 1 tablet by mouth daily.  . Magnesium 400 MG TABS Take by mouth daily.  . Multiple Vitamin (MULTIVITAMIN) capsule Take 1 capsule by mouth daily.  Marland Kitchen omeprazole (PRILOSEC) 20 MG capsule Take 20 mg by mouth daily.  . [DISCONTINUED] doxycycline (VIBRA-TABS) 100 MG tablet Take 1 tablet (100 mg total) by mouth 2 (two) times daily.  . [DISCONTINUED] ferrous sulfate 325 (65 FE) MG EC tablet Take 325 mg by mouth daily with  breakfast.   No facility-administered encounter medications on file as of 04/20/2015.    Review of Systems  Constitutional: Negative for appetite change and unexpected weight change.  HENT: Negative for congestion and sinus pressure.   Respiratory: Negative for cough, chest tightness and shortness of breath.   Cardiovascular: Negative for chest pain, palpitations and leg swelling (leg swelling is better.  using her pump.  wearing compression hose.  ).  Gastrointestinal: Negative for nausea, vomiting and diarrhea.  Skin: Negative for color change and rash.  Neurological: Negative for dizziness, light-headedness and headaches.       Objective:     Blood pressure rechecked by me:  122/80  Physical Exam  Constitutional: She appears well-developed and well-nourished. No distress.  HENT:  Nose: Nose normal.  Mouth/Throat: Oropharynx is clear and moist.  Eyes: Conjunctivae are normal. Right eye exhibits no discharge. Left eye exhibits no discharge.  Neck: Neck supple. No thyromegaly present.  Cardiovascular: Normal rate and regular rhythm.   Pulmonary/Chest: Breath sounds normal. No respiratory distress. She has no wheezes.  Abdominal: Soft. Bowel sounds are normal. There is no tenderness.  Musculoskeletal: She exhibits no edema or tenderness.  Lymphadenopathy:    She has no cervical adenopathy.  Skin:  No rash noted. No erythema.  Psychiatric: She has a normal mood and affect. Her behavior is normal.    BP 110/80 mmHg  Pulse 118  Temp(Src) 97.9 F (36.6 C) (Oral)  Ht 5\' 1"  (1.549 m)  Wt 152 lb 4 oz (69.06 kg)  BMI 28.78 kg/m2  SpO2 97% Wt Readings from Last 3 Encounters:  04/20/15 152 lb 4 oz (69.06 kg)  02/24/15 150 lb 2 oz (68.096 kg)  01/08/15 147 lb (66.679 kg)     Lab Results  Component Value Date   WBC 6.3 12/05/2014   HGB 11.3* 12/05/2014   HCT 34.5* 12/05/2014   PLT 319 12/05/2014   GLUCOSE 121* 12/05/2014   CHOL 197 03/11/2014   TRIG 264.0* 03/11/2014    HDL 49.40 03/11/2014   LDLDIRECT 124.2 03/11/2014   LDLCALC 107* 11/25/2013   ALT 27 03/11/2014   AST 24 03/11/2014   NA 140 12/05/2014   K 3.8 12/06/2014   CL 104 12/05/2014   CREATININE 0.81 12/06/2014   BUN 15 12/05/2014   CO2 28 12/05/2014   TSH 1.36 06/21/2013   HGBA1C 6.0* 05/30/2014       Assessment & Plan:   Problem List Items Addressed This Visit    Essential hypertension, benign - Primary    Blood pressure under good control.  Continue same medication regimen.  Follow pressures.  Follow metabolic panel.        Relevant Orders   Comprehensive metabolic panel   GERD (gastroesophageal reflux disease)    On omeprazole.  Symptoms controlled.       Relevant Orders   CBC with Differential/Platelet   TSH   Hypercholesterolemia   Relevant Orders   Lipid panel   Left leg swelling    Saw vascular surgery.  Wearing compression hose.  Using the pump.  Swelling better.  Skin better.  Follow.        Other Visit Diagnoses    Hyperglycemia        Relevant Orders    Hemoglobin A1c        Dale Cutter, MD

## 2015-04-20 NOTE — Progress Notes (Signed)
Pre-visit discussion using our clinic review tool. No additional management support is needed unless otherwise documented below in the visit note.  

## 2015-04-22 ENCOUNTER — Encounter: Payer: Self-pay | Admitting: Internal Medicine

## 2015-04-27 ENCOUNTER — Encounter: Payer: Self-pay | Admitting: Internal Medicine

## 2015-04-27 NOTE — Assessment & Plan Note (Signed)
On omeprazole.  Symptoms controlled.  

## 2015-04-27 NOTE — Assessment & Plan Note (Signed)
Blood pressure under good control.  Continue same medication regimen.  Follow pressures.  Follow metabolic panel.   

## 2015-04-27 NOTE — Assessment & Plan Note (Signed)
Saw vascular surgery.  Wearing compression hose.  Using the pump.  Swelling better.  Skin better.  Follow.

## 2015-04-29 ENCOUNTER — Encounter: Payer: Self-pay | Admitting: Internal Medicine

## 2015-04-29 DIAGNOSIS — Z0000 Encounter for general adult medical examination without abnormal findings: Secondary | ICD-10-CM | POA: Insufficient documentation

## 2015-04-29 DIAGNOSIS — Z1211 Encounter for screening for malignant neoplasm of colon: Secondary | ICD-10-CM | POA: Insufficient documentation

## 2015-05-05 ENCOUNTER — Other Ambulatory Visit: Payer: BC Managed Care – PPO

## 2015-05-21 ENCOUNTER — Telehealth: Payer: Self-pay | Admitting: Internal Medicine

## 2015-05-21 DIAGNOSIS — M79646 Pain in unspecified finger(s): Secondary | ICD-10-CM

## 2015-05-21 NOTE — Telephone Encounter (Signed)
Spoke with the patient, would like the rheumatolgy referral.  Thanks

## 2015-05-21 NOTE — Telephone Encounter (Signed)
Pt called about her right thumb is still hurting her and she states it feels like it's ripping inside. Pt did purchase the brace for her hand/thumb pt states pain still seem to be worse. Pt wants to know can she be referred out or come in? Thank You!

## 2015-05-21 NOTE — Telephone Encounter (Signed)
Order placed for referral to rheumatology.  

## 2015-05-21 NOTE — Telephone Encounter (Signed)
Please advise 

## 2015-05-21 NOTE — Telephone Encounter (Signed)
I have seen her for this and if her hand is no better, I would recommend referral.  Does she have a preference?  I can refer to ortho or rheumatology (Dr Gavin Potters).  Let me know if preference.  I will place the order for the referral.

## 2015-07-10 ENCOUNTER — Other Ambulatory Visit: Payer: Self-pay | Admitting: Internal Medicine

## 2015-08-18 ENCOUNTER — Ambulatory Visit: Payer: BC Managed Care – PPO | Admitting: Internal Medicine

## 2015-09-28 ENCOUNTER — Ambulatory Visit: Payer: BC Managed Care – PPO | Admitting: Internal Medicine

## 2015-12-04 ENCOUNTER — Ambulatory Visit: Payer: BC Managed Care – PPO | Admitting: Internal Medicine

## 2015-12-19 ENCOUNTER — Other Ambulatory Visit: Payer: Self-pay | Admitting: Internal Medicine

## 2015-12-29 ENCOUNTER — Ambulatory Visit: Payer: BC Managed Care – PPO | Admitting: Internal Medicine

## 2016-01-06 ENCOUNTER — Encounter: Payer: Self-pay | Admitting: Internal Medicine

## 2016-01-06 ENCOUNTER — Ambulatory Visit (INDEPENDENT_AMBULATORY_CARE_PROVIDER_SITE_OTHER): Payer: BC Managed Care – PPO | Admitting: Internal Medicine

## 2016-01-06 ENCOUNTER — Telehealth: Payer: Self-pay | Admitting: Internal Medicine

## 2016-01-06 VITALS — BP 110/80 | HR 112 | Temp 98.2°F | Resp 18 | Ht 61.0 in | Wt 151.0 lb

## 2016-01-06 DIAGNOSIS — Z9109 Other allergy status, other than to drugs and biological substances: Secondary | ICD-10-CM

## 2016-01-06 DIAGNOSIS — E7800 Pure hypercholesterolemia, unspecified: Secondary | ICD-10-CM | POA: Diagnosis not present

## 2016-01-06 DIAGNOSIS — Z0000 Encounter for general adult medical examination without abnormal findings: Secondary | ICD-10-CM

## 2016-01-06 DIAGNOSIS — M7989 Other specified soft tissue disorders: Secondary | ICD-10-CM

## 2016-01-06 DIAGNOSIS — K219 Gastro-esophageal reflux disease without esophagitis: Secondary | ICD-10-CM | POA: Diagnosis not present

## 2016-01-06 DIAGNOSIS — M25511 Pain in right shoulder: Secondary | ICD-10-CM

## 2016-01-06 DIAGNOSIS — Z0289 Encounter for other administrative examinations: Secondary | ICD-10-CM

## 2016-01-06 DIAGNOSIS — I10 Essential (primary) hypertension: Secondary | ICD-10-CM

## 2016-01-06 DIAGNOSIS — Z91048 Other nonmedicinal substance allergy status: Secondary | ICD-10-CM

## 2016-01-06 MED ORDER — ATORVASTATIN CALCIUM 20 MG PO TABS: 20.0000 mg | ORAL_TABLET | Freq: Every day | ORAL | Status: DC

## 2016-01-06 MED ORDER — LOSARTAN POTASSIUM-HCTZ 100-25 MG PO TABS: 1.0000 | ORAL_TABLET | Freq: Every day | ORAL | Status: DC

## 2016-01-06 NOTE — Progress Notes (Signed)
Pre-visit discussion using our clinic review tool. No additional management support is needed unless otherwise documented below in the visit note.  

## 2016-01-06 NOTE — Progress Notes (Signed)
Patient ID: Deborah Bartlett, female   DOB: May 28, 1961, 55 y.o.   MRN: [id number]   Subjective:   Deborah Bartlett Patient ID: Deborah SquibbGina K Bartlett, female    DOB: May 28, 1961, 55 y.o.   MRN: [id number]  HPI  Patient here for a scheduled follow up.  [facility] 11/10/15.  S/p shoulder injection.  Doing better.  Tries to stay active.  Wears support hose.  Swelling is better.  No chest pain.  No sob.  No abdominal pain or cramping.  Bowels stable.  Some left heel pain at times. Some allergy symptoms - nasal congestion.     Past Medical History  Diagnosis Date  . GERD (gastroesophageal reflux disease)   . Hypertension   . Hypercholesteremia   . Perimenopausal 2014  . Migraine    Past Surgical History  Procedure Laterality Date  . Cesarean section  1990, 1993, 1999  . 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 Hypertension Mother   . Diabetes Mother   . Breast cancer Neg Hx   . Colon cancer Neg Hx    Social History   Social History  . Marital Status: Married    Spouse Name: N/A  . Number of Children: N/A  . Years of Education: N/A   Social History Main Topics  . Smoking status: Never Smoker   . Smokeless tobacco: Never Used  . Alcohol Use: No  . Drug Use: No  . Sexual Activity: Not Asked   Other Topics Concern  . None   Social History Narrative    Outpatient Encounter Prescriptions as of 01/06/2016  Medication Sig  . aspirin 81 MG tablet Take 81 mg by mouth daily.  Marland Kitchen. atorvastatin (LIPITOR) 20 MG tablet Take 1 tablet (20 mg total) by mouth daily.  Marland Kitchen. losartan-hydrochlorothiazide (HYZAAR) 100-25 MG tablet Take 1 tablet by mouth daily.  . Magnesium 400 MG TABS Take by mouth daily.  . Multiple Vitamin (MULTIVITAMIN) capsule Take 1 capsule by mouth daily.  Marland Kitchen. omeprazole (PRILOSEC) 20 MG capsule Take 20 mg by mouth daily.  . [DISCONTINUED] atorvastatin (LIPITOR) 20 MG tablet Take 1 tablet (20 mg total) by mouth daily.  MUST KEEP APPT ON 12/29/15  . [DISCONTINUED] losartan-hydrochlorothiazide (HYZAAR) 100-25 MG tablet Take 1 tablet by mouth daily. MUST KEEP APPT ON 12/29/15   No facility-administered encounter medications on file as of 01/06/2016.    Review of Systems  Constitutional: Negative for appetite change and unexpected weight change.  HENT: Negative for congestion and sinus pressure.   Respiratory: Negative for cough, chest tightness and shortness of breath.   Cardiovascular: Negative for chest pain, palpitations and leg swelling.  Gastrointestinal: Negative for nausea, vomiting, abdominal pain and diarrhea.  Genitourinary: Negative for dysuria and difficulty urinating.  Musculoskeletal: Negative for back pain and joint swelling.  Skin: Negative for color change and rash.  Neurological: Negative for dizziness, light-headedness and headaches.  Psychiatric/Behavioral: Negative for dysphoric mood and agitation.       Objective:    Physical Exam  Constitutional: She appears well-developed and well-nourished. No distress.  HENT:  Nose: Nose normal.  Mouth/Throat: Oropharynx is clear and moist.  Neck: Neck supple. No thyromegaly present.  Cardiovascular: Normal rate and regular rhythm.   Pulmonary/Chest: Breath sounds normal. No respiratory distress. She has no wheezes.  Abdominal: Soft. Bowel sounds are normal. There is no tenderness.  Musculoskeletal: She exhibits no edema or tenderness.  Swelling improved.    Lymphadenopathy:    She has no cervical adenopathy.  Skin: No rash noted. No erythema.  Psychiatric: She has a normal mood and affect. Her behavior is normal.    BP 110/80 mmHg  Pulse 112  Temp(Src) 98.2 F (36.8 C) (Oral)  Resp 18  Ht  (1.549 m)  Wt 151 lb (68.493 kg)  BMI 28.55 kg/m2  SpO2 97% Wt Readings from Last 3 Encounters:  01/06/16 151 lb (68.493 kg)  04/20/15 152 lb 4 oz (69.06 kg)  02/24/15 150 lb 2 oz (68.096 kg)     Lab Results  Component Value Date    WBC 4.6 01/08/2016   HGB 12.7 01/08/2016   HCT 37.9 01/08/2016   PLT 300.0 01/08/2016   GLUCOSE 98 01/08/2016   CHOL 196 01/08/2016   TRIG 138.0 01/08/2016   HDL 42.50 01/08/2016   LDLDIRECT 124.2 03/11/2014   LDLCALC 126* 01/08/2016   ALT 21 01/08/2016   AST 20 01/08/2016   NA 140 01/08/2016   K 3.9 01/08/2016   CL 103 01/08/2016   CREATININE 0.79 01/08/2016   BUN 19 01/08/2016   CO2 29 01/08/2016   TSH 2.39 01/08/2016   HGBA1C 6.5 01/08/2016       Assessment & Plan:   Problem List Items Addressed This Visit    Encounter for completion of form with patient    Obtain labs for form completion.        Environmental allergies    Saline nasal spray and nasacort nasal spray as directed.  Follow.       Essential hypertension, benign    Blood pressure under good control.  Continue same medication regimen.  Follow pressures.  Follow metabolic panel.        Relevant Medications   losartan-hydrochlorothiazide (HYZAAR) 100-25 MG tablet   atorvastatin (LIPITOR) 20 MG tablet   GERD (gastroesophageal reflux disease)    On omeprazole.  Symptoms controlled.       Hypercholesterolemia    On lipitor.  Low cholesterol diet and exercise.  Follow lipid panel and liver function tests.   Lab Results  Component Value Date   CHOL 196 01/08/2016   HDL 42.50 01/08/2016   LDLCALC 126* 01/08/2016   LDLDIRECT 124.2 03/11/2014   TRIG 138.0 01/08/2016   CHOLHDL 5 01/08/2016        Relevant Medications   losartan-hydrochlorothiazide (HYZAAR) 100-25 MG tablet   atorvastatin (LIPITOR) 20 MG tablet   Left leg swelling    Saw vascular surgery.  Swelling improved.  Wearing compression hose.  Some left heel pain.  Supports.  Stretches.        Right shoulder pain    S/p injection.  Doing better.  Saw Dr Gavin Potters.         Other Visit Diagnoses    Routine general medical examination at a health care facility    -  Primary    Relevant Orders    Nicotine/cotinine metabolites (Completed)         Dale Cairnbrook, MD

## 2016-01-06 NOTE — Patient Instructions (Signed)
Saline nasal spray - flush nose at least 2-3x/day  nasacort nasal spray - 2 sprays each nostril one time per day.  Do this in the evening.  

## 2016-01-08 ENCOUNTER — Other Ambulatory Visit (INDEPENDENT_AMBULATORY_CARE_PROVIDER_SITE_OTHER): Payer: BC Managed Care – PPO

## 2016-01-08 DIAGNOSIS — I10 Essential (primary) hypertension: Secondary | ICD-10-CM | POA: Diagnosis not present

## 2016-01-08 DIAGNOSIS — R739 Hyperglycemia, unspecified: Secondary | ICD-10-CM | POA: Diagnosis not present

## 2016-01-08 DIAGNOSIS — K219 Gastro-esophageal reflux disease without esophagitis: Secondary | ICD-10-CM

## 2016-01-08 DIAGNOSIS — E7800 Pure hypercholesterolemia, unspecified: Secondary | ICD-10-CM | POA: Diagnosis not present

## 2016-01-08 DIAGNOSIS — Z0000 Encounter for general adult medical examination without abnormal findings: Secondary | ICD-10-CM

## 2016-01-08 LAB — COMPREHENSIVE METABOLIC PANEL
ALK PHOS: 108 U/L (ref 39–117)
ALT: 21 U/L (ref 0–35)
AST: 20 U/L (ref 0–37)
Albumin: 4.3 g/dL (ref 3.5–5.2)
BUN: 19 mg/dL (ref 6–23)
CO2: 29 meq/L (ref 19–32)
Calcium: 10.2 mg/dL (ref 8.4–10.5)
Chloride: 103 mEq/L (ref 96–112)
Creatinine, Ser: 0.79 mg/dL (ref 0.40–1.20)
GFR: 80.71 mL/min (ref >60.00)
GLUCOSE: 98 mg/dL (ref 70–99)
Potassium: 3.9 mEq/L (ref 3.5–5.1)
SODIUM: 140 meq/L (ref 135–145)
TOTAL PROTEIN: 7.2 g/dL (ref 6.0–8.3)
Total Bilirubin: 0.4 mg/dL (ref 0.2–1.2)

## 2016-01-08 LAB — CBC WITH DIFFERENTIAL/PLATELET
BASOS ABS: 0 10*3/uL (ref 0.0–0.1)
Basophils Relative: 0.9 % (ref 0.0–3.0)
Eosinophils Absolute: 0.2 10*3/uL (ref 0.0–0.7)
Eosinophils Relative: 4 % (ref 0.0–5.0)
HCT: 37.9 % (ref 36.0–46.0)
Hemoglobin: 12.7 g/dL (ref 12.0–15.0)
LYMPHS ABS: 1.4 10*3/uL (ref 0.7–4.0)
Lymphocytes Relative: 31.4 % (ref 12.0–46.0)
MCHC: 33.5 g/dL (ref 30.0–36.0)
MCV: 86.6 fl (ref 78.0–100.0)
MONO ABS: 0.5 10*3/uL (ref 0.1–1.0)
Monocytes Relative: 10.8 % (ref 3.0–12.0)
NEUTROS PCT: 52.9 % (ref 43.0–77.0)
Neutro Abs: 2.4 10*3/uL (ref 1.4–7.7)
PLATELETS: 300 10*3/uL (ref 150.0–400.0)
RBC: 4.37 Mil/uL (ref 3.87–5.11)
RDW: 14 % (ref 11.5–15.5)
WBC: 4.6 10*3/uL (ref 4.0–10.5)

## 2016-01-08 LAB — LIPID PANEL
CHOL/HDL RATIO: 5
Cholesterol: 196 mg/dL (ref 0–200)
HDL: 42.5 mg/dL (ref >39.00)
LDL CALC: 126 mg/dL — AB (ref 0–99)
NONHDL: 153.4
TRIGLYCERIDES: 138 mg/dL (ref 0.0–149.0)
VLDL: 27.6 mg/dL (ref 0.0–40.0)

## 2016-01-08 LAB — TSH: TSH: 2.39 u[IU]/mL (ref 0.35–4.50)

## 2016-01-08 LAB — HEMOGLOBIN A1C: Hgb A1c MFr Bld: 6.5 % (ref 4.6–6.5)

## 2016-01-12 ENCOUNTER — Other Ambulatory Visit: Payer: Self-pay | Admitting: Internal Medicine

## 2016-01-12 DIAGNOSIS — E119 Type 2 diabetes mellitus without complications: Secondary | ICD-10-CM

## 2016-01-12 LAB — NICOTINE/COTININE METABOLITES

## 2016-01-12 NOTE — Progress Notes (Signed)
Ordered placed for [facility] referral.

## 2016-01-15 ENCOUNTER — Telehealth: Payer: Self-pay | Admitting: Internal Medicine

## 2016-01-15 NOTE — Telephone Encounter (Signed)
Spoke with the patient, she had dropped off paperwork that Dr. Lorin PicketScott has and it was needing the nicotine results added to it. It then needs to be faxed to the insurance with the fax number provided.  Can you please see if this has been done or if not, when it is done can you call the patient so she is aware.  Thanks she will call Tuesday afternoon if she hasn't heard from you.

## 2016-01-15 NOTE — Telephone Encounter (Signed)
Will complete form on Tuesday.

## 2016-01-15 NOTE — Telephone Encounter (Signed)
Pt was calling to see if her nicotine results are in. They need to be faxed to insurance company by May 31. Call pt on cell phone number.

## 2016-01-18 ENCOUNTER — Encounter: Payer: Self-pay | Admitting: Internal Medicine

## 2016-01-18 DIAGNOSIS — Z0289 Encounter for other administrative examinations: Secondary | ICD-10-CM | POA: Insufficient documentation

## 2016-01-18 DIAGNOSIS — Z9109 Other allergy status, other than to drugs and biological substances: Secondary | ICD-10-CM | POA: Insufficient documentation

## 2016-01-18 NOTE — Assessment & Plan Note (Signed)
On omeprazole.  Symptoms controlled.  

## 2016-01-18 NOTE — Assessment & Plan Note (Signed)
Blood pressure under good control.  Continue same medication regimen.  Follow pressures.  Follow metabolic panel.   

## 2016-01-18 NOTE — Assessment & Plan Note (Signed)
Saw vascular surgery.  Swelling improved.  Wearing compression hose.  Some left heel pain.  Supports.  Stretches.

## 2016-01-18 NOTE — Assessment & Plan Note (Signed)
On lipitor.  Low cholesterol diet and exercise.  Follow lipid panel and liver function tests.   Lab Results  Component Value Date   CHOL 196 01/08/2016   HDL 42.50 01/08/2016   LDLCALC 126* 01/08/2016   LDLDIRECT 124.2 03/11/2014   TRIG 138.0 01/08/2016   CHOLHDL 5 01/08/2016

## 2016-01-18 NOTE — Assessment & Plan Note (Signed)
Obtain labs for form completion.

## 2016-01-18 NOTE — Assessment & Plan Note (Signed)
S/p injection.  Doing better.  [facility].

## 2016-01-18 NOTE — Assessment & Plan Note (Signed)
Saline nasal spray and nasacort nasal spray as directed.  Follow.  

## 2016-01-19 NOTE — Telephone Encounter (Signed)
Form faxed & left voicemail on patient cell to let her know.

## 2016-01-21 ENCOUNTER — Other Ambulatory Visit: Payer: Self-pay | Admitting: Internal Medicine

## 2016-02-17 ENCOUNTER — Encounter: Payer: Self-pay | Admitting: *Deleted

## 2016-02-17 ENCOUNTER — Telehealth: Payer: Self-pay | Admitting: *Deleted

## 2016-02-17 ENCOUNTER — Encounter: Payer: BC Managed Care – PPO | Attending: Internal Medicine | Admitting: *Deleted

## 2016-02-17 VITALS — BP 126/84 | Ht 60.0 in | Wt 150.5 lb

## 2016-02-17 DIAGNOSIS — E119 Type 2 diabetes mellitus without complications: Secondary | ICD-10-CM | POA: Insufficient documentation

## 2016-02-17 MED ORDER — ONETOUCH ULTRASOFT LANCETS MISC: Status: AC

## 2016-02-17 MED ORDER — GLUCOSE BLOOD VI STRP: ORAL_STRIP | Status: DC

## 2016-02-17 NOTE — Telephone Encounter (Signed)
Patient has requested a Rx for test strips and lances she was diagnosed with type 2 diabetes.  Machine: one touch zero flex  [facility]

## 2016-02-17 NOTE — Patient Instructions (Signed)
Check blood sugars 2 x day before breakfast and 2 hrs after supper - 3 x week  Exercise: Begin walking for 15 minutes  3 days a week and gradually increase to 150 minutes/week  Eat 3 meals day,2  snacks a day Space meals 4-6 hours apart Continue to avoid sugar sweetened drinks (soda)  Bring blood sugar records to the next class  Call your doctor for a prescription for:  1. Meter strips (type) One Touch Verio checking  3-4 times per week  2. Lancets (type) One Touch Delica checking  3-4  times per week  Return for classes on:

## 2016-02-17 NOTE — Telephone Encounter (Signed)
Sent Rx to pharmacy. thanks

## 2016-02-17 NOTE — Progress Notes (Signed)
Diabetes Self-Management Education  Visit Type: First/Initial  Appt. Start Time: 1025 Appt. End Time: 1140  02/17/2016  Ms. Deborah Bartlett, identified by name and date of birth, is a 55 y.o. female with a diagnosis of Diabetes: Type 2.   ASSESSMENT  Blood pressure 126/84, height 5' (1.524 m), weight 150 lb 8 oz (68.266 kg). Body mass index is 29.39 kg/(m^2).      Diabetes Self-Management Education - 02/17/16 1246    Visit Information   Visit Type First/Initial   Initial Visit   Diabetes Type Type 2   Are you currently following a meal plan? Yes   What type of meal plan do you follow? "stopped drinking regular sodas and started drinking slim fast once a day"   Are you taking your medications as prescribed? Yes   Date Diagnosed Jan 08, 2016   Psychosocial Assessment   Patient Belief/Attitude about Diabetes Motivated to manage diabetes   Self-care barriers None   Self-management support Doctor's office;Family   Patient Concerns Nutrition/Meal planning;Glycemic Control;Monitoring;Medication;Problem Solving;Healthy Lifestyle   Special Needs None   Preferred Learning Style Auditory;Visual;Hands on   Learning Readiness Change in progress   How often do you need to have someone help you when you read instructions, pamphlets, or other written materials from your doctor or pharmacy? 1 - Never   What is the last grade level you completed in school? 12th   Pre-Education Assessment   Patient understands the diabetes disease and treatment process. Needs Instruction   Patient understands incorporating nutritional management into lifestyle. Needs Instruction   Patient undertands incorporating physical activity into lifestyle. Needs Instruction   Patient understands using medications safely. Needs Instruction   Patient understands monitoring blood glucose, interpreting and using results Needs Instruction   Patient understands prevention, detection, and treatment of acute complications. Needs  Instruction   Patient understands prevention, detection, and treatment of chronic complications. Needs Instruction   Patient understands how to develop strategies to address psychosocial issues. Needs Instruction   Patient understands how to develop strategies to promote health/change behavior. Needs Instruction   Complications   Last HgB A1C per patient/outside source 6.5 %  01/08/16   How often do you check your blood sugar? 0 times/day (not testing)  Provided One Touch Verio Flex and instructed on use. BG upon return demonstration was 112 mg/dL at 16:1011:25 am - 2 hrs pp.    Have you had a dilated eye exam in the past 12 months? No  usually goes every 2 years - will go in December   Have you had a dental exam in the past 12 months? Yes   Are you checking your feet? Yes   How many days per week are you checking your feet? 7   Dietary Intake   Breakfast boiled egg and grits; slim fast with banana   Snack (morning) apple or banana   Lunch left overs   Dinner beef or pork chops or chicken or hot dogs; green beans, broccolli, peas, corn, mac-n-cheese   Snack (evening) apple, popcorn, popcicle, chips   Exercise   Exercise Type ADL's   Patient Education   Previous Diabetes Education No   Disease state  Definition of diabetes, type 1 and 2, and the diagnosis of diabetes   Nutrition management  Role of diet in the treatment of diabetes and the relationship between the three main macronutrients and blood glucose level   Physical activity and exercise  Role of exercise on diabetes management, blood pressure control and  cardiac health.   Monitoring Taught/evaluated SMBG meter.;Purpose and frequency of SMBG.;Identified appropriate SMBG and/or A1C goals.   Chronic complications Relationship between chronic complications and blood glucose control   Psychosocial adjustment Identified and addressed patients feelings and concerns about diabetes   Individualized Goals (developed by patient)   Reducing Risk  Improve blood sugars Decrease medications Prevent diabetes complications Lose weight Lead a healthier lifestyle   Outcomes   Expected Outcomes Demonstrated interest in learning. Expect positive outcomes   Future DMSE 2 wks      Individualized Plan for Diabetes Self-Management Training:   Learning Objective:  Patient will have a greater understanding of diabetes self-management. Patient education plan is to attend individual and/or group sessions per assessed needs and concerns.   Plan:   Patient Instructions  Check blood sugars 2 x day before breakfast and 2 hrs after supper - 3 x week Exercise: Begin walking for 15 minutes  3 days a week and gradually increase to 150 minutes/week Eat 3 meals day,2  snacks a day Space meals 4-6 hours apart Continue to avoid sugar sweetened drinks (soda) Bring blood sugar records to the next class Call your doctor for a prescription for:  1. Meter strips (type) One Touch Verio checking  3-4 times per week  2. Lancets (type) One Touch Delica checking  3-4  times per week   Expected Outcomes:  Demonstrated interest in learning. Expect positive outcomes  Education material provided:  General Meal Planning Guidelines Simple Meal Plan Meter - One Touch Verio Flex  If problems or questions, patient to contact team via:  Deborah SettlerSheila Akhilesh Sassone, RN, CCM, CDE [phone]  Future DSME appointment: 2 wks  March 03, 2016 for Diabetes Class 1

## 2016-03-03 ENCOUNTER — Encounter: Payer: BC Managed Care – PPO | Attending: Internal Medicine | Admitting: Dietician

## 2016-03-03 ENCOUNTER — Encounter: Payer: Self-pay | Admitting: Dietician

## 2016-03-03 VITALS — Ht 60.0 in | Wt 149.9 lb

## 2016-03-03 DIAGNOSIS — E119 Type 2 diabetes mellitus without complications: Secondary | ICD-10-CM | POA: Diagnosis not present

## 2016-03-03 NOTE — Progress Notes (Signed)

## 2016-03-10 ENCOUNTER — Encounter: Payer: Self-pay | Admitting: *Deleted

## 2016-03-10 ENCOUNTER — Encounter: Payer: BC Managed Care – PPO | Admitting: *Deleted

## 2016-03-10 VITALS — Wt 151.1 lb

## 2016-03-10 DIAGNOSIS — E119 Type 2 diabetes mellitus without complications: Secondary | ICD-10-CM

## 2016-03-10 NOTE — Progress Notes (Signed)

## 2016-03-17 ENCOUNTER — Encounter: Payer: Self-pay | Admitting: Dietician

## 2016-03-17 ENCOUNTER — Encounter: Payer: Self-pay | Admitting: *Deleted

## 2016-03-17 ENCOUNTER — Encounter: Payer: BC Managed Care – PPO | Admitting: Dietician

## 2016-03-17 VITALS — BP 120/70 | Ht 60.0 in | Wt 151.0 lb

## 2016-03-17 DIAGNOSIS — E119 Type 2 diabetes mellitus without complications: Secondary | ICD-10-CM | POA: Diagnosis not present

## 2016-03-17 NOTE — Progress Notes (Signed)

## 2016-04-26 ENCOUNTER — Telehealth: Payer: Self-pay

## 2016-04-26 ENCOUNTER — Ambulatory Visit
Admission: RE | Admit: 2016-04-26 | Discharge: 2016-04-26 | Disposition: A | Discharge status: Home / Self Care | Payer: BC Managed Care – PPO | Source: Ambulatory Visit | Attending: Family | Admitting: Family

## 2016-04-26 ENCOUNTER — Encounter: Payer: Self-pay | Admitting: Family

## 2016-04-26 ENCOUNTER — Ambulatory Visit (INDEPENDENT_AMBULATORY_CARE_PROVIDER_SITE_OTHER): Payer: BC Managed Care – PPO | Admitting: Family

## 2016-04-26 VITALS — BP 118/70 | HR 108 | Temp 98.2°F | Ht 60.0 in | Wt 148.6 lb

## 2016-04-26 DIAGNOSIS — M7989 Other specified soft tissue disorders: Secondary | ICD-10-CM | POA: Diagnosis not present

## 2016-04-26 DIAGNOSIS — R000 Tachycardia, unspecified: Secondary | ICD-10-CM

## 2016-04-26 MED ORDER — DOXYCYCLINE HYCLATE 100 MG PO TABS: 100.0000 mg | ORAL_TABLET | Freq: Two times a day (BID) | ORAL | 0 refills | Status: DC

## 2016-04-26 NOTE — Telephone Encounter (Signed)
Stat read on Left leg Ultrasound.  Negative for DVT.   Please advise, thanks

## 2016-04-26 NOTE — Patient Instructions (Signed)
ultrasound immediately today at [facility].  Prescription for antibiotic. Please elevate your leg and let us know if it's not better. Please maintain close vigilance.   Cellulitis Cellulitis is an infection of the skin and the tissue beneath it. The infected area is usually red and tender. Cellulitis occurs most often in the arms and lower legs.  CAUSES  Cellulitis is caused by bacteria that enter the skin through cracks or cuts in the skin. The most common types of bacteria that cause cellulitis are staphylococci and streptococci. SIGNS AND SYMPTOMS   Redness and warmth.  Swelling.  Tenderness or pain.  Fever. DIAGNOSIS  Your health care provider can usually determine what is wrong based on a physical exam. Blood tests may also be done. TREATMENT  Treatment usually involves taking an antibiotic medicine. HOME CARE INSTRUCTIONS   Take your antibiotic medicine as directed by your health care provider. Finish the antibiotic even if you start to feel better.  Keep the infected arm or leg elevated to reduce swelling.  Apply a warm cloth to the affected area up to 4 times per day to relieve pain.  Take medicines only as directed by your health care provider.  Keep all follow-up visits as directed by your health care provider. SEEK MEDICAL CARE IF:   You notice red streaks coming from the infected area.  Your red area gets larger or turns dark in color.  Your bone or joint underneath the infected area becomes painful after the skin has healed.  Your infection returns in the same area or another area.  You notice a swollen bump in the infected area.  You develop new symptoms.  You have a fever. SEEK IMMEDIATE MEDICAL CARE IF:   You feel very sleepy.  You develop vomiting or diarrhea.  You have a general ill feeling (malaise) with muscle aches and pains.   This information is not intended to replace advice given to you by your health care provider. Make sure you  discuss any questions you have with your health care provider.   Document Released: 05/18/2005 Document Revised: 04/29/2015 Document Reviewed: 10/24/2011 Elsevier Interactive Patient Education [employer]2016 Elsevier Inc.

## 2016-04-26 NOTE — Progress Notes (Signed)
Subjective:    Patient ID: Deborah Bartlett, female    DOB: [birth year], 55 y.o.   MRN: [id number]  CC: Deborah Bartlett is a 55 y.o. female who presents today for an acute visit.    HPI: Patient here for evaluation of left lower extremity swelling which started last night. Endorses HA, chills, one episode of emesis. Overall not feeling well which started prior to leg swelling ( this happens every time shehas cellulitis). Feels 'heat in leg.' Has been elevating leg. 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   She is a complex history of cellulitis in 2016 and was even hospitalized for 2 days- per chart review, was treated by PCP with doxycycline 02/2015. No history of MRSA.  No h/o DVT. No SOB, chest pain.     HISTORY:  Past Medical History:  Diagnosis Date  . Diabetes mellitus without complication (HCC)   . GERD (gastroesophageal reflux disease)   . Hypercholesteremia   . Hypertension   . Migraine   . Perimenopausal 2014   Past Surgical History:  Procedure Laterality Date  . 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 CESAREAN SECTION  1990, 18, 1999   Family History  Problem Relation Age of Onset  . Hypertension Mother   . Diabetes Mother   . Breast cancer Neg Hx   . Colon cancer Neg Hx   . Diabetes Maternal Grandmother   . Diabetes Maternal Grandfather     Allergies: Codeine Current Outpatient Prescriptions on File Prior to Visit  Medication Sig Dispense Refill  . aspirin 81 MG tablet Take 81 mg by mouth daily.    Marland Kitchen atorvastatin (LIPITOR) 20 MG tablet Take 1 tablet (20 mg total) by mouth daily. 30 tablet 11  . ferrous sulfate 325 (65 FE) MG EC tablet Take 325 mg by mouth every other day.    Marland Kitchen glucose blood test strip Use as instructed to check blood sugars 3-4 times a week, twice a day.  E11.9 Dispense One Touch verio test strips 100 each 12  . Lancets (ONETOUCH ULTRASOFT) lancets Use as instructed to check Blood sugars 3-4 times a week, twice a day.   Dispense One Touch brand. E11.9 100 each 12  . losartan-hydrochlorothiazide (HYZAAR) 100-25 MG tablet Take 1 tablet by mouth daily. 30 tablet 11  . Magnesium 400 MG TABS Take 1 tablet by mouth daily.     . Multiple Vitamin (MULTIVITAMIN) capsule Take 1 capsule by mouth daily.    Marland Kitchen omeprazole (PRILOSEC) 20 MG capsule Take 20 mg by mouth daily.     No current facility-administered medications on file prior to visit.     Social History  Substance Use Topics  . Smoking status: Never Smoker  . Smokeless tobacco: Never Used  . Alcohol use No    Review of Systems  Constitutional: Negative for chills and fever.  Respiratory: Negative for shortness of breath.   Cardiovascular: Positive for leg swelling. Negative for chest pain and palpitations.  Gastrointestinal: Negative for nausea and vomiting.  Skin: Positive for rash.      Objective:    BP 118/70   Pulse (!) 108   Temp 98.2 F (36.8 C) (Oral)   Ht 5' (1.524 m)   Wt 148 lb 9.6 oz (67.4 kg)   SpO2 98%   BMI 29.02 kg/m    Physical Exam  Constitutional: She appears well-developed and well-nourished.  Eyes: Conjunctivae are normal.  Cardiovascular: Normal rate, regular  rhythm, normal heart sounds and normal pulses.   LLE edema, non pitting. No palpable cords or masses. LLE erythema with increased warmth. No papules. Non draining. LLE calf > RLE calf.  LE hair growth symmetric and present. No discoloration of varicosities noted. LE warm and palpable pedal pulses.   Pulmonary/Chest: Effort normal and breath sounds normal. She has no wheezes. She has no rhonchi. She has no rales.  Neurological: She is alert.  Skin: Skin is warm and dry.  Psychiatric: She has a normal mood and affect. Her speech is normal and behavior is normal. Thought content normal.  Vitals reviewed.      Assessment & Plan:   1. Left leg swelling Symptoms and history consistent with cellulitis. No history of MRSA. Cellulitis is nondraining and unable to  obtain culture. Pending left US to evaluate DVT. Treating empirically with doxycycline. Patient and husband agreed to maintain close vigilance and if cellulitis does not quickly respond overnight to doxycycline, I advised patient to go to emergency room for IV antibiotics.  - US Venous Img Lower Unilateral Left - doxycycline (VIBRA-TABS) 100 MG tablet; Take 1 tablet (100 mg total) by mouth 2 (two) times daily.  Dispense: 20 tablet; Refill: 0  2. Tachycardia No symptoms to suggest ACS. Suspect tachycardia related to infection. Will treat underlying infection and expect HR to return to normal. Will closely monitor.    I am having [name] maintain her omeprazole, multivitamin, aspirin, Magnesium, losartan-hydrochlorothiazide, atorvastatin, ferrous sulfate, onetouch ultrasoft, and glucose blood.   No orders of the defined types were placed in this encounter.   Return precautions given.   Risks, benefits, and alternatives of the medications and treatment plan prescribed today were discussed, and patient expressed understanding.   Education regarding symptom management and diagnosis given to patient on AVS.  Continue to follow with Dale DurhamSCOTT, CHARLENE, MD for routine health maintenance.   Deborah Bartlett and I agreed with plan.   Rennie PlowmanMargaret Arnett, FNP

## 2016-04-26 NOTE — Progress Notes (Signed)
Pre visit review using our clinic review tool, if applicable. No additional management support is needed unless otherwise documented below in the visit note. 

## 2016-04-26 NOTE — Telephone Encounter (Signed)
Patient aware that's its negative DVT. Thanks Joretta Bacheloranya  Brock, patient would like us to email her a work note through Thursday. Will you help me with this ?? Thanks as always !

## 2016-04-28 NOTE — Telephone Encounter (Signed)
Please call patient-  She needs to be seen if leg is not better.   I would strongly advise ED as she had a tough time treating cellulitis in the past.    She may have work note.

## 2016-04-28 NOTE — Telephone Encounter (Signed)
Mal AmabileBrock, please advise, thanks

## 2016-04-28 NOTE — Telephone Encounter (Signed)
Letter has been left up front.

## 2016-04-28 NOTE — Telephone Encounter (Signed)
Pt states that her leg is not any better and would like a work note out till Monday.. Please advise pt

## 2016-04-28 NOTE — Telephone Encounter (Signed)
SPoke with the patient, she is more concerned that her leg is better, but not up to the ability to be on it for the 10-12 hours that her job demands.  It is less red in color and the heat has gone.  She just wants to give herself a few days.  She will have someone pick up the note tomorrow.  thanks

## 2016-04-28 NOTE — Telephone Encounter (Signed)
Is it ok to do so?

## 2016-04-28 NOTE — Telephone Encounter (Signed)
Glad that it is better as we discussed.   Happy for her to stay off of it through the weekend !  Thanks Kenney Housemananya!

## 2016-05-11 ENCOUNTER — Other Ambulatory Visit: Payer: Self-pay | Admitting: Rheumatology

## 2016-05-11 DIAGNOSIS — M25512 Pain in left shoulder: Secondary | ICD-10-CM

## 2016-05-13 ENCOUNTER — Ambulatory Visit: Admission: RE | Admit: 2016-05-13 | Payer: BC Managed Care – PPO | Source: Ambulatory Visit

## 2016-05-24 ENCOUNTER — Ambulatory Visit
Admission: RE | Admit: 2016-05-24 | Discharge: 2016-05-24 | Disposition: A | Discharge status: Home / Self Care | Payer: BC Managed Care – PPO | Source: Ambulatory Visit | Attending: Rheumatology | Admitting: Rheumatology

## 2016-05-24 DIAGNOSIS — M25512 Pain in left shoulder: Secondary | ICD-10-CM

## 2016-05-24 DIAGNOSIS — M7582 Other shoulder lesions, left shoulder: Secondary | ICD-10-CM | POA: Insufficient documentation

## 2016-05-24 DIAGNOSIS — M75112 Incomplete rotator cuff tear or rupture of left shoulder, not specified as traumatic: Secondary | ICD-10-CM | POA: Diagnosis not present

## 2016-05-24 DIAGNOSIS — M7552 Bursitis of left shoulder: Secondary | ICD-10-CM | POA: Diagnosis not present

## 2016-05-24 LAB — POCT I-STAT CREATININE: Creatinine, Ser: 0.8 mg/dL (ref 0.44–1.00)

## 2016-05-24 MED ORDER — GADOBENATE DIMEGLUMINE 529 MG/ML IV SOLN
15.0000 mL | Freq: Once | INTRAVENOUS | Status: AC | PRN
  Administered 2016-05-24: 14 mL via INTRAVENOUS

## 2016-07-08 ENCOUNTER — Ambulatory Visit: Payer: BC Managed Care – PPO | Admitting: Internal Medicine

## 2016-07-12 ENCOUNTER — Ambulatory Visit: Payer: BC Managed Care – PPO | Admitting: Internal Medicine

## 2016-07-13 ENCOUNTER — Encounter: Payer: Self-pay | Admitting: Internal Medicine

## 2016-07-13 ENCOUNTER — Ambulatory Visit (INDEPENDENT_AMBULATORY_CARE_PROVIDER_SITE_OTHER): Payer: BC Managed Care – PPO | Admitting: Internal Medicine

## 2016-07-13 VITALS — BP 126/82 | HR 88 | Temp 97.9°F | Ht 60.0 in | Wt 149.8 lb

## 2016-07-13 DIAGNOSIS — Z1239 Encounter for other screening for malignant neoplasm of breast: Secondary | ICD-10-CM

## 2016-07-13 DIAGNOSIS — R739 Hyperglycemia, unspecified: Secondary | ICD-10-CM

## 2016-07-13 DIAGNOSIS — E119 Type 2 diabetes mellitus without complications: Secondary | ICD-10-CM | POA: Insufficient documentation

## 2016-07-13 DIAGNOSIS — I10 Essential (primary) hypertension: Secondary | ICD-10-CM

## 2016-07-13 DIAGNOSIS — M25512 Pain in left shoulder: Secondary | ICD-10-CM

## 2016-07-13 DIAGNOSIS — E7800 Pure hypercholesterolemia, unspecified: Secondary | ICD-10-CM

## 2016-07-13 DIAGNOSIS — R350 Frequency of micturition: Secondary | ICD-10-CM

## 2016-07-13 DIAGNOSIS — Z1231 Encounter for screening mammogram for malignant neoplasm of breast: Secondary | ICD-10-CM

## 2016-07-13 DIAGNOSIS — M7989 Other specified soft tissue disorders: Secondary | ICD-10-CM

## 2016-07-13 DIAGNOSIS — K219 Gastro-esophageal reflux disease without esophagitis: Secondary | ICD-10-CM

## 2016-07-13 LAB — CBC WITH DIFFERENTIAL/PLATELET
BASOS PCT: 1 % (ref 0.0–3.0)
Basophils Absolute: 0 10*3/uL (ref 0.0–0.1)
EOS ABS: 0.1 10*3/uL (ref 0.0–0.7)
EOS PCT: 3.5 % (ref 0.0–5.0)
HEMATOCRIT: 38.6 % (ref 36.0–46.0)
Hemoglobin: 12.8 g/dL (ref 12.0–15.0)
LYMPHS PCT: 34.6 % (ref 12.0–46.0)
Lymphs Abs: 1.5 10*3/uL (ref 0.7–4.0)
MCHC: 33.3 g/dL (ref 30.0–36.0)
MCV: 85 fl (ref 78.0–100.0)
Monocytes Absolute: 0.4 10*3/uL (ref 0.1–1.0)
Monocytes Relative: 10.7 % (ref 3.0–12.0)
NEUTROS ABS: 2.1 10*3/uL (ref 1.4–7.7)
Neutrophils Relative %: 50.2 % (ref 43.0–77.0)
PLATELETS: 286 10*3/uL (ref 150.0–400.0)
RBC: 4.54 Mil/uL (ref 3.87–5.11)
RDW: 14.4 % (ref 11.5–15.5)
WBC: 4.2 10*3/uL (ref 4.0–10.5)

## 2016-07-13 LAB — URINALYSIS, ROUTINE W REFLEX MICROSCOPIC
Bilirubin Urine: NEGATIVE
Hgb urine dipstick: NEGATIVE
KETONES UR: NEGATIVE
Leukocytes, UA: NEGATIVE
Nitrite: NEGATIVE
PH: 8 (ref 5.0–8.0)
RBC / HPF: NONE SEEN (ref 0–?)
SPECIFIC GRAVITY, URINE: 1.015 (ref 1.000–1.030)
Total Protein, Urine: NEGATIVE
Urine Glucose: NEGATIVE
Urobilinogen, UA: 0.2 (ref 0.0–1.0)

## 2016-07-13 LAB — LIPID PANEL
CHOLESTEROL: 196 mg/dL (ref 0–200)
HDL: 55.2 mg/dL (ref >39.00)
LDL Cholesterol: 113 mg/dL — ABNORMAL HIGH (ref 0–99)
NonHDL: 140.79
TRIGLYCERIDES: 140 mg/dL (ref 0.0–149.0)
Total CHOL/HDL Ratio: 4
VLDL: 28 mg/dL (ref 0.0–40.0)

## 2016-07-13 LAB — BASIC METABOLIC PANEL
BUN: 18 mg/dL (ref 6–23)
CHLORIDE: 103 meq/L (ref 96–112)
CO2: 31 mEq/L (ref 19–32)
Calcium: 10.3 mg/dL (ref 8.4–10.5)
Creatinine, Ser: 0.76 mg/dL (ref 0.40–1.20)
GFR: 84.24 mL/min (ref >60.00)
GLUCOSE: 99 mg/dL (ref 70–99)
POTASSIUM: 4.4 meq/L (ref 3.5–5.1)
SODIUM: 141 meq/L (ref 135–145)

## 2016-07-13 LAB — HEMOGLOBIN A1C: Hgb A1c MFr Bld: 6.3 % (ref 4.6–6.5)

## 2016-07-13 LAB — HEPATIC FUNCTION PANEL
ALBUMIN: 4.3 g/dL (ref 3.5–5.2)
ALT: 18 U/L (ref 0–35)
AST: 16 U/L (ref 0–37)
Alkaline Phosphatase: 100 U/L (ref 39–117)
Bilirubin, Direct: 0.1 mg/dL (ref 0.0–0.3)
TOTAL PROTEIN: 7.1 g/dL (ref 6.0–8.3)
Total Bilirubin: 0.4 mg/dL (ref 0.2–1.2)

## 2016-07-13 NOTE — Progress Notes (Signed)
Pre visit review using our clinic review tool, if applicable. No additional management support is needed unless otherwise documented below in the visit note. 

## 2016-07-13 NOTE — Progress Notes (Signed)
Patient ID: Deborah Bartlett, female   DOB: May 10, 1961, 55 y.o.   MRN: [id number]   Subjective:    Patient ID: Deborah Bartlett, female    DOB: 03-26-1961, 55 y.o.   MRN: [id number]  HPI  Patient here for a scheduled follow up.  She reports she is doing relatively well.  Tries to stay active.  Leg swelling is better.  Wearing compression hose.  No chest pain.  No sob.  No acid reflux.  Takes prilosec.  No nausea or vomiting.  Bowels stable.  Does report some increased urinary frequency.  Nocturia x 2.  No dysuria.  Has been present for a while.  No vaginal symptoms.  Seeing ortho.  Has partial left rotator cuff tear.  Injection helps.  Good rom.  States sugars averaging 85-99 in the am and 120s in the pm.  Discussed diet and exercise.     Past Medical History:  Diagnosis Date  . Diabetes mellitus without complication ([place])   . GERD (gastroesophageal reflux disease)   . Hypercholesteremia   . Hypertension   . Migraine   . Perimenopausal 2014   Past Surgical History:  Procedure Laterality Date  . 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 [place], 1999   Family History  Problem Relation Age of Onset  . Hypertension Mother   . Diabetes Mother   . Diabetes Maternal Grandmother   . Diabetes Maternal Grandfather   . Breast cancer Neg Hx   . Colon cancer Neg Hx    Social History   Social History  . Marital status: Married    Spouse name: N/A  . Number of children: N/A  . Years of education: N/A   Social History Main Topics  . Smoking status: Never Smoker  . Smokeless tobacco: Never Used  . Alcohol use No  . Drug use: No  . Sexual activity: Not Asked   Other Topics Concern  . None   Social History Narrative  . None    Outpatient Encounter Prescriptions as of 07/13/2016  Medication Sig  . aspirin 81 MG tablet Take 81 mg by mouth daily.  Marland Kitchen atorvastatin (LIPITOR) 20 MG tablet Take 1 tablet (20 mg total) by mouth daily.  . ferrous  sulfate 325 (65 FE) MG EC tablet Take 325 mg by mouth every other day.  Marland Kitchen glucose blood test strip Use as instructed to check blood sugars 3-4 times a week, twice a day.  E11.9 Dispense One Touch verio test strips  . Lancets (ONETOUCH ULTRASOFT) lancets Use as instructed to check Blood sugars 3-4 times a week, twice a day.  Dispense One Touch brand. E11.9  . losartan-hydrochlorothiazide (HYZAAR) 100-25 MG tablet Take 1 tablet by mouth daily.  . Magnesium 400 MG TABS Take 1 tablet by mouth daily.   . Multiple Vitamin (MULTIVITAMIN) capsule Take 1 capsule by mouth daily.  Marland Kitchen omeprazole (PRILOSEC) 20 MG capsule Take 20 mg by mouth daily.  . [DISCONTINUED] doxycycline (VIBRA-TABS) 100 MG tablet Take 1 tablet (100 mg total) by mouth 2 (two) times daily.   No facility-administered encounter medications on file as of 07/13/2016.     Review of Systems  Constitutional: Negative for appetite change and unexpected weight change.  HENT: Negative for congestion and sinus pressure.   Respiratory: Negative for cough, chest tightness and shortness of breath.   Cardiovascular: Negative for chest pain and palpitations.       Leg swelling improved.  Gastrointestinal: Negative for abdominal pain, diarrhea, nausea and vomiting.  Genitourinary: Positive for frequency. Negative for dysuria and hematuria.  Musculoskeletal: Negative for back pain.       Left shoulder issues as outlined.  No pain currently.    Skin: Negative for color change and rash.  Neurological: Negative for dizziness, light-headedness and headaches.  Psychiatric/Behavioral: Negative for agitation and dysphoric mood.       Objective:    Physical Exam  Constitutional: She appears well-developed and well-nourished. No distress.  HENT:  Nose: Nose normal.  Mouth/Throat: Oropharynx is clear and moist.  Neck: Neck supple. No thyromegaly present.  Cardiovascular: Normal rate and regular rhythm.   Pulmonary/Chest: Breath sounds normal. No  respiratory distress. She has no wheezes.  Abdominal: Soft. Bowel sounds are normal. There is no tenderness.  Musculoskeletal: She exhibits no edema or tenderness.  Lymphadenopathy:    She has no cervical adenopathy.  Skin: No rash noted. No erythema.  Psychiatric: She has a normal mood and affect. Her behavior is normal.    BP 126/82   Pulse 88   Temp 97.9 F (36.6 C) (Oral)   Ht 5' (1.524 m)   Wt 149 lb 12.8 oz (67.9 kg)   SpO2 96%   BMI 29.26 kg/m  Wt Readings from Last 3 Encounters:  07/13/16 149 lb 12.8 oz (67.9 kg)  04/26/16 148 lb 9.6 oz (67.4 kg)  03/17/16 151 lb (68.5 kg)     Lab Results  Component Value Date   WBC 4.2 07/13/2016   HGB 12.8 07/13/2016   HCT 38.6 07/13/2016   PLT 286.0 07/13/2016   GLUCOSE 99 07/13/2016   CHOL 196 07/13/2016   TRIG 140.0 07/13/2016   HDL 55.20 07/13/2016   LDLDIRECT 124.2 03/11/2014   LDLCALC 113 (H) 07/13/2016   ALT 18 07/13/2016   AST 16 07/13/2016   NA 141 07/13/2016   K 4.4 07/13/2016   CL 103 07/13/2016   CREATININE 0.76 07/13/2016   BUN 18 07/13/2016   CO2 31 07/13/2016   TSH 2.39 01/08/2016   HGBA1C 6.3 07/13/2016    Mr Shoulder Left W Wo Contrast  Result Date: 05/25/2016 CLINICAL DATA:  Pt states recurring sharp, shooting pain in left shoulder, sometimes constant dull ache in left shoulder, sometimes dropping things held in left hand. EXAM: MRI OF THE LEFT SHOULDER WITHOUT AND WITH CONTRAST TECHNIQUE: Multiplanar, multisequence MR imaging of the LEFT shoulder was performed before and after the administration of intravenous contrast. CONTRAST:  51m MULTIHANCE GADOBENATE DIMEGLUMINE 529 MG/ML IV SOLN COMPARISON:  None. FINDINGS: Rotator cuff: Mild tendinosis of the supraspinatus tendon with a small partial thickness bursal tear. Infraspinatus tendon is intact. Teres minor tendon is intact. Subscapularis tendon is intact. Muscles: No atrophy or abnormal signal of the muscles of the rotator cuff. Biceps long head:   Intact. Acromioclavicular Joint: Normal acromioclavicular joint. Type II acromion. Mild subacromial/subdeltoid bursal fluid. Glenohumeral Joint: No joint effusion. No chondral defect. Labrum:  Intact. Bones:  No marrow abnormality, fracture or dislocation. Other: None. IMPRESSION: 1. Mild tendinosis of the supraspinatus tendon with a small partial thickness bursal tear. 2. Mild subacromial/subdeltoid bursitis. 3. No evidence of septic arthritis. Electronically Signed   By: HKathreen Devoid  On: 05/25/2016 08:58       Assessment & Plan:   Problem List Items Addressed This Visit    Diabetes mellitus ([place]    Low carb diet and exercise.  Sugars as outlined.  Follow met b and a1c.  Essential hypertension, benign    Blood pressure under good control.  Continue same medication regimen.  Follow pressures.  Follow metabolic panel.        Relevant Orders   CBC with Differential/Platelet (Completed)   Basic metabolic panel (Completed)   GERD (gastroesophageal reflux disease)    Controlled on omeprazole.        Hypercholesterolemia - Primary   Relevant Orders   Hepatic function panel (Completed)   Lipid panel (Completed)   Left leg swelling    Previously saw vascular surgery.  Swelling improved.  She wears compression hose.  Follow.        Left shoulder pain    Evaluated by ortho.  Saw Dr Roland Rack.  Partial rotator cuff tear.  Stable.  Continue f/u with ortho.  Good rom.  Injection helps.        Urinary frequency    Has been present for a while now.  No dysuria.  No hematuria.  Has to go frequently.  Gets up 2x/night.  Discussed treatment.  She feel she is emptying her bladder.  Check urine.  If no infection, can try medication for overactive bladder.  Follow.        Relevant Orders   Urinalysis, Routine w reflex microscopic (not at [facility]) (Completed)    Other Visit Diagnoses    Breast cancer screening       Relevant Orders   MM DIGITAL SCREENING BILATERAL       Einar Pheasant,  MD

## 2016-07-18 ENCOUNTER — Encounter: Payer: Self-pay | Admitting: Internal Medicine

## 2016-07-18 ENCOUNTER — Telehealth: Payer: Self-pay | Admitting: *Deleted

## 2016-07-18 DIAGNOSIS — R350 Frequency of micturition: Secondary | ICD-10-CM | POA: Insufficient documentation

## 2016-07-18 MED ORDER — TOLTERODINE TARTRATE 2 MG PO TABS: 2.0000 mg | ORAL_TABLET | Freq: Two times a day (BID) | ORAL | 0 refills | Status: DC

## 2016-07-18 NOTE — Assessment & Plan Note (Signed)
Evaluated by ortho.  Saw Dr Joice LoftsPoggi.  Partial rotator cuff tear.  Stable.  Continue f/u with ortho.  Good rom.  Injection helps.

## 2016-07-18 NOTE — Assessment & Plan Note (Signed)
Controlled on omeprazole.   

## 2016-07-18 NOTE — Assessment & Plan Note (Signed)
Previously saw vascular surgery.  Swelling improved.  She wears compression hose.  Follow.

## 2016-07-18 NOTE — Assessment & Plan Note (Signed)
Has been present for a while now.  No dysuria.  No hematuria.  Has to go frequently.  Gets up 2x/night.  Discussed treatment.  She feel she is emptying her bladder.  Check urine.  If no infection, can try medication for overactive bladder.  Follow.

## 2016-07-18 NOTE — Assessment & Plan Note (Signed)
Blood pressure under good control.  Continue same medication regimen.  Follow pressures.  Follow metabolic panel.   

## 2016-07-18 NOTE — Telephone Encounter (Signed)
-----   Message from Dale Durhamharlene Scott, MD sent at 07/18/2016  5:40 AM EST ----- Please call and notify pt that her cholesterol and her overall sugar control are ok.  a1c improved from last check (a1c 6.3).  Continue low carb diet.  We will follow.  Urinalysis ok.  Given persistent increased urinary frequency, can try detrol 2mg  bid.  If agreeable, can send in rx.  Notify us if any problems with the medication or if does not help.  Hgb, kidney function tests and liver function tests are wnl.

## 2016-07-18 NOTE — Telephone Encounter (Signed)
Pt requested lab results  Pt contact [phone] Please call after [phone]

## 2016-07-18 NOTE — Assessment & Plan Note (Addendum)
Low carb diet and exercise.  Sugars as outlined.  Follow met b and a1c.   

## 2016-07-18 NOTE — Telephone Encounter (Signed)
Patient notified and 30 day supply sent to pharmacy

## 2016-08-21 ENCOUNTER — Other Ambulatory Visit: Payer: Self-pay | Admitting: Internal Medicine

## 2016-08-24 ENCOUNTER — Ambulatory Visit
Admission: RE | Admit: 2016-08-24 | Discharge: 2016-08-24 | Disposition: A | Discharge status: Home / Self Care | Payer: BC Managed Care – PPO | Source: Ambulatory Visit | Attending: Internal Medicine | Admitting: Internal Medicine

## 2016-08-24 DIAGNOSIS — Z1239 Encounter for other screening for malignant neoplasm of breast: Secondary | ICD-10-CM

## 2016-08-25 ENCOUNTER — Telehealth: Payer: Self-pay | Admitting: Internal Medicine

## 2016-08-25 NOTE — Telephone Encounter (Signed)
After Speaking with Dr. Lorin PicketScott called the patient and referred her to the travel clinic in [place]. Patient was given the number to the travel clinic. We do not have on file any immunizations for patient. She was notified.

## 2016-08-25 NOTE — Telephone Encounter (Signed)
Pt called and stated that she is going on a mission trip to [place] on 09/28/16 and needs to know if she needs any vaccines before she leaves, she was also asking if she has had any hepatitis vaccines. Please advise, thank you!  Call pt @ [phone]

## 2016-08-31 ENCOUNTER — Encounter: Payer: Self-pay | Admitting: Radiology

## 2016-08-31 ENCOUNTER — Ambulatory Visit
Admission: RE | Admit: 2016-08-31 | Discharge: 2016-08-31 | Disposition: A | Discharge status: Home / Self Care | Payer: BC Managed Care – PPO | Source: Ambulatory Visit | Attending: Internal Medicine | Admitting: Internal Medicine

## 2016-08-31 DIAGNOSIS — Z1231 Encounter for screening mammogram for malignant neoplasm of breast: Secondary | ICD-10-CM | POA: Diagnosis not present

## 2016-09-02 ENCOUNTER — Telehealth: Payer: Self-pay | Admitting: Surgical

## 2016-09-02 NOTE — Telephone Encounter (Signed)
LM for patient to return call . Per Dr. Lorin PicketScott she would prefer for the patient to go to the travel clinic for vaccines. I have called to inform [facility] as well.

## 2016-09-05 ENCOUNTER — Other Ambulatory Visit: Payer: Self-pay | Admitting: Internal Medicine

## 2016-09-06 NOTE — Telephone Encounter (Signed)
Pt called and was informed of Dr.Scott's message

## 2016-09-09 ENCOUNTER — Other Ambulatory Visit: Payer: Self-pay | Admitting: Internal Medicine

## 2016-09-16 ENCOUNTER — Encounter: Payer: BC Managed Care – PPO | Admitting: Internal Medicine

## 2016-09-22 ENCOUNTER — Inpatient Hospital Stay: Payer: Self-pay

## 2016-09-22 ENCOUNTER — Ambulatory Visit (INDEPENDENT_AMBULATORY_CARE_PROVIDER_SITE_OTHER): Payer: BC Managed Care – PPO | Admitting: General Surgery

## 2016-09-22 ENCOUNTER — Ambulatory Visit (INDEPENDENT_AMBULATORY_CARE_PROVIDER_SITE_OTHER): Payer: BC Managed Care – PPO | Admitting: Internal Medicine

## 2016-09-22 ENCOUNTER — Encounter: Payer: Self-pay | Admitting: General Surgery

## 2016-09-22 ENCOUNTER — Encounter: Payer: Self-pay | Admitting: Internal Medicine

## 2016-09-22 VITALS — BP 120/72 | HR 97 | Temp 98.0°F | Resp 16 | Ht 60.0 in | Wt 153.4 lb

## 2016-09-22 VITALS — BP 144/84 | HR 90 | Temp 96.7°F | Resp 14 | Ht 60.0 in | Wt 152.0 lb

## 2016-09-22 DIAGNOSIS — L0291 Cutaneous abscess, unspecified: Secondary | ICD-10-CM | POA: Diagnosis not present

## 2016-09-22 DIAGNOSIS — L03319 Cellulitis of trunk, unspecified: Secondary | ICD-10-CM

## 2016-09-22 DIAGNOSIS — K219 Gastro-esophageal reflux disease without esophagitis: Secondary | ICD-10-CM

## 2016-09-22 DIAGNOSIS — Z0001 Encounter for general adult medical examination with abnormal findings: Secondary | ICD-10-CM | POA: Diagnosis not present

## 2016-09-22 DIAGNOSIS — E7800 Pure hypercholesterolemia, unspecified: Secondary | ICD-10-CM

## 2016-09-22 DIAGNOSIS — I10 Essential (primary) hypertension: Secondary | ICD-10-CM

## 2016-09-22 DIAGNOSIS — Z0000 Encounter for general adult medical examination without abnormal findings: Secondary | ICD-10-CM

## 2016-09-22 DIAGNOSIS — E119 Type 2 diabetes mellitus without complications: Secondary | ICD-10-CM

## 2016-09-22 DIAGNOSIS — M25512 Pain in left shoulder: Secondary | ICD-10-CM

## 2016-09-22 MED ORDER — HYDROCODONE-ACETAMINOPHEN 10-325 MG PO TABS: 1.0000 | ORAL_TABLET | Freq: Every day | ORAL | 0 refills | Status: AC

## 2016-09-22 MED ORDER — SULFAMETHOXAZOLE-TRIMETHOPRIM 800-160 MG PO TABS: 1.0000 | ORAL_TABLET | Freq: Two times a day (BID) | ORAL | 0 refills | Status: DC

## 2016-09-22 NOTE — Progress Notes (Signed)
Patient ID: Deborah Bartlett, female   DOB: 02-23-1961, 56 y.o.   MRN: [id number]  Chief Complaint  Patient presents with  . Abscess    HPI Deborah Bartlett is a 56 y.o. female here today for a lower abdominal/pubic abscess. She states she first noticed this area over the weekend. She states the area progressively got worse. Drainage started today. The area is painful. She is going to [place] next week. She checks her blood sugar periodically this am was 91.  HPI  Past Medical History:  Diagnosis Date  . Diabetes mellitus without complication (HCC)   . GERD (gastroesophageal reflux disease)   . Hypercholesteremia   . Hypertension   . Migraine   . Perimenopausal 2014    Past Surgical History:  Procedure Laterality Date  . 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 CESAREAN SECTION  1990, 1993, 1999  . COLONOSCOPY  2014   Dr Lemar Livings    Family History  Problem Relation Age of Onset  . Hypertension Mother   . Diabetes Mother   . Diabetes Maternal Grandmother   . Diabetes Maternal Grandfather   . Breast cancer Neg Hx   . Colon cancer Neg Hx     Social History Social History  Substance Use Topics  . Smoking status: Never Smoker  . Smokeless tobacco: Never Used  . Alcohol use No    Allergies  Allergen Reactions  . Codeine Palpitations    dizzy    Current Outpatient Prescriptions  Medication Sig Dispense Refill  . aspirin 81 MG tablet Take 81 mg by mouth daily.    Marland Kitchen atorvastatin (LIPITOR) 20 MG tablet Take 1 tablet (20 mg total) by mouth daily. 30 tablet 11  . ferrous sulfate 325 (65 FE) MG EC tablet Take 325 mg by mouth every other day.    Marland Kitchen glucose blood test strip Use as instructed to check blood sugars 3-4 times a week, twice a day.  E11.9 Dispense One Touch verio test strips 100 each 12  . Lancets (ONETOUCH ULTRASOFT) lancets Use as instructed to check Blood sugars 3-4 times a week, twice a day.  Dispense One Touch brand. E11.9 100 each 12  .  losartan-hydrochlorothiazide (HYZAAR) 100-25 MG tablet Take 1 tablet by mouth daily. 30 tablet 11  . Magnesium 400 MG TABS Take 1 tablet by mouth daily.     . Multiple Vitamin (MULTIVITAMIN) capsule Take 1 capsule by mouth daily.    Marland Kitchen omeprazole (PRILOSEC) 20 MG capsule Take 20 mg by mouth daily.    Marland Kitchen tolterodine (DETROL) 2 MG tablet TAKE 1 TABLET (2 MG TOTAL) BY MOUTH 2 (TWO) TIMES DAILY. 60 tablet 1  . HYDROcodone-acetaminophen (NORCO) 10-325 MG tablet Take 1 tablet by mouth at bedtime. 20 tablet 0  . sulfamethoxazole-trimethoprim (BACTRIM DS,SEPTRA DS) 800-160 MG tablet Take 1 tablet by mouth 2 (two) times daily. 20 tablet 0   No current facility-administered medications for this visit.     Review of Systems Review of Systems  Constitutional: Negative.   Respiratory: Negative.   Cardiovascular: Negative.     Blood pressure (!) 144/84, pulse 90, temperature (!) 96.7 F (35.9 C), temperature source Oral, resp. rate 14, height 5' (1.524 m), weight 152 lb (68.9 kg), last menstrual period 02/13/2013.  Physical Exam Physical Exam  Constitutional: She is oriented to person, place, and time. She appears well-developed and well-nourished.  Eyes: Conjunctivae are normal. No scleral icterus.  Neck: Neck supple.  Cardiovascular: Normal  rate, regular rhythm and normal heart sounds.   Pulmonary/Chest: Effort normal and breath sounds normal.  Abdominal:    Lymphadenopathy:    She has no cervical adenopathy.       Right: No inguinal adenopathy present.       Left: No inguinal adenopathy present.  Neurological: She is alert and oriented to person, place, and time.  Skin: Skin is warm and dry.  Psychiatric: Her behavior is normal.    Data Reviewed Ultrasound examination showed no evidence of a deep abscess. No images, no charge.  Assessment    Cellulitis of the suprapubic skin.    Plan    Patient at this time does not warrant surgical exploration. She was instructed to call over  the weekend if she develops fever or chills.    Follow up Tuesday. Bactrium DS one po BID #20. RX for Norco #20 The patient is aware to use a heating pad as needed for comfort.   This information has been scribed by Ples SpecterJessica Qualls CMA.   Earline MayotteByrnett, Lynda Capistran W 09/23/2016, 9:59 AM

## 2016-09-22 NOTE — Progress Notes (Signed)
Pre-visit discussion using our clinic review tool. No additional management support is needed unless otherwise documented below in the visit note.[id number]

## 2016-09-22 NOTE — Patient Instructions (Signed)
The patient is aware to call back for any questions or concerns. The patient is aware to use a heating pad as needed for comfort.  

## 2016-09-22 NOTE — Progress Notes (Signed)
Patient ID: Deborah Bartlett, female   DOB: 22-Jul-1961, 56 y.o.   MRN: [id number]   Subjective:    Patient ID: Deborah Bartlett, female    DOB: [birth year], 56 y.o.   MRN: [id number]  HPI  Patient here for her physical exam.  Planning to go to [place] next week.  Received first hepatitis A injection.  Due second in 02/2017.  States sugars have been doing relatively well.  Blood sugar averaging in 130s.  Last a1c 6.2.  Also has partial left rotator cuff tear.  Seeing ortho.  Home exercises.  S/p injection.  She also reports some infection and drainage - suprapubic area.  Noticed several days ago.  Has worsened over the last 48 hours.  No fever.  Eating and drinking well.     Past Medical History:  Diagnosis Date  . Diabetes mellitus without complication ([place])   . GERD (gastroesophageal reflux disease)   . Hypercholesteremia   . Hypertension   . Migraine   . Perimenopausal 2014   Past Surgical History:  Procedure Laterality Date  . 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 [place]  . COLONOSCOPY  2014   Dr Bary Castilla   Family History  Problem Relation Age of Onset  . Hypertension Mother   . Diabetes Mother   . Diabetes Maternal Grandmother   . Diabetes Maternal Grandfather   . Breast cancer Neg Hx   . Colon cancer Neg Hx    Social History   Social History  . Marital status: Married    Spouse name: N/A  . Number of children: N/A  . Years of education: N/A   Social History Main Topics  . Smoking status: Never Smoker  . Smokeless tobacco: Never Used  . Alcohol use No  . Drug use: No  . Sexual activity: Not Asked   Other Topics Concern  . None   Social History Narrative  . None    Outpatient Encounter Prescriptions as of 09/22/2016  Medication Sig  . aspirin 81 MG tablet Take 81 mg by mouth daily.  Marland Kitchen atorvastatin (LIPITOR) 20 MG tablet Take 1 tablet (20 mg total) by mouth daily.  . ferrous sulfate 325 (65 FE) MG EC tablet Take  325 mg by mouth every other day.  Marland Kitchen glucose blood test strip Use as instructed to check blood sugars 3-4 times a week, twice a day.  E11.9 Dispense One Touch verio test strips  . Lancets (ONETOUCH ULTRASOFT) lancets Use as instructed to check Blood sugars 3-4 times a week, twice a day.  Dispense One Touch brand. E11.9  . losartan-hydrochlorothiazide (HYZAAR) 100-25 MG tablet Take 1 tablet by mouth daily.  . Magnesium 400 MG TABS Take 1 tablet by mouth daily.   . Multiple Vitamin (MULTIVITAMIN) capsule Take 1 capsule by mouth daily.  Marland Kitchen omeprazole (PRILOSEC) 20 MG capsule Take 20 mg by mouth daily.  Marland Kitchen tolterodine (DETROL) 2 MG tablet TAKE 1 TABLET (2 MG TOTAL) BY MOUTH 2 (TWO) TIMES DAILY.  . [DISCONTINUED] tolterodine (DETROL) 2 MG tablet TAKE 1 TABLET (2 MG TOTAL) BY MOUTH 2 (TWO) TIMES DAILY.   No facility-administered encounter medications on file as of 09/22/2016.     Review of Systems  Constitutional: Negative for appetite change and unexpected weight change.  HENT: Negative for congestion and sinus pressure.   Eyes: Negative for pain and visual disturbance.  Respiratory: Negative for cough, chest tightness and shortness of breath.  Cardiovascular: Negative for chest pain, palpitations and leg swelling.  Gastrointestinal: Negative for abdominal pain, diarrhea, nausea and vomiting.  Genitourinary: Negative for difficulty urinating and dysuria.  Musculoskeletal: Negative for back pain and joint swelling.       Left rotator cuff tear.  Seeing ortho.   Skin:       Infection - suprapubic region.   Neurological: Negative for dizziness, light-headedness and headaches.  Hematological: Negative for adenopathy. Does not bruise/bleed easily.  Psychiatric/Behavioral: Negative for agitation and dysphoric mood.       Objective:    Physical Exam  Constitutional: She is oriented to person, place, and time. She appears well-developed and well-nourished. No distress.  HENT:  Nose: Nose normal.    Mouth/Throat: Oropharynx is clear and moist.  Eyes: Right eye exhibits no discharge. Left eye exhibits no discharge. No scleral icterus.  Neck: Neck supple. No thyromegaly present.  Cardiovascular: Normal rate and regular rhythm.   Pulmonary/Chest: Breath sounds normal. No accessory muscle usage. No tachypnea. No respiratory distress. She has no decreased breath sounds. She has no wheezes. She has no rhonchi. Right breast exhibits no inverted nipple, no mass, no nipple discharge and no tenderness (no axillary adenopathy). Left breast exhibits no inverted nipple, no mass, no nipple discharge and no tenderness (no axilarry adenopathy).  Abdominal: Soft. Bowel sounds are normal. There is no tenderness.  Musculoskeletal: She exhibits no edema or tenderness.  Lymphadenopathy:    She has no cervical adenopathy.  Neurological: She is alert and oriented to person, place, and time.  Skin: Skin is warm. No rash noted.  Increased erythema and single drainage site - suprapubic region.  Fullness in this area.    Psychiatric: She has a normal mood and affect. Her behavior is normal.    BP 120/72 (BP Location: Left Arm, Patient Position: Sitting, Cuff Size: Large)   Pulse 97   Temp 98 F (36.7 C) (Oral)   Resp 16   Ht 5' (1.524 m)   Wt 153 lb 6.4 oz (69.6 kg)   LMP 02/13/2013   SpO2 100%   BMI 29.96 kg/m  Wt Readings from Last 3 Encounters:  09/22/16 152 lb (68.9 kg)  09/22/16 153 lb 6.4 oz (69.6 kg)  07/13/16 149 lb 12.8 oz (67.9 kg)     Lab Results  Component Value Date   WBC 4.2 07/13/2016   HGB 12.8 07/13/2016   HCT 38.6 07/13/2016   PLT 286.0 07/13/2016   GLUCOSE 99 07/13/2016   CHOL 196 07/13/2016   TRIG 140.0 07/13/2016   HDL 55.20 07/13/2016   LDLDIRECT 124.2 03/11/2014   LDLCALC 113 (H) 07/13/2016   ALT 18 07/13/2016   AST 16 07/13/2016   NA 141 07/13/2016   K 4.4 07/13/2016   CL 103 07/13/2016   CREATININE 0.76 07/13/2016   BUN 18 07/13/2016   CO2 31 07/13/2016   TSH  2.39 01/08/2016   HGBA1C 6.3 07/13/2016    Mm Digital Screening Bilateral  Result Date: 09/01/2016 CLINICAL DATA:  Screening. EXAM: DIGITAL SCREENING BILATERAL MAMMOGRAM WITH CAD COMPARISON:  Previous exam(s). ACR Breast Density Category c: The breast tissue is heterogeneously dense, which may obscure small masses. FINDINGS: There are no findings suspicious for malignancy. Images were processed with CAD. IMPRESSION: No mammographic evidence of malignancy. A result letter of this screening mammogram will be mailed directly to the patient. RECOMMENDATION: Screening mammogram in one year. (Code:SM-B-01Y) BI-RADS CATEGORY  1: Negative. Electronically Signed   By: Polly Cobia.D.  On: 09/01/2016 08:11       Assessment & Plan:   Problem List Items Addressed This Visit    Abscess    Referred to surgery as outlined.  Seeing her now.        Relevant Orders   Ambulatory referral to General Surgery   Cellulitis of suprapubic region    Exam as outlined.  Worsened over the last couple of days.  Single draining site - abscess.  Discussed with Dr Bary Castilla.  Agreed to see pt today.  Will need abx.  Follow.       Diabetes mellitus ([place])    Low carb diet and exercise.  Last a1c 6.3.  Follow met b and a1c.       Essential hypertension, benign    Blood pressure under good control.  Continue same medication regimen.  Follow pressures.  Follow metabolic panel.        GERD (gastroesophageal reflux disease)    Controlled on omeprazole.        Health care maintenance    Physical today 09/22/16.  Mammogram 09/01/16 - Birads I.  PAP 03/10/15 negative with negative HPV.  Wants pap.  Hold today.  Will do pap at next visit.        Hypercholesterolemia    Low cholesterol diet and exercise.  On lipitor.  Follow lipid panel and liver function tests.        Left shoulder pain    Evaluated by ortho (Dr Roland Rack).  Had partial rotator cuff tear.  S/p injection.  Continue f/u with ortho.            Einar Pheasant, MD

## 2016-09-22 NOTE — Assessment & Plan Note (Addendum)
Physical today 09/22/16.  Mammogram 09/01/16 - Birads I.  PAP 03/10/15 negative with negative HPV.  Wants pap.  Hold today.  Will do pap at next visit.

## 2016-09-23 DIAGNOSIS — L03319 Cellulitis of trunk, unspecified: Secondary | ICD-10-CM | POA: Insufficient documentation

## 2016-09-23 HISTORY — DX: Cellulitis of trunk, unspecified: L03.319

## 2016-09-25 ENCOUNTER — Encounter: Payer: Self-pay | Admitting: Internal Medicine

## 2016-09-25 NOTE — Assessment & Plan Note (Signed)
Low carb diet and exercise.  Last a1c 6.3.  Follow met b and a1c.

## 2016-09-25 NOTE — Assessment & Plan Note (Signed)
Low cholesterol diet and exercise.  On lipitor.  Follow lipid panel and liver function tests.   

## 2016-09-25 NOTE — Assessment & Plan Note (Signed)
Blood pressure under good control.  Continue same medication regimen.  Follow pressures.  Follow metabolic panel.   

## 2016-09-25 NOTE — Assessment & Plan Note (Signed)
Controlled on omeprazole.   

## 2016-09-25 NOTE — Assessment & Plan Note (Signed)
Exam as outlined.  Worsened over the last couple of days.  Single draining site - abscess.  Discussed with Dr Lemar LivingsByrnett.  Agreed to see pt today.  Will need abx.  Follow.

## 2016-09-25 NOTE — Assessment & Plan Note (Signed)
Evaluated by ortho (Dr Joice LoftsPoggi).  Had partial rotator cuff tear.  S/p injection.  Continue f/u with ortho.

## 2016-09-25 NOTE — Assessment & Plan Note (Signed)
Referred to surgery as outlined.  Seeing her now.

## 2016-09-27 ENCOUNTER — Ambulatory Visit: Payer: BC Managed Care – PPO | Admitting: General Surgery

## 2016-10-18 ENCOUNTER — Telehealth: Payer: Self-pay | Admitting: *Deleted

## 2016-10-18 ENCOUNTER — Encounter: Payer: Self-pay | Admitting: *Deleted

## 2016-10-18 NOTE — Telephone Encounter (Signed)
She states the area is much better. She had a good trip. She does not want a f/u at this time. She is aware to monitor the area for changes.The patient is aware to call back for any questions or new concerns.

## 2016-10-18 NOTE — Telephone Encounter (Signed)
-----   Message from Earline MayotteJeffrey W Byrnett, MD sent at 09/30/2016  8:20 PM EST ----- Patient missed f/u appt prior to mission trip to [place] on Feb 6.  Call the week of 2/26 to see how she did.

## 2016-11-13 ENCOUNTER — Other Ambulatory Visit: Payer: Self-pay | Admitting: Internal Medicine

## 2016-12-05 ENCOUNTER — Ambulatory Visit: Payer: BC Managed Care – PPO | Admitting: Internal Medicine

## 2016-12-15 ENCOUNTER — Other Ambulatory Visit: Payer: Self-pay | Admitting: Internal Medicine

## 2017-02-18 ENCOUNTER — Other Ambulatory Visit: Payer: Self-pay | Admitting: Internal Medicine

## 2017-02-23 ENCOUNTER — Ambulatory Visit: Payer: BC Managed Care – PPO | Admitting: Internal Medicine

## 2017-02-23 ENCOUNTER — Other Ambulatory Visit: Payer: Self-pay | Admitting: Internal Medicine

## 2017-03-07 ENCOUNTER — Other Ambulatory Visit: Payer: Self-pay | Admitting: Internal Medicine

## 2017-04-20 ENCOUNTER — Other Ambulatory Visit: Payer: Self-pay | Admitting: Internal Medicine

## 2017-04-28 ENCOUNTER — Ambulatory Visit (INDEPENDENT_AMBULATORY_CARE_PROVIDER_SITE_OTHER): Payer: BC Managed Care – PPO

## 2017-04-28 ENCOUNTER — Encounter: Payer: Self-pay | Admitting: Internal Medicine

## 2017-04-28 ENCOUNTER — Ambulatory Visit (INDEPENDENT_AMBULATORY_CARE_PROVIDER_SITE_OTHER): Payer: BC Managed Care – PPO | Admitting: Internal Medicine

## 2017-04-28 VITALS — BP 124/74 | HR 98 | Temp 98.6°F | Resp 14 | Ht 60.0 in | Wt 160.8 lb

## 2017-04-28 DIAGNOSIS — I10 Essential (primary) hypertension: Secondary | ICD-10-CM | POA: Diagnosis not present

## 2017-04-28 DIAGNOSIS — R200 Anesthesia of skin: Secondary | ICD-10-CM

## 2017-04-28 DIAGNOSIS — M25511 Pain in right shoulder: Secondary | ICD-10-CM

## 2017-04-28 DIAGNOSIS — E7800 Pure hypercholesterolemia, unspecified: Secondary | ICD-10-CM | POA: Diagnosis not present

## 2017-04-28 DIAGNOSIS — K219 Gastro-esophageal reflux disease without esophagitis: Secondary | ICD-10-CM

## 2017-04-28 DIAGNOSIS — N3281 Overactive bladder: Secondary | ICD-10-CM

## 2017-04-28 DIAGNOSIS — E119 Type 2 diabetes mellitus without complications: Secondary | ICD-10-CM

## 2017-04-28 MED ORDER — TIZANIDINE HCL 2 MG PO TABS: ORAL_TABLET | ORAL | 0 refills | Status: DC

## 2017-04-28 MED ORDER — SOLIFENACIN SUCCINATE 5 MG PO TABS: 5.0000 mg | ORAL_TABLET | Freq: Every day | ORAL | 2 refills | Status: DC

## 2017-04-28 NOTE — Progress Notes (Signed)
Pre-visit discussion using our clinic review tool. No additional management support is needed unless otherwise documented below in the visit note.  

## 2017-04-28 NOTE — Progress Notes (Signed)
Patient ID: Deborah Bartlett, female   DOB: May 14, 1961, 56 y.o.   MRN: [id number]   Subjective:    Patient ID: Deborah Bartlett, female    DOB: June 27, 1961, 56 y.o.   MRN: [id number]  HPI  Patient here for a scheduled follow up.  Previously had left shoulder pain.  Now is having more problems with her right shoulder.  Has been present for 2-3 weeks.  Taking 2 alleve 2-3x/day.  Helps some.  No chest pain.  No sob.  No acid reflux.  No abdominal pain.  Bowels moving.  Has noticed numbness and cold feeling - right lateral thigh.  Intermittent.  No pain.  No triggers.  detrol was helping with her urinary issues, but no longer working.  Discussed changing medications.     Past Medical History:  Diagnosis Date  . Diabetes mellitus without complication ([place])   . GERD (gastroesophageal reflux disease)   . Hypercholesteremia   . Hypertension   . Migraine   . Perimenopausal 2014   Past Surgical History:  Procedure Laterality Date  . 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 [place]  . COLONOSCOPY  2014   Dr Bary Castilla   Family History  Problem Relation Age of Onset  . Hypertension Mother   . Diabetes Mother   . Diabetes Maternal Grandmother   . Diabetes Maternal Grandfather   . Breast cancer Neg Hx   . Colon cancer Neg Hx    Social History   Social History  . Marital status: Married    Spouse name: N/A  . Number of children: N/A  . Years of education: N/A   Social History Main Topics  . Smoking status: Never Smoker  . Smokeless tobacco: Never Used  . Alcohol use No  . Drug use: No  . Sexual activity: Not Asked   Other Topics Concern  . None   Social History Narrative  . None    Outpatient Encounter Prescriptions as of 04/28/2017  Medication Sig  . aspirin 81 MG tablet Take 81 mg by mouth daily.  Marland Kitchen atorvastatin (LIPITOR) 20 MG tablet TAKE 1 TABLET (20 MG TOTAL) BY MOUTH DAILY.  . ferrous sulfate 325 (65 FE) MG EC tablet Take 325  mg by mouth every other day.  Marland Kitchen glucose blood test strip Use as instructed to check blood sugars 3-4 times a week, twice a day.  E11.9 Dispense One Touch verio test strips  . HYDROcodone-acetaminophen (NORCO) 10-325 MG tablet Take 1 tablet by mouth at bedtime.  . Lancets (ONETOUCH ULTRASOFT) lancets Use as instructed to check Blood sugars 3-4 times a week, twice a day.  Dispense One Touch brand. E11.9  . losartan-hydrochlorothiazide (HYZAAR) 100-25 MG tablet TAKE 1 TABLET BY MOUTH DAILY.  . Magnesium 400 MG TABS Take 1 tablet by mouth daily.   . Multiple Vitamin (MULTIVITAMIN) capsule Take 1 capsule by mouth daily.  Marland Kitchen omeprazole (PRILOSEC) 20 MG capsule Take 20 mg by mouth daily.  Marland Kitchen sulfamethoxazole-trimethoprim (BACTRIM DS,SEPTRA DS) 800-160 MG tablet Take 1 tablet by mouth 2 (two) times daily.  . [DISCONTINUED] tolterodine (DETROL) 2 MG tablet TAKE 1 TABLET (2 MG TOTAL) BY MOUTH 2 (TWO) TIMES DAILY.  Marland Kitchen solifenacin (VESICARE) 5 MG tablet Take 1 tablet (5 mg total) by mouth daily.  Marland Kitchen tiZANidine (ZANAFLEX) 2 MG tablet One tablet before bed as needed   No facility-administered encounter medications on file as of 04/28/2017.     Review  of Systems  Constitutional: Negative for appetite change and unexpected weight change.  HENT: Negative for congestion and sinus pressure.   Respiratory: Negative for cough, chest tightness and shortness of breath.   Cardiovascular: Negative for chest pain, palpitations and leg swelling.  Gastrointestinal: Negative for abdominal pain, diarrhea, nausea and vomiting.  Genitourinary: Negative for difficulty urinating and dysuria.  Musculoskeletal: Negative for back pain.       Right shoulder pain as outlined.    Skin: Negative for color change and rash.  Neurological: Negative for dizziness, light-headedness and headaches.  Psychiatric/Behavioral: Negative for agitation and dysphoric mood.       Objective:    Physical Exam  Constitutional: She appears  well-developed and well-nourished. No distress.  HENT:  Nose: Nose normal.  Mouth/Throat: Oropharynx is clear and moist.  Neck: Neck supple. No thyromegaly present.  Cardiovascular: Normal rate and regular rhythm.   Pulmonary/Chest: Breath sounds normal. No respiratory distress. She has no wheezes.  Abdominal: Soft. Bowel sounds are normal. There is no tenderness.  Musculoskeletal: She exhibits no edema or tenderness.  Increased pain - right shoulder with adduction especially above 90 degrees.  Decreased pain - with passive rom.    Lymphadenopathy:    She has no cervical adenopathy.  Neurological:  No significant decreased sensation - right lateral thigh.    Skin: No rash noted. No erythema.  Psychiatric: She has a normal mood and affect. Her behavior is normal.    BP 124/74 (BP Location: Left Arm, Patient Position: Sitting, Cuff Size: Normal)   Pulse 98   Temp 98.6 F (37 C) (Oral)   Resp 14   Ht 5' (1.524 m)   Wt 160 lb 12.8 oz (72.9 kg)   LMP 02/13/2013   SpO2 100%   BMI 31.40 kg/m  Wt Readings from Last 3 Encounters:  04/28/17 160 lb 12.8 oz (72.9 kg)  09/22/16 152 lb (68.9 kg)  09/22/16 153 lb 6.4 oz (69.6 kg)     Lab Results  Component Value Date   WBC 4.2 07/13/2016   HGB 12.8 07/13/2016   HCT 38.6 07/13/2016   PLT 286.0 07/13/2016   GLUCOSE 99 07/13/2016   CHOL 196 07/13/2016   TRIG 140.0 07/13/2016   HDL 55.20 07/13/2016   LDLDIRECT 124.2 03/11/2014   LDLCALC 113 (H) 07/13/2016   ALT 18 07/13/2016   AST 16 07/13/2016   NA 141 07/13/2016   K 4.4 07/13/2016   CL 103 07/13/2016   CREATININE 0.76 07/13/2016   BUN 18 07/13/2016   CO2 31 07/13/2016   TSH 2.39 01/08/2016   HGBA1C 6.3 07/13/2016    Mm Digital Screening Bilateral  Result Date: 09/01/2016 CLINICAL DATA:  Screening. EXAM: DIGITAL SCREENING BILATERAL MAMMOGRAM WITH CAD COMPARISON:  Previous exam(s). ACR Breast Density Category c: The breast tissue is heterogeneously dense, which may obscure  small masses. FINDINGS: There are no findings suspicious for malignancy. Images were processed with CAD. IMPRESSION: No mammographic evidence of malignancy. A result letter of this screening mammogram will be mailed directly to the patient. RECOMMENDATION: Screening mammogram in one year. (Code:SM-B-01Y) BI-RADS CATEGORY  1: Negative. Electronically Signed   By: Lovey Newcomer M.D.   On: 09/01/2016 08:11       Assessment & Plan:   Problem List Items Addressed This Visit    Diabetes mellitus ([place])    Low carb diet and exercise.  Follow met b and a1c.        Relevant Orders   Hemoglobin A1c  Basic metabolic panel   Microalbumin / creatinine urine ratio   Essential hypertension, benign    Blood pressure under good control.  Continue same medication regimen.  Follow pressures.  Follow metabolic panel.        Relevant Orders   TSH   GERD (gastroesophageal reflux disease)    Controlled on omeprazole.  Follow.        Hypercholesterolemia    On lipitor.  Low cholesterol diet and exercise.  Follow lipid panel and liver function tests.        Relevant Orders   Hepatic function panel   Lipid panel   Overactive bladder    detrol helped initially.  Now not working.  Concern over cost of medication.  Will give her a trial of vesicare.  Follow.        Thigh numbness    Reports thigh numbness and some cold sensation as outlined.  Dicussed further w/up.  Discussed nerve conduction study.  She wants to monitor for now.  Follow.         Other Visit Diagnoses    Right shoulder pain, unspecified chronicity    -  Primary   Relevant Orders   DG Shoulder Right (Completed)       Einar Pheasant, MD

## 2017-04-30 ENCOUNTER — Encounter: Payer: Self-pay | Admitting: Internal Medicine

## 2017-04-30 DIAGNOSIS — N3281 Overactive bladder: Secondary | ICD-10-CM | POA: Insufficient documentation

## 2017-04-30 DIAGNOSIS — R200 Anesthesia of skin: Secondary | ICD-10-CM | POA: Insufficient documentation

## 2017-04-30 NOTE — Assessment & Plan Note (Signed)
Reports thigh numbness and some cold sensation as outlined.  Dicussed further w/up.  Discussed nerve conduction study.  She wants to monitor for now.  Follow.

## 2017-04-30 NOTE — Assessment & Plan Note (Signed)
Blood pressure under good control.  Continue same medication regimen.  Follow pressures.  Follow metabolic panel.   

## 2017-04-30 NOTE — Assessment & Plan Note (Signed)
On lipitor.  Low cholesterol diet and exercise.  Follow lipid panel and liver function tests.   

## 2017-04-30 NOTE — Assessment & Plan Note (Signed)
Low carb diet and exercise.  Follow met b and a1c.   

## 2017-04-30 NOTE — Assessment & Plan Note (Signed)
Controlled on omeprazole.  Follow.  

## 2017-04-30 NOTE — Assessment & Plan Note (Signed)
detrol helped initially.  Now not working.  Concern over cost of medication.  Will give her a trial of vesicare.  Follow.

## 2017-05-02 ENCOUNTER — Other Ambulatory Visit: Payer: Self-pay | Admitting: Internal Medicine

## 2017-05-02 DIAGNOSIS — M25511 Pain in right shoulder: Secondary | ICD-10-CM

## 2017-05-02 NOTE — Progress Notes (Signed)
Order placed for ortho referral.   

## 2017-05-08 ENCOUNTER — Other Ambulatory Visit (INDEPENDENT_AMBULATORY_CARE_PROVIDER_SITE_OTHER): Payer: BC Managed Care – PPO

## 2017-05-08 DIAGNOSIS — E7800 Pure hypercholesterolemia, unspecified: Secondary | ICD-10-CM

## 2017-05-08 DIAGNOSIS — E119 Type 2 diabetes mellitus without complications: Secondary | ICD-10-CM

## 2017-05-08 DIAGNOSIS — I10 Essential (primary) hypertension: Secondary | ICD-10-CM

## 2017-05-08 LAB — BASIC METABOLIC PANEL
BUN: 20 mg/dL (ref 6–23)
CHLORIDE: 103 meq/L (ref 96–112)
CO2: 29 meq/L (ref 19–32)
CREATININE: 0.81 mg/dL (ref 0.40–1.20)
Calcium: 10.4 mg/dL (ref 8.4–10.5)
GFR: 78.03 mL/min (ref >60.00)
GLUCOSE: 91 mg/dL (ref 70–99)
Potassium: 3.8 mEq/L (ref 3.5–5.1)
SODIUM: 139 meq/L (ref 135–145)

## 2017-05-08 LAB — HEPATIC FUNCTION PANEL
ALBUMIN: 4.4 g/dL (ref 3.5–5.2)
ALK PHOS: 121 U/L — AB (ref 39–117)
ALT: 29 U/L (ref 0–35)
AST: 21 U/L (ref 0–37)
Bilirubin, Direct: 0.1 mg/dL (ref 0.0–0.3)
Total Bilirubin: 0.4 mg/dL (ref 0.2–1.2)
Total Protein: 7.6 g/dL (ref 6.0–8.3)

## 2017-05-08 LAB — TSH: TSH: 3.14 u[IU]/mL (ref 0.35–4.50)

## 2017-05-08 LAB — HEMOGLOBIN A1C: HEMOGLOBIN A1C: 6.3 % (ref 4.6–6.5)

## 2017-05-08 LAB — LIPID PANEL
CHOLESTEROL: 219 mg/dL — AB (ref 0–200)
HDL: 52.7 mg/dL (ref >39.00)
NonHDL: 166.45
Total CHOL/HDL Ratio: 4
Triglycerides: 324 mg/dL — ABNORMAL HIGH (ref 0.0–149.0)
VLDL: 64.8 mg/dL — ABNORMAL HIGH (ref 0.0–40.0)

## 2017-05-08 LAB — MICROALBUMIN / CREATININE URINE RATIO
CREATININE, U: 241.9 mg/dL
MICROALB/CREAT RATIO: 0.7 mg/g (ref 0.0–30.0)
Microalb, Ur: 1.6 mg/dL (ref 0.0–1.9)

## 2017-05-08 LAB — LDL CHOLESTEROL, DIRECT: Direct LDL: 114 mg/dL

## 2017-05-09 ENCOUNTER — Telehealth: Payer: Self-pay | Admitting: Internal Medicine

## 2017-05-09 ENCOUNTER — Other Ambulatory Visit: Payer: Self-pay | Admitting: Internal Medicine

## 2017-05-09 DIAGNOSIS — R748 Abnormal levels of other serum enzymes: Secondary | ICD-10-CM

## 2017-05-09 NOTE — Telephone Encounter (Signed)
See results note. 

## 2017-05-09 NOTE — Telephone Encounter (Signed)
Pt called back returning your call. Please advise, thank you!  Call pt @ [phone]

## 2017-05-09 NOTE — Telephone Encounter (Signed)
See result note.  

## 2017-05-09 NOTE — Progress Notes (Signed)
Order placed for f/u liver panel.  

## 2017-05-09 NOTE — Telephone Encounter (Signed)
Pt returned office phone call. She believes the call is for her lab results. Pt states it is ok to lm on vm .

## 2017-05-31 ENCOUNTER — Other Ambulatory Visit: Payer: Self-pay

## 2017-05-31 MED ORDER — SOLIFENACIN SUCCINATE 5 MG PO TABS: 5.0000 mg | ORAL_TABLET | Freq: Every day | ORAL | 0 refills | Status: DC

## 2017-06-02 ENCOUNTER — Other Ambulatory Visit: Payer: Self-pay

## 2017-06-02 ENCOUNTER — Ambulatory Visit: Payer: BC Managed Care – PPO | Admitting: Internal Medicine

## 2017-06-02 ENCOUNTER — Telehealth: Payer: Self-pay | Admitting: Internal Medicine

## 2017-06-02 MED ORDER — ATORVASTATIN CALCIUM 20 MG PO TABS: 20.0000 mg | ORAL_TABLET | Freq: Every day | ORAL | 1 refills | Status: DC

## 2017-06-02 NOTE — Telephone Encounter (Signed)
FYI

## 2017-06-02 NOTE — Telephone Encounter (Signed)
FYI - Pt son called and cancelled appt. Pt is sick in the bathroom.

## 2017-06-07 ENCOUNTER — Other Ambulatory Visit: Payer: Self-pay | Admitting: Internal Medicine

## 2017-06-09 ENCOUNTER — Other Ambulatory Visit: Payer: Self-pay | Admitting: Internal Medicine

## 2017-08-03 ENCOUNTER — Other Ambulatory Visit: Payer: Self-pay | Admitting: Internal Medicine

## 2017-08-03 MED ORDER — SOLIFENACIN SUCCINATE 5 MG PO TABS: 5.0000 mg | ORAL_TABLET | Freq: Every day | ORAL | 1 refills | Status: DC

## 2017-08-14 ENCOUNTER — Encounter: Payer: Self-pay | Admitting: Internal Medicine

## 2017-08-14 NOTE — Telephone Encounter (Signed)
I spoke with patient & she has been advised to go to urgent care/acute care or ER today to have headache & BP evaluated since she is already on the maximum dose & given her symptoms. Pt states that she will do her best & will call us back or send a mychart on Wednesday with an update. Pt aware that Dr. Lorin PicketScott is off all this week as well.

## 2017-08-14 NOTE — Telephone Encounter (Signed)
If she is having headache and elevated blood pressure, she needs to be evaluated.  Needs evaluation today.  Unable to double up of her medication - she is on the maximum medication dose of the blood pressure medication she is on.

## 2017-08-16 NOTE — Telephone Encounter (Signed)
Spoken to patient, informed patient to go to urgent care to be seen for her headaches and BP, due to not having a[ppointments til mid January.  BP has been running above 150/90.  Patient feels fine at the moment.  Patient refused to go to UC/ED right now. She stated that if SX return or becomes worse she WILL go to UC/ED.

## 2017-08-16 NOTE — Telephone Encounter (Signed)
Copied from CRM [id number]. Topic: Appointment Scheduling - Scheduling Inquiry for Clinic >> Aug 16, 2017  4:36 PM Arlyss Gandyichardson, Taren N, NT wrote: Reason for CRM: Patient calling today and her bp is still being elevated and she wants to see Dr. Lorin PicketScott but she does not have any appointments available for awhile. Pt would like to see if she can be worked in to see her? She did not go to urgent care on Monday because her headache went away. Please advise.

## 2017-08-17 NOTE — Telephone Encounter (Signed)
Patient feels fine at the moment.  Patient refused to go to UC/ED right now. She stated that if SX return or becomes worse she WILL go to UC/ED.

## 2017-08-17 NOTE — Telephone Encounter (Signed)
Please call and let pt know that I am out of the office the rest of the week and for part of next week (with New Years).  Given her elevated blood pressure and headache, she really needs to go ahead and be seen and then I can f/u on return to work.  I was not sure who was in the office today, so I am sending this to [employer]rock and Kathy.  Thanks

## 2017-08-21 NOTE — Telephone Encounter (Signed)
Please schedule her for Friday appt that is on hold.  If needs anything earlier, let me know.

## 2017-08-21 NOTE — Telephone Encounter (Signed)
Pt stated that she is out of town until later this week.. She stated that her blood pressure has come down and the headaches are dull.. Pt said that if you think she needs to be seen then she will come later

## 2017-08-23 ENCOUNTER — Telehealth: Payer: Self-pay | Admitting: Internal Medicine

## 2017-08-23 NOTE — Telephone Encounter (Signed)
Lm on home and cell phone to schedule appt  Will try again later

## 2017-08-23 NOTE — Telephone Encounter (Signed)
ERROR

## 2017-08-24 NOTE — Telephone Encounter (Signed)
Pt scheduled  

## 2017-08-25 ENCOUNTER — Ambulatory Visit: Payer: BC Managed Care – PPO | Admitting: Internal Medicine

## 2017-08-25 ENCOUNTER — Telehealth: Payer: Self-pay | Admitting: *Deleted

## 2017-08-25 NOTE — Telephone Encounter (Signed)
FYI

## 2017-08-25 NOTE — Telephone Encounter (Signed)
Copied from CRM [phone]. Topic: Quick Communication - Appointment Cancellation >> Aug 25, 2017 12:23 PM Percival SpanishKennedy, Cheryl W wrote: Patient called to cancel appointment scheduled for today 08/25/17 said she had to work over    [facility]

## 2017-10-13 ENCOUNTER — Other Ambulatory Visit: Payer: Self-pay | Admitting: Internal Medicine

## 2017-11-10 ENCOUNTER — Other Ambulatory Visit: Payer: Self-pay | Admitting: Internal Medicine

## 2017-12-02 ENCOUNTER — Other Ambulatory Visit: Payer: Self-pay | Admitting: Internal Medicine

## 2017-12-03 ENCOUNTER — Encounter: Payer: Self-pay | Admitting: Internal Medicine

## 2017-12-04 NOTE — Telephone Encounter (Signed)
Pt scheduled tomorrow

## 2017-12-04 NOTE — Telephone Encounter (Signed)
Friday morning patient started feeling bad with chills. She ran a low grade temp under 100. She vomited x1. Felt drained after she got sick and got a head ache which she thinks is from not eating. Patient stated she is no longer vomiting but on Saturday her foot became swollen and red. The swelling has went down enough for her to get her shoe on and go to work she was just concerned and thought she might should let someone look at her foot since she has a hx of cellulitis.

## 2017-12-04 NOTE — Telephone Encounter (Signed)
See if she can come on in for work in appt (12:15 -12:30).

## 2017-12-04 NOTE — Telephone Encounter (Signed)
Copied from CRM [id number]. Topic: Inquiry >> Dec 04, 2017 10:17 AM Terisa Starraylor, Brittany L wrote: Reason for CRM: Patient said that she sent Dr Lorin PicketScott a mychart message. She wants her to call her or the nurse to call her @ [phone] until 2pm, after that it would be her cell phone

## 2017-12-05 ENCOUNTER — Encounter: Payer: Self-pay | Admitting: Internal Medicine

## 2017-12-05 ENCOUNTER — Ambulatory Visit (INDEPENDENT_AMBULATORY_CARE_PROVIDER_SITE_OTHER): Payer: BC Managed Care – PPO | Admitting: Internal Medicine

## 2017-12-05 VITALS — BP 144/90 | HR 94 | Temp 97.7°F | Resp 18 | Wt 156.8 lb

## 2017-12-05 DIAGNOSIS — I10 Essential (primary) hypertension: Secondary | ICD-10-CM | POA: Diagnosis not present

## 2017-12-05 DIAGNOSIS — L03116 Cellulitis of left lower limb: Secondary | ICD-10-CM | POA: Diagnosis not present

## 2017-12-05 DIAGNOSIS — K219 Gastro-esophageal reflux disease without esophagitis: Secondary | ICD-10-CM | POA: Diagnosis not present

## 2017-12-05 DIAGNOSIS — E119 Type 2 diabetes mellitus without complications: Secondary | ICD-10-CM | POA: Diagnosis not present

## 2017-12-05 DIAGNOSIS — E7800 Pure hypercholesterolemia, unspecified: Secondary | ICD-10-CM | POA: Diagnosis not present

## 2017-12-05 LAB — BASIC METABOLIC PANEL
BUN: 22 mg/dL (ref 6–23)
CALCIUM: 10.2 mg/dL (ref 8.4–10.5)
CO2: 29 meq/L (ref 19–32)
CREATININE: 0.79 mg/dL (ref 0.40–1.20)
Chloride: 102 mEq/L (ref 96–112)
GFR: 80.14 mL/min (ref >60.00)
GLUCOSE: 92 mg/dL (ref 70–99)
Potassium: 3.9 mEq/L (ref 3.5–5.1)
SODIUM: 138 meq/L (ref 135–145)

## 2017-12-05 LAB — CBC WITH DIFFERENTIAL/PLATELET
BASOS PCT: 0.9 % (ref 0.0–3.0)
Basophils Absolute: 0.1 10*3/uL (ref 0.0–0.1)
EOS PCT: 3 % (ref 0.0–5.0)
Eosinophils Absolute: 0.2 10*3/uL (ref 0.0–0.7)
HCT: 38.2 % (ref 36.0–46.0)
HEMOGLOBIN: 12.8 g/dL (ref 12.0–15.0)
Lymphocytes Relative: 33.8 % (ref 12.0–46.0)
Lymphs Abs: 2.4 10*3/uL (ref 0.7–4.0)
MCHC: 33.6 g/dL (ref 30.0–36.0)
MCV: 85.7 fl (ref 78.0–100.0)
MONO ABS: 0.8 10*3/uL (ref 0.1–1.0)
MONOS PCT: 11.7 % (ref 3.0–12.0)
Neutro Abs: 3.5 10*3/uL (ref 1.4–7.7)
Neutrophils Relative %: 50.6 % (ref 43.0–77.0)
Platelets: 302 10*3/uL (ref 150.0–400.0)
RBC: 4.46 Mil/uL (ref 3.87–5.11)
RDW: 14 % (ref 11.5–15.5)
WBC: 7 10*3/uL (ref 4.0–10.5)

## 2017-12-05 LAB — HEPATIC FUNCTION PANEL
ALBUMIN: 4.1 g/dL (ref 3.5–5.2)
ALT: 24 U/L (ref 0–35)
AST: 19 U/L (ref 0–37)
Alkaline Phosphatase: 93 U/L (ref 39–117)
BILIRUBIN DIRECT: 0.1 mg/dL (ref 0.0–0.3)
BILIRUBIN TOTAL: 0.3 mg/dL (ref 0.2–1.2)
Total Protein: 7.2 g/dL (ref 6.0–8.3)

## 2017-12-05 LAB — HEMOGLOBIN A1C: Hgb A1c MFr Bld: 6.6 % — ABNORMAL HIGH (ref 4.6–6.5)

## 2017-12-05 MED ORDER — AMLODIPINE BESYLATE 5 MG PO TABS: 5.0000 mg | ORAL_TABLET | Freq: Every day | ORAL | 1 refills | Status: DC

## 2017-12-05 MED ORDER — DOXYCYCLINE HYCLATE 100 MG PO TABS: 100.0000 mg | ORAL_TABLET | Freq: Two times a day (BID) | ORAL | 0 refills | Status: DC

## 2017-12-05 NOTE — Patient Instructions (Signed)
Take a probiotic daily while you are on the antibiotic and for two weeks after completing the antibiotic   

## 2017-12-05 NOTE — Progress Notes (Addendum)
Patient ID: Deborah Bartlett, female   DOB: 25-Dec-1960, 57 y.o.   MRN: [id number]   Subjective:    Patient ID: Deborah Bartlett, female    DOB: 03-23-1961, 57 y.o.   MRN: [id number]  HPI  Patient here as a work in with concerns regarding lower extremity swelling and cellulitis.  She has had intermittent flares with cellulitis.  Last episode was this past week.  Persistent foot swelling and redness.  States that prior to her foot swelling and turning red, she will notice that she "feels bad".  Will have chills and then one episode of emesis.  Will have a headache that resolves on its on.  After this, will notice her foot swells and will then turn red.   She does have a history of migraine headaches.  States her headaches feel like the start of a migraine, but does not progress.  No dizziness.  No chest pain or sob.  Eating and drinking well.  Blood pressure has been elevated recently with most readings averaging 140s/90s.     Past Medical History:  Diagnosis Date  . Diabetes mellitus without complication ([place])   . GERD (gastroesophageal reflux disease)   . Hypercholesteremia   . Hypertension   . Migraine   . Perimenopausal 2014   Past Surgical History:  Procedure Laterality Date  . 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 [place]  . COLONOSCOPY  2014   Dr Bary Castilla   Family History  Problem Relation Age of Onset  . Hypertension Mother   . Diabetes Mother   . Diabetes Maternal Grandmother   . Diabetes Maternal Grandfather   . Breast cancer Neg Hx   . Colon cancer Neg Hx    Social History   Socioeconomic History  . Marital status: Married    Spouse name: Not on file  . Number of children: Not on file  . Years of education: Not on file  . Highest education level: Not on file  Occupational History  . Not on file  Social Needs  . Financial resource strain: Not on file  . Food insecurity:    Worry: Not on file    Inability: Not on  file  . Transportation needs:    Medical: Not on file    Non-medical: Not on file  Tobacco Use  . Smoking status: Never Smoker  . Smokeless tobacco: Never Used  Substance and Sexual Activity  . Alcohol use: No    Alcohol/week: 0.0 oz  . Drug use: No  . Sexual activity: Not on file  Lifestyle  . Physical activity:    Days per week: Not on file    Minutes per session: Not on file  . Stress: Not on file  Relationships  . Social connections:    Talks on phone: Not on file    Gets together: Not on file    Attends religious service: Not on file    Active member of club or organization: Not on file    Attends meetings of clubs or organizations: Not on file    Relationship status: Not on file  Other Topics Concern  . Not on file  Social History Narrative  . Not on file    Outpatient Encounter Medications as of 12/05/2017  Medication Sig  . amLODipine (NORVASC) 5 MG tablet Take 1 tablet (5 mg total) by mouth daily.  Marland Kitchen aspirin 81 MG tablet Take 81 mg by mouth daily.  Marland Kitchen  atorvastatin (LIPITOR) 20 MG tablet TAKE 1 TABLET BY MOUTH EVERY DAY  . doxycycline (VIBRA-TABS) 100 MG tablet Take 1 tablet (100 mg total) by mouth 2 (two) times daily.  . ferrous sulfate 325 (65 FE) MG EC tablet Take 325 mg by mouth every other day.  Marland Kitchen glucose blood test strip Use as instructed to check blood sugars 3-4 times a week, twice a day.  E11.9 Dispense One Touch verio test strips  . Lancets (ONETOUCH ULTRASOFT) lancets Use as instructed to check Blood sugars 3-4 times a week, twice a day.  Dispense One Touch brand. E11.9  . losartan-hydrochlorothiazide (HYZAAR) 100-25 MG tablet TAKE 1 TABLET BY MOUTH EVERY DAY  . Magnesium 400 MG TABS Take 1 tablet by mouth daily.   . Multiple Vitamin (MULTIVITAMIN) capsule Take 1 capsule by mouth daily.  Marland Kitchen omeprazole (PRILOSEC) 20 MG capsule Take 20 mg by mouth daily.  . solifenacin (VESICARE) 5 MG tablet Take 1 tablet (5 mg total) by mouth daily.  Marland Kitchen tiZANidine (ZANAFLEX)  2 MG tablet One tablet before bed as needed  . [DISCONTINUED] sulfamethoxazole-trimethoprim (BACTRIM DS,SEPTRA DS) 800-160 MG tablet Take 1 tablet by mouth 2 (two) times daily.   No facility-administered encounter medications on file as of 12/05/2017.     Review of Systems  Constitutional: Negative for appetite change and fever.       Previous chills.    HENT: Negative for congestion and sinus pressure.   Respiratory: Negative for shortness of breath.   Cardiovascular: Positive for leg swelling. Negative for chest pain.  Gastrointestinal: Negative for diarrhea, nausea and vomiting.  Musculoskeletal: Negative for joint swelling and myalgias.  Skin: Negative for rash.       Increased erythema - foot.  No redness extending up the leg.   Neurological: Negative for dizziness.       Previous intermittent headaches.    Psychiatric/Behavioral: Negative for agitation and dysphoric mood.      Objective:     Blood pressure rechecked by me:  144/90  Physical Exam  Constitutional: She appears well-developed and well-nourished. No distress.  Neck: Neck supple. No thyromegaly present.  Cardiovascular: Normal rate and regular rhythm.  Pulmonary/Chest: Breath sounds normal. No respiratory distress. She has no wheezes.  Abdominal: Soft. Bowel sounds are normal. There is no tenderness.  Genitourinary:  Genitourinary Comments: Increased foot swelling and redness.    Lymphadenopathy:    She has no cervical adenopathy.  Skin: No rash noted. There is erythema.  Psychiatric: She has a normal mood and affect. Her behavior is normal.    BP (!) 144/90   Pulse 94   Temp 97.7 F (36.5 C) (Oral)   Resp 18   Wt 156 lb 12.8 oz (71.1 kg)   LMP 02/13/2013   SpO2 98%   BMI 30.62 kg/m  Wt Readings from Last 3 Encounters:  12/05/17 156 lb 12.8 oz (71.1 kg)  04/28/17 160 lb 12.8 oz (72.9 kg)  09/22/16 152 lb (68.9 kg)     Lab Results  Component Value Date   WBC 7.0 12/05/2017   HGB 12.8  12/05/2017   HCT 38.2 12/05/2017   PLT 302.0 12/05/2017   GLUCOSE 92 12/05/2017   CHOL 219 (H) 05/08/2017   TRIG 324.0 (H) 05/08/2017   HDL 52.70 05/08/2017   LDLDIRECT 114.0 05/08/2017   LDLCALC 113 (H) 07/13/2016   ALT 24 12/05/2017   AST 19 12/05/2017   NA 138 12/05/2017   K 3.9 12/05/2017   CL 102  12/05/2017   CREATININE 0.79 12/05/2017   BUN 22 12/05/2017   CO2 29 12/05/2017   TSH 3.14 05/08/2017   HGBA1C 6.6 (H) 12/05/2017   MICROALBUR 1.6 05/08/2017    Mm Digital Screening Bilateral  Result Date: 09/01/2016 CLINICAL DATA:  Screening. EXAM: DIGITAL SCREENING BILATERAL MAMMOGRAM WITH CAD COMPARISON:  Previous exam(s). ACR Breast Density Category c: The breast tissue is heterogeneously dense, which may obscure small masses. FINDINGS: There are no findings suspicious for malignancy. Images were processed with CAD. IMPRESSION: No mammographic evidence of malignancy. A result letter of this screening mammogram will be mailed directly to the patient. RECOMMENDATION: Screening mammogram in one year. (Code:SM-B-01Y) BI-RADS CATEGORY  1: Negative. Electronically Signed   By: Lovey Newcomer M.D.   On: 09/01/2016 08:11       Assessment & Plan:   Problem List Items Addressed This Visit    Cellulitis - Primary    Persistent swelling in her foot with associated redness.  Concern regarding cellulitis.  Elevate legs when sitting.  Doxycycline as directed.  Take probiotic as directed.  Follow.       Relevant Orders   CBC with Differential/Platelet (Completed)   Diabetes mellitus ([place])    Low carb diet and exercise.  Follow met b and a1c.       Relevant Orders   Hemoglobin A1c (Completed)   Essential hypertension, benign    Blood pressure elevated initially.  Recheck improved, but still elevated.  States has been elevated.  Add amlodipine 49m q day.  Continue losartan/hctz.  Follow for any increased swelling.  Follow pressures.  Follow metabolic panel.        Relevant Medications    amLODipine (NORVASC) 5 MG tablet   Other Relevant Orders   Basic metabolic panel (Completed)   GERD (gastroesophageal reflux disease)    Controlled on current regimen.  Follow.       Hypercholesterolemia    Low cholesterol diet and exercise.  Follow lipid panel.       Relevant Medications   amLODipine (NORVASC) 5 MG tablet   Other Relevant Orders   Hepatic function panel (Completed)       SEinar Pheasant MD

## 2017-12-06 ENCOUNTER — Encounter: Payer: Self-pay | Admitting: Internal Medicine

## 2017-12-08 ENCOUNTER — Encounter: Payer: Self-pay | Admitting: Internal Medicine

## 2017-12-08 NOTE — Assessment & Plan Note (Signed)
Controlled on current regimen.  Follow.  

## 2017-12-08 NOTE — Assessment & Plan Note (Signed)
Persistent swelling in her foot with associated redness.  Concern regarding cellulitis.  Elevate legs when sitting.  Doxycycline as directed.  Take probiotic as directed.  Follow.

## 2017-12-08 NOTE — Assessment & Plan Note (Signed)
Low carb diet and exercise.  Follow met b and a1c.  

## 2017-12-08 NOTE — Assessment & Plan Note (Signed)
Blood pressure elevated initially.  Recheck improved, but still elevated.  States has been elevated.  Add amlodipine 5mg  q day.  Continue losartan/hctz.  Follow for any increased swelling.  Follow pressures.  Follow metabolic panel.

## 2017-12-08 NOTE — Assessment & Plan Note (Signed)
Low cholesterol diet and exercise.  Follow lipid panel.   

## 2017-12-11 ENCOUNTER — Other Ambulatory Visit: Payer: Self-pay | Admitting: Internal Medicine

## 2017-12-27 ENCOUNTER — Other Ambulatory Visit: Payer: Self-pay | Admitting: Internal Medicine

## 2018-01-02 ENCOUNTER — Other Ambulatory Visit: Payer: Self-pay | Admitting: Student

## 2018-01-02 DIAGNOSIS — M7541 Impingement syndrome of right shoulder: Secondary | ICD-10-CM

## 2018-01-03 ENCOUNTER — Other Ambulatory Visit: Payer: Self-pay | Admitting: Student

## 2018-01-03 DIAGNOSIS — M7541 Impingement syndrome of right shoulder: Secondary | ICD-10-CM

## 2018-01-10 ENCOUNTER — Encounter: Payer: Self-pay | Admitting: Internal Medicine

## 2018-01-10 ENCOUNTER — Ambulatory Visit: Payer: BC Managed Care – PPO | Admitting: Internal Medicine

## 2018-01-10 ENCOUNTER — Other Ambulatory Visit: Payer: Self-pay | Admitting: Internal Medicine

## 2018-01-10 ENCOUNTER — Telehealth: Payer: Self-pay

## 2018-01-10 DIAGNOSIS — Z0289 Encounter for other administrative examinations: Secondary | ICD-10-CM

## 2018-01-10 NOTE — Telephone Encounter (Signed)
Copied from CRM [id number]. Topic: Appointment Scheduling - Scheduling Inquiry for Clinic >> Jan 10, 2018  1:19 PM Crist Infante wrote: Reason for CRM: pt called at 1:20 apologizing to no end that she missed her appt today.  Pt states something happened at the school today and she could not leave, it just completely escaped her. Pt needs another appt, but none available, can you work something out for her?

## 2018-01-10 NOTE — Telephone Encounter (Signed)
Can we try to reschedule?     

## 2018-01-10 NOTE — Telephone Encounter (Signed)
Lm to call back  and schedule

## 2018-01-12 ENCOUNTER — Ambulatory Visit
Admission: RE | Admit: 2018-01-12 | Discharge: 2018-01-12 | Disposition: A | Discharge status: Home / Self Care | Payer: BC Managed Care – PPO | Source: Ambulatory Visit | Attending: Student | Admitting: Student

## 2018-01-12 DIAGNOSIS — M7541 Impingement syndrome of right shoulder: Secondary | ICD-10-CM | POA: Diagnosis not present

## 2018-02-08 ENCOUNTER — Telehealth: Payer: Self-pay | Admitting: Internal Medicine

## 2018-02-08 ENCOUNTER — Encounter: Payer: Self-pay | Admitting: Internal Medicine

## 2018-02-08 NOTE — Telephone Encounter (Signed)
Copied from CRM [id number]. Topic: Inquiry >> Feb 08, 2018  1:35 PM Alexander BergeronBarksdale, Harvey B wrote: Reason for CRM: pt is having a cellulitis flare up and the pt is wondering if she should come in or if her pcp can prescribe something(medication) for her, pt is also going out of town on Sunday, contact pt to advise

## 2018-02-08 NOTE — Telephone Encounter (Signed)
Patient stated PCP DX her with cellulitis in 2014, started in left and runs up leg red and hot to touch, foot is swollen some. Advised patient if swollen hot to touch and she cannot get shoe on she needs to be evaluated now to make sure cellulitis and that she will need treatment.  Patient agreed and said she will go to UC or [facility] walk in no appointment available here in office.

## 2018-02-09 NOTE — Telephone Encounter (Signed)
See email from 02/08/18

## 2018-02-09 NOTE — Telephone Encounter (Signed)
Left message for patient to return call to office. 

## 2018-02-09 NOTE — Telephone Encounter (Signed)
Agree with need for evaluation.  Please call and confirm pt seen and doing ok.

## 2018-02-14 ENCOUNTER — Other Ambulatory Visit: Payer: Self-pay

## 2018-02-14 ENCOUNTER — Encounter: Payer: Self-pay | Admitting: *Deleted

## 2018-02-21 ENCOUNTER — Ambulatory Visit
Admission: RE | Admit: 2018-02-21 | Discharge: 2018-02-21 | Disposition: A | Discharge status: Home / Self Care | Payer: BC Managed Care – PPO | Source: Ambulatory Visit | Attending: Surgery | Admitting: Surgery

## 2018-02-21 ENCOUNTER — Encounter
Admission: RE | Disposition: A | Discharge status: Home / Self Care | Payer: Self-pay | Source: Ambulatory Visit | Attending: Surgery

## 2018-02-21 ENCOUNTER — Ambulatory Visit: Payer: BC Managed Care – PPO | Admitting: Anesthesiology

## 2018-02-21 DIAGNOSIS — K219 Gastro-esophageal reflux disease without esophagitis: Secondary | ICD-10-CM | POA: Insufficient documentation

## 2018-02-21 DIAGNOSIS — M659 Synovitis and tenosynovitis, unspecified: Secondary | ICD-10-CM | POA: Diagnosis not present

## 2018-02-21 DIAGNOSIS — Z79899 Other long term (current) drug therapy: Secondary | ICD-10-CM | POA: Insufficient documentation

## 2018-02-21 DIAGNOSIS — M25811 Other specified joint disorders, right shoulder: Secondary | ICD-10-CM | POA: Insufficient documentation

## 2018-02-21 DIAGNOSIS — E119 Type 2 diabetes mellitus without complications: Secondary | ICD-10-CM | POA: Insufficient documentation

## 2018-02-21 DIAGNOSIS — M7521 Bicipital tendinitis, right shoulder: Secondary | ICD-10-CM | POA: Diagnosis not present

## 2018-02-21 DIAGNOSIS — E785 Hyperlipidemia, unspecified: Secondary | ICD-10-CM | POA: Insufficient documentation

## 2018-02-21 DIAGNOSIS — M75121 Complete rotator cuff tear or rupture of right shoulder, not specified as traumatic: Secondary | ICD-10-CM | POA: Insufficient documentation

## 2018-02-21 DIAGNOSIS — I10 Essential (primary) hypertension: Secondary | ICD-10-CM | POA: Insufficient documentation

## 2018-02-21 DIAGNOSIS — M25511 Pain in right shoulder: Secondary | ICD-10-CM | POA: Diagnosis present

## 2018-02-21 HISTORY — PX: SHOULDER ARTHROSCOPY WITH ROTATOR CUFF REPAIR: SHX5685

## 2018-02-21 SURGERY — ARTHROSCOPY, SHOULDER, WITH ROTATOR CUFF REPAIR
Anesthesia: Regional | Site: Shoulder | Laterality: Right | Wound class: "Clean "

## 2018-02-21 MED ORDER — OXYCODONE HCL 5 MG PO TABS: 5.0000 mg | ORAL_TABLET | Freq: Once | ORAL | Status: DC | PRN

## 2018-02-21 MED ORDER — DEXTROSE 5 % IV SOLN
2000.0000 mg | Freq: Once | INTRAVENOUS | Status: AC
  Administered 2018-02-21: 2000 mg via INTRAVENOUS

## 2018-02-21 MED ORDER — DEXAMETHASONE SODIUM PHOSPHATE 10 MG/ML IJ SOLN: INTRAMUSCULAR | Status: DC | PRN

## 2018-02-21 MED ORDER — FENTANYL CITRATE (PF) 100 MCG/2ML IJ SOLN
INTRAMUSCULAR | Status: DC | PRN
  Administered 2018-02-21: 100 ug via INTRAVENOUS

## 2018-02-21 MED ORDER — ACETAMINOPHEN 325 MG PO TABS
325.0000 mg | ORAL_TABLET | ORAL | Status: DC | PRN
  Administered 2018-02-21: 325 mg via ORAL

## 2018-02-21 MED ORDER — LACTATED RINGERS IV SOLN: INTRAVENOUS | Status: DC

## 2018-02-21 MED ORDER — EPHEDRINE SULFATE 50 MG/ML IJ SOLN
INTRAMUSCULAR | Status: DC | PRN
  Administered 2018-02-21 (×5): 5 mg via INTRAVENOUS

## 2018-02-21 MED ORDER — OXYCODONE HCL 5 MG PO CAPS: 5.0000 mg | ORAL_CAPSULE | ORAL | 0 refills | Status: DC | PRN

## 2018-02-21 MED ORDER — DEXAMETHASONE SODIUM PHOSPHATE 4 MG/ML IJ SOLN
INTRAMUSCULAR | Status: DC | PRN
  Administered 2018-02-21: 4 mg via INTRAVENOUS
  Administered 2018-02-21: 4 mg via PERINEURAL

## 2018-02-21 MED ORDER — LIDOCAINE-EPINEPHRINE 1 %-1:100000 IJ SOLN
INTRAMUSCULAR | Status: DC | PRN
  Administered 2018-02-21: 10 mL

## 2018-02-21 MED ORDER — PROMETHAZINE HCL 25 MG/ML IJ SOLN: 6.2500 mg | INTRAMUSCULAR | Status: DC | PRN

## 2018-02-21 MED ORDER — FENTANYL CITRATE (PF) 100 MCG/2ML IJ SOLN: 25.0000 ug | INTRAMUSCULAR | Status: DC | PRN

## 2018-02-21 MED ORDER — ACETAMINOPHEN 160 MG/5ML PO SOLN: 325.0000 mg | ORAL | Status: DC | PRN

## 2018-02-21 MED ORDER — ROPIVACAINE HCL 5 MG/ML IJ SOLN
INTRAMUSCULAR | Status: DC | PRN
  Administered 2018-02-21: 40 mL via PERINEURAL

## 2018-02-21 MED ORDER — OXYCODONE HCL 5 MG/5ML PO SOLN: 5.0000 mg | Freq: Once | ORAL | Status: DC | PRN

## 2018-02-21 MED ORDER — LIDOCAINE HCL (CARDIAC) PF 100 MG/5ML IV SOSY
PREFILLED_SYRINGE | INTRAVENOUS | Status: DC | PRN
  Administered 2018-02-21: 40 mg via INTRATRACHEAL

## 2018-02-21 MED ORDER — LACTATED RINGERS IV SOLN
10.0000 mL/h | INTRAVENOUS | Status: DC
  Administered 2018-02-21: 13:00:00 via INTRAVENOUS

## 2018-02-21 MED ORDER — PROPOFOL 10 MG/ML IV BOLUS
INTRAVENOUS | Status: DC | PRN
  Administered 2018-02-21: 140 mg via INTRAVENOUS

## 2018-02-21 MED ORDER — MIDAZOLAM HCL 5 MG/5ML IJ SOLN
INTRAMUSCULAR | Status: DC | PRN
  Administered 2018-02-21: 2 mg via INTRAVENOUS

## 2018-02-21 SURGICAL SUPPLY — 37 items
ANCHOR JUGGERKNOT WTAP NDL 2.9 (Anchor) ×6 IMPLANT
ANCHOR SUT QUATTRO KNTLS 4.5 (Anchor) ×4 IMPLANT
BIT DRILL JUGRKNT W/NDL BIT2.9 (DRILL) ×1 IMPLANT
BLADE FULL RADIUS 3.5 (BLADE) ×3 IMPLANT
BUR ACROMIONIZER 4.0 (BURR) ×3 IMPLANT
CHLORAPREP W/TINT 26ML (MISCELLANEOUS) ×4 IMPLANT
COVER LIGHT HANDLE UNIVERSAL (MISCELLANEOUS) ×3 IMPLANT
COVER MAYO STAND STRL (DRAPES) ×3 IMPLANT
DRAPE IMP U-DRAPE 54X76 (DRAPES) ×6 IMPLANT
DRILL JUGGERKNOT W/NDL BIT 2.9 (DRILL) ×3
ELECT REM PT RETURN 9FT ADLT (ELECTROSURGICAL) ×3
ELECTRODE REM PT RTRN 9FT ADLT (ELECTROSURGICAL) ×1 IMPLANT
GAUZE PETRO XEROFOAM 1X8 (MISCELLANEOUS) ×3 IMPLANT
GAUZE SPONGE 4X4 12PLY STRL (GAUZE/BANDAGES/DRESSINGS) ×3 IMPLANT
GLOVE BIO SURGEON STRL SZ8 (GLOVE) ×6 IMPLANT
GLOVE INDICATOR 8.0 STRL GRN (GLOVE) ×3 IMPLANT
GOWN STRL REUS W/ TWL LRG LVL3 (GOWN DISPOSABLE) ×1 IMPLANT
GOWN STRL REUS W/ TWL XL LVL3 (GOWN DISPOSABLE) ×1 IMPLANT
GOWN STRL REUS W/TWL LRG LVL3 (GOWN DISPOSABLE) ×2
GOWN STRL REUS W/TWL XL LVL3 (GOWN DISPOSABLE) ×2
IV LACTATED RINGER IRRG 3000ML (IV SOLUTION) ×4
IV LR IRRIG 3000ML ARTHROMATIC (IV SOLUTION) ×2 IMPLANT
KIT TURNOVER KIT A (KITS) ×3 IMPLANT
MANIFOLD 4PT FOR NEPTUNE1 (MISCELLANEOUS) ×3 IMPLANT
MAT BLUE FLOOR 46X72 FLO (MISCELLANEOUS) ×3 IMPLANT
NDL HYPO 21X1.5 SAFETY (NEEDLE) ×2 IMPLANT
NEEDLE HYPO 21X1.5 SAFETY (NEEDLE) ×6 IMPLANT
PACK ARTHROSCOPY SHOULDER (MISCELLANEOUS) ×3 IMPLANT
SLING ULTRA II M (MISCELLANEOUS) ×2 IMPLANT
STAPLER SKIN PROX 35W (STAPLE) ×3 IMPLANT
STRAP BODY AND KNEE 60X3 (MISCELLANEOUS) ×6 IMPLANT
SUT ETHIBOND 0 MO6 C/R (SUTURE) ×2 IMPLANT
SUT VIC AB 2-0 CT1 27 (SUTURE) ×4
SUT VIC AB 2-0 CT1 TAPERPNT 27 (SUTURE) ×2 IMPLANT
TAPE MICROFOAM 4IN (TAPE) ×3 IMPLANT
TUBING ARTHRO INFLOW-ONLY STRL (TUBING) ×3 IMPLANT
WAND HAND CNTRL MULTIVAC 90 (MISCELLANEOUS) ×3 IMPLANT

## 2018-02-21 NOTE — Anesthesia Procedure Notes (Signed)
Anesthesia Regional Block: Interscalene brachial plexus block   Pre-Anesthetic Checklist: ,, timeout performed, Correct Patient, Correct Site, Correct Laterality, Correct Procedure, Correct Position, site marked, Risks and benefits discussed,  Surgical consent,  Pre-op evaluation,  At surgeon's request and post-op pain management  Laterality: Right  Prep: chloraprep       Needles:  Injection technique: Single-shot  Needle Type: Stimiplex     Needle Length: 10cm  Needle Gauge: 22     Additional Needles:   Procedures:,,,, ultrasound used (permanent image in chart),,,,  Narrative:  Start time: 02/21/2018 11:37 AM End time: 02/21/2018 11:44 AM Injection made incrementally with aspirations every 5 mL.  Performed by: Personally  Anesthesiologist: Jola BabinskiHarker, Christerpher Clos, MD

## 2018-02-21 NOTE — Anesthesia Preprocedure Evaluation (Addendum)
Anesthesia Evaluation  Patient identified by MRN, date of birth, ID band  Reviewed: Allergy & Precautions, NPO status , Patient's Chart, lab work & pertinent test results  Airway Mallampati: II  TM Distance: >3 FB     Dental   Pulmonary    breath sounds clear to auscultation       Cardiovascular hypertension,  Rhythm:Regular Rate:Normal  HLD   Neuro/Psych  Headaches,    GI/Hepatic GERD  ,  Endo/Other  diabetes, Type 2BMI 31   Renal/GU      Musculoskeletal   Abdominal   Peds  Hematology   Anesthesia Other Findings   Reproductive/Obstetrics                             Anesthesia Physical Anesthesia Plan  ASA: II  Anesthesia Plan: General and Regional   Post-op Pain Management:  Regional for Post-op pain   Induction: Intravenous  PONV Risk Score and Plan:   Airway Management Planned:   Additional Equipment:   Intra-op Plan:   Post-operative Plan:   Informed Consent: I have reviewed the patients History and Physical, chart, labs and discussed the procedure including the risks, benefits and alternatives for the proposed anesthesia with the patient or authorized representative who has indicated his/her understanding and acceptance.     Plan Discussed with: CRNA  Anesthesia Plan Comments:         Anesthesia Quick Evaluation

## 2018-02-21 NOTE — Anesthesia Procedure Notes (Signed)
Procedure Name: LMA Insertion Date/Time: 02/21/2018 12:36 PM Performed by: Jimmy PicketAmyot, Ladan Vanderzanden, CRNA Pre-anesthesia Checklist: Patient identified, Emergency Drugs available, Suction available, Timeout performed and Patient being monitored Patient Re-evaluated:Patient Re-evaluated prior to induction Oxygen Delivery Method: Circle system utilized Preoxygenation: Pre-oxygenation with 100% oxygen Induction Type: IV induction LMA: LMA inserted LMA Size: 4.0 Number of attempts: 1 Placement Confirmation: positive ETCO2 and breath sounds checked- equal and bilateral Tube secured with: Tape

## 2018-02-21 NOTE — H&P (Signed)
Paper H&P to be scanned into permanent record. H&P reviewed and patient re-examined. No changes. 

## 2018-02-21 NOTE — Transfer of Care (Signed)
Immediate Anesthesia Transfer of Care Note  Patient: Deborah Bartlett  Procedure(s) Performed: SHOULDER ARTHROSCOPY WITH DEBRIDEMENT DECOMPRESSION AND REPAIR OF ROTATOR CUFF TEAR (Right Shoulder)  Patient Location: PACU  Anesthesia Type: General, Regional  Level of Consciousness: awake, alert  and patient cooperative  Airway and Oxygen Therapy: Patient Spontanous Breathing and Patient connected to supplemental oxygen  Post-op Assessment: Post-op Vital signs reviewed, Patient's Cardiovascular Status Stable, Respiratory Function Stable, Patent Airway and No signs of Nausea or vomiting  Post-op Vital Signs: Reviewed and stable  Complications: No apparent anesthesia complications

## 2018-02-21 NOTE — Op Note (Signed)
02/21/2018  2:12 PM  Patient:   Deborah Bartlett  Pre-Op Diagnosis:   Impingement/tendinopathy with rotator cuff tear, right shoulder.  Post-Op Diagnosis: Impingement/tendinopathy with near full-thickness rotator cuff tear and biceps tendinopathy, right shoulder.  Procedure: Limited arthroscopic debridement, arthroscopic subacromial decompression, mini-open rotator cuff repair, and mini-open biceps tenodesis, right shoulder.  Anesthesia: General endotracheal with interscalene block placed preoperatively by the anesthesiologist.  Surgeon:   Maryagnes Amos, MD  Assistant:   None  Findings: As above. There was a near full-thickness bursal surface tear of the anterior and mid-supraspinatus insertional fibers. The remainder of the rotator cuff was in excellent condition. There was mild fraying of the superior portion of the labrum without frank detachment of the labrum from the glenoid. The biceps tendon demonstrated evidence of "lip sticking" and early fraying without any significant partial or full-thickness tears. The articular surfaces of the glenoid and humerus both were in excellent condition.  Complications: None  Fluids:   750 cc  Estimated blood loss: 10 cc  Tourniquet time: None  Drains: None  Closure: Staples   Brief clinical note: The patient is a 57 year old female with a history of right shoulder pain. The patient's symptoms have progressed despite medications, activity modification, etc. The patient's history and examination are consistent with impingement/tendinopathy with a rotator cuff tear. These findings were confirmed by MRI scan. The patient presents at this time for definitive management of these shoulder symptoms.  Procedure: The patient underwent placement of an interscalene block by the anesthesiologist in the preoperative holding area before being brought into the operating room and lain in the supine position. The patient then underwent  general endotracheal intubation and anesthesia before being repositioned in the beach chair position using the beach chair positioner. The right shoulder and upper extremity were prepped with ChloraPrep solution before being draped sterilely. Preoperative antibiotics were administered. A timeout was performed to confirm the proper surgical site before the expected portal sites and incision site were injected with 0.5% Sensorcaine with epinephrine. A posterior portal was created and the glenohumeral joint thoroughly inspected with the findings as described above. An anterior portal was created using an outside-in technique. The labrum and rotator cuff were further probed, again confirming the above-noted findings.  The area of labral fraying was debrided back to stable margins using the full-radius resector, as were areas of synovitis.  The ArthroCare wand was inserted and used to release the biceps tendon from its labral anchor.  It also was used to obtain hemostasis as well as to "anneal" the labrum superiorly and anteriorly. The instruments were removed from the joint after suctioning the excess fluid.  The camera was repositioned through the posterior portal into the subacromial space. A separate lateral portal was created using an outside-in technique. The 3.5 mm full-radius resector was introduced and used to perform a subtotal bursectomy. The ArthroCare wand was then inserted and used to remove the periosteal tissue off the undersurface of the anterior third of the acromion as well as to recess the coracoacromial ligament from its attachment along the anterior and lateral margins of the acromion. The 4.0 mm acromionizing bur was introduced and used to complete the decompression by removing the undersurface of the anterior third of the acromion. The full radius resector was reintroduced to remove any residual bony debris before the ArthroCare wand was reintroduced to obtain hemostasis. The instruments were  then removed from the subacromial space after suctioning the excess fluid.  An approximately 4-5 cm incision was  made over the anterolateral aspect of the shoulder beginning at the anterolateral corner of the acromion and extending distally in line with the bicipital groove. This incision was carried down through the subcutaneous tissues to expose the deltoid fascia. The raphae between the anterior and middle thirds was identified and this plane developed to provide access into the subacromial space. Additional bursal tissues were debrided sharply using Metzenbaum scissors. The rotator cuff tear was readily identified. The margins were debrided sharply with a #15 blade and the exposed greater tuberosity roughened with a rongeur. The tear was repaired using two Biomet 2.9 mm JuggerKnot anchors. These sutures were then brought back laterally and secured using two [place] QuatroLink anchors to create a two-layer closure. An apparent watertight closure was obtained.  The bicipital groove was identified by palpation and opened for 1-1.5 cm. The biceps tendon stump was retrieved through this defect. The floor of the bicipital groove was roughened with a curet before another Biomet 2.9 mm JuggerKnot anchor was inserted. Both sets of sutures were passed through the biceps tendon and tied securely to effect the tenodesis. The bicipital sheath was reapproximated using two #0 Ethibond interrupted sutures, incorporating the biceps tendon to further reinforce the tenodesis.  The wound was copiously irrigated with sterile saline solution before the deltoid raphae was reapproximated using 2-0 Vicryl interrupted sutures. The subcutaneous tissues were closed in two layers using 2-0 Vicryl interrupted sutures before the skin was closed using staples. The portal sites also were closed using staples. A sterile bulky dressing was applied to the shoulder before the arm was placed into a shoulder immobilizer. The patient was then  awakened, extubated, and returned to the recovery room in satisfactory condition after tolerating the procedure well.

## 2018-02-21 NOTE — Discharge Instructions (Addendum)
General Anesthesia, Adult, Care After These instructions provide you with information about caring for yourself after your procedure. Your health care provider may also give you more specific instructions. Your treatment has been planned according to current medical practices, but problems sometimes occur. Call your health care provider if you have any problems or questions after your procedure. What can I expect after the procedure? After the procedure, it is common to have:  Vomiting.  A sore throat.  Mental slowness.  It is common to feel:  Nauseous.  Cold or shivery.  Sleepy.  Tired.  Sore or achy, even in parts of your body where you did not have surgery.  Follow these instructions at home: For at least 24 hours after the procedure:  Do not: ? Participate in activities where you could fall or become injured. ? Drive. ? Use heavy machinery. ? Drink alcohol. ? Take sleeping pills or medicines that cause drowsiness. ? Make important decisions or sign legal documents. ? Take care of children on your own.  Rest. Eating and drinking  If you vomit, drink water, juice, or soup when you can drink without vomiting.  Drink enough fluid to keep your urine clear or pale yellow.  Make sure you have little or no nausea before eating solid foods.  Follow the diet recommended by your health care provider. General instructions  Have a responsible adult stay with you until you are awake and alert.  Return to your normal activities as told by your health care provider. Ask your health care provider what activities are safe for you.  Take over-the-counter and prescription medicines only as told by your health care provider.  If you smoke, do not smoke without supervision.  Keep all follow-up visits as told by your health care provider. This is important. Contact a health care provider if:  You continue to have nausea or vomiting at home, and medicines are not helpful.  You  cannot drink fluids or start eating again.  You cannot urinate after 8-12 hours.  You develop a skin rash.  You have fever.  You have increasing redness at the site of your procedure. Get help right away if:  You have difficulty breathing.  You have chest pain.  You have unexpected bleeding.  You feel that you are having a life-threatening or urgent problem. This information is not intended to replace advice given to you by your health care provider. Make sure you discuss any questions you have with your health care provider. Document Released: 11/14/2000 Document Revised: 01/11/2016 Document Reviewed: 07/23/2015 Elsevier Interactive Patient Education  2018 [employer]   Orthopedic discharge instructions: Keep dressing dry and intact.  May shower after dressing changed on post-op day #4 (Sunday).  Cover staples with Band-Aids after drying off. Apply ice frequently to shoulder. Take ibuprofen 600-800 mg TID OR Aleve 2 tabs BID with meals for 7-10 days, then as necessary. Take oxycodone as prescribed when needed.  May supplement with ES Tylenol if necessary. Keep shoulder immobilizer on at all times except may remove for bathing purposes. Follow-up in 10-14 days or as scheduled.

## 2018-02-21 NOTE — Anesthesia Postprocedure Evaluation (Signed)
Anesthesia Post Note  Patient: Deborah Bartlett  Procedure(s) Performed: SHOULDER ARTHROSCOPY WITH DEBRIDEMENT DECOMPRESSION AND REPAIR OF ROTATOR CUFF TEAR (Right Shoulder)  Patient location during evaluation: PACU Anesthesia Type: Regional Level of consciousness: awake Pain management: pain level controlled Vital Signs Assessment: post-procedure vital signs reviewed and stable Respiratory status: respiratory function stable Cardiovascular status: stable Postop Assessment: no signs of nausea or vomiting Anesthetic complications: no    Jola BabinskiElsje Swara Donze

## 2018-03-01 ENCOUNTER — Other Ambulatory Visit: Payer: Self-pay | Admitting: Internal Medicine

## 2018-03-23 ENCOUNTER — Other Ambulatory Visit: Payer: Self-pay | Admitting: Internal Medicine

## 2018-03-23 MED ORDER — GLUCOSE BLOOD VI STRP: ORAL_STRIP | 12 refills | Status: DC

## 2018-03-23 NOTE — Telephone Encounter (Signed)
Copied from CRM [id number]. Topic: Quick Communication - Rx Refill/Question >> Mar 23, 2018 10:48 AM Oneal GroutSebastian, Jennifer S wrote: Medication: glucose blood test strip for one touch verio flex  Has the patient contacted their pharmacy? Yes.   (Agent: If no, request that the patient contact the pharmacy for the refill.) (Agent: If yes, when and what did the pharmacy advise?)  [facility]erred Pharm[facility] (with phone number [facility] street [facility] [place]  Agent: Please be advised that RX refills may take up to 3 business days. We ask that you follow-up with your pharmacy.

## 2018-03-23 NOTE — Telephone Encounter (Signed)
LOV  12/05/17 Dr. Lorin PicketScott Last refill 02/17/16 # 100 [facility] [place]

## 2018-04-06 ENCOUNTER — Other Ambulatory Visit: Payer: Self-pay | Admitting: Internal Medicine

## 2018-04-11 ENCOUNTER — Other Ambulatory Visit: Payer: Self-pay | Admitting: Internal Medicine

## 2018-04-23 IMAGING — US US EXTREM LOW VENOUS*L*
1 series · 14 of 24 positions shown · non-contrast
Comparison: None.

CLINICAL DATA: Left leg swelling and warmth today

EXAM:
LEFT LOWER EXTREMITY VENOUS DUPLEX ULTRASOUND
TECHNIQUE: Doppler venous assessment of the left lower extremity deep venous
system was performed, including characterization of spectral flow,
compressibility, and phasicity.

[Series 1: us extrem low venous*left* · 0.07mm/px · 14 of 43 slices shown]
[im 1/43]
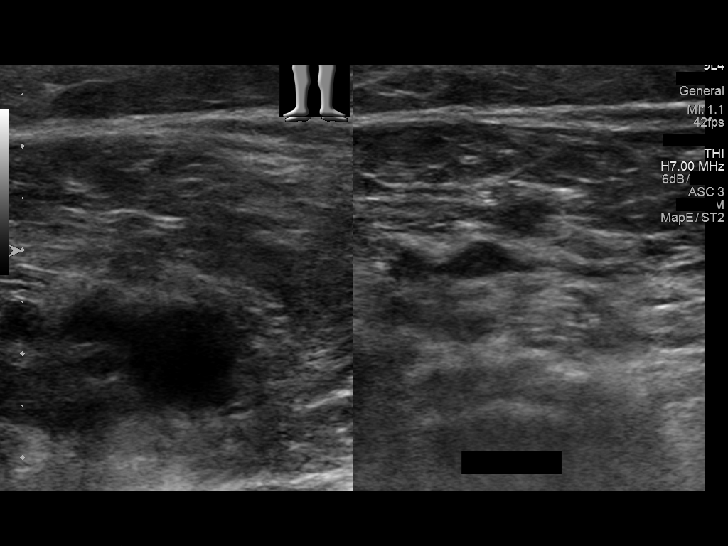
[im 4/43]
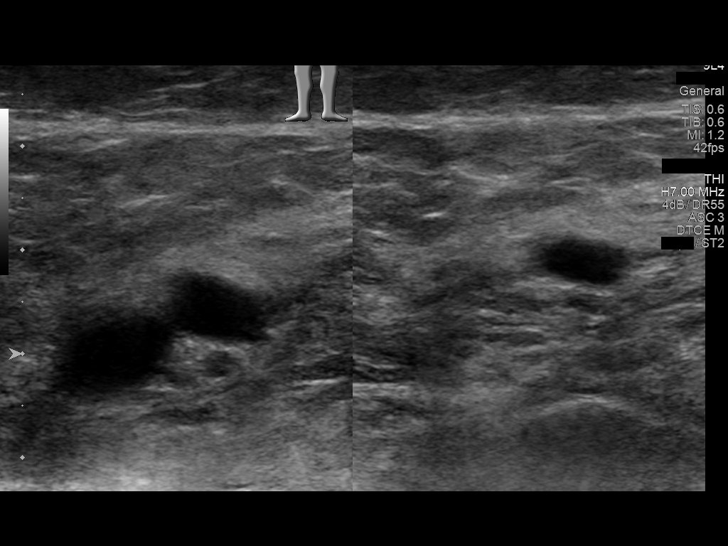
[im 8/43]
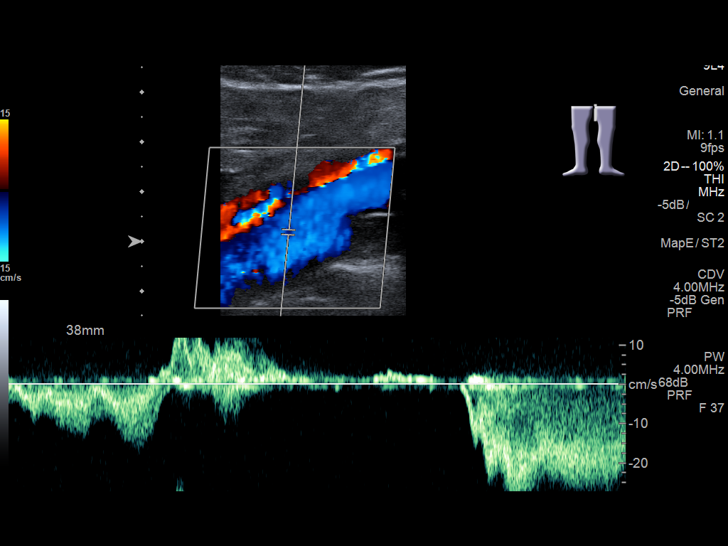
[im 11/43]
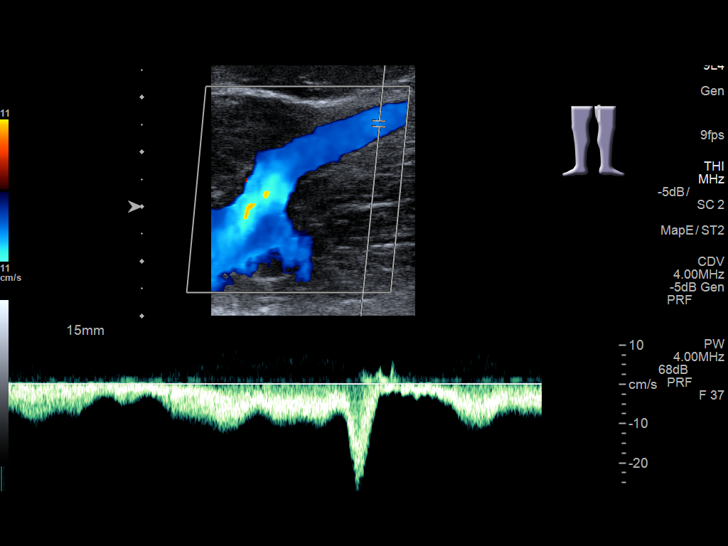
[im 13/43]
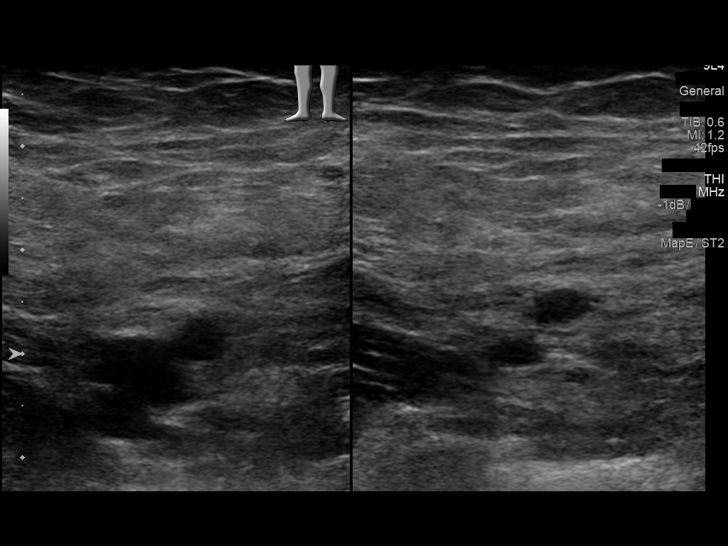
[im 17/43]
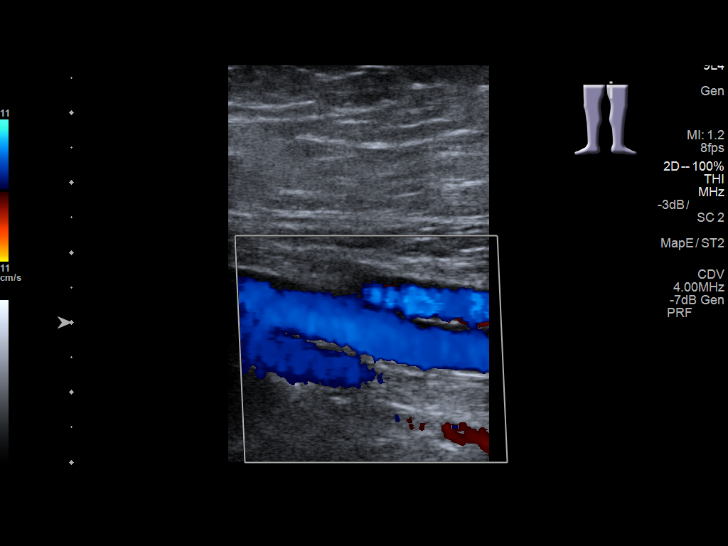
[im 21/43]
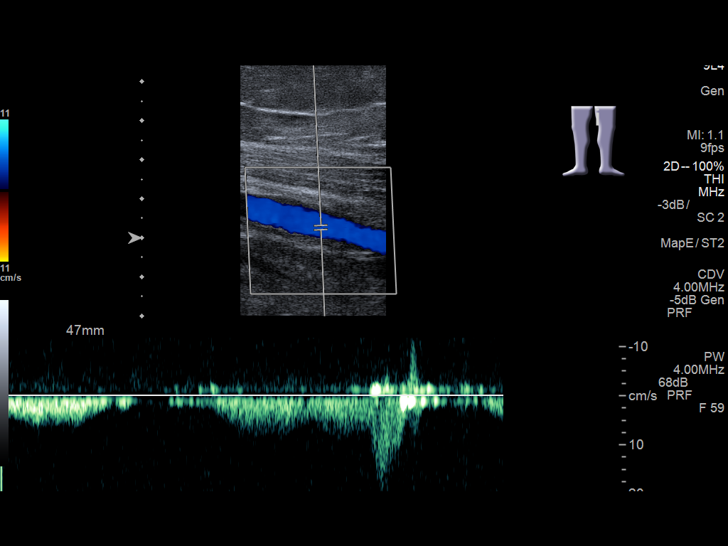
[im 22/43]
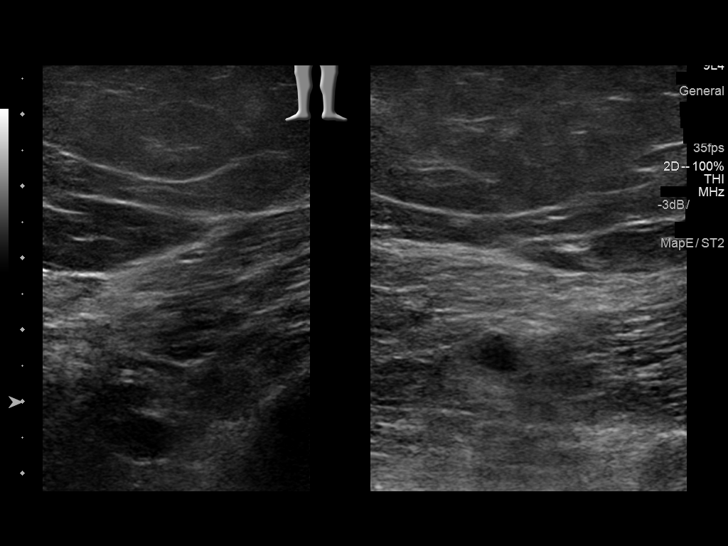
[im 26/43]
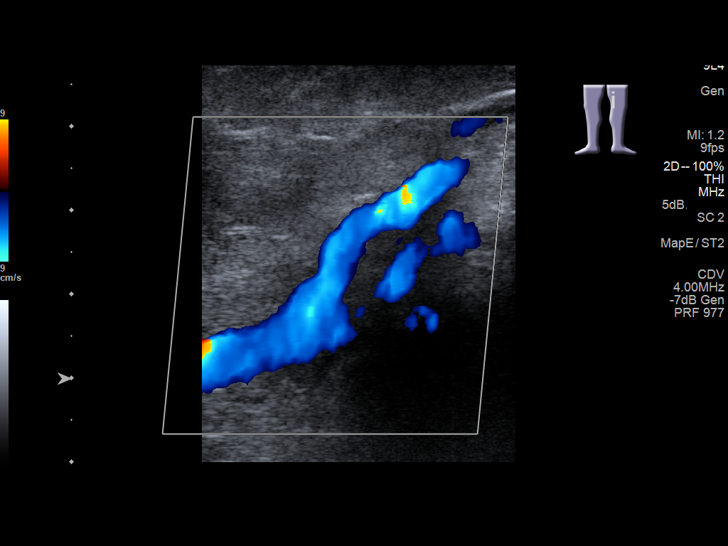
[im 30/43]
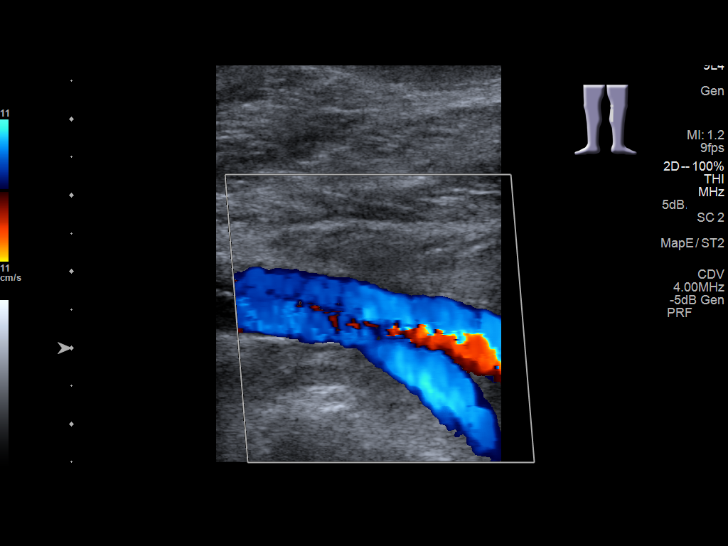
[im 33/43]
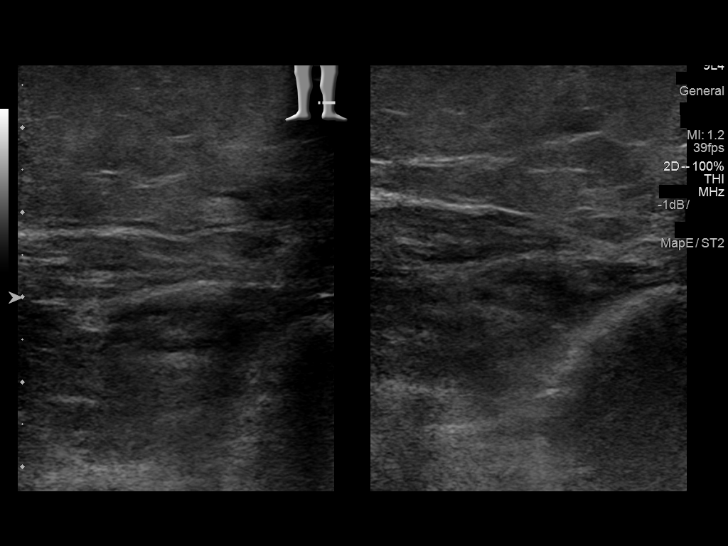
[im 35/43]
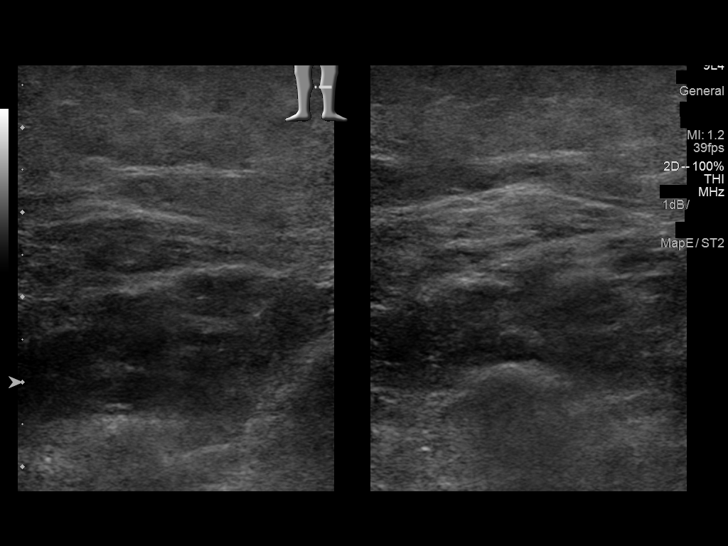
[im 39/43]
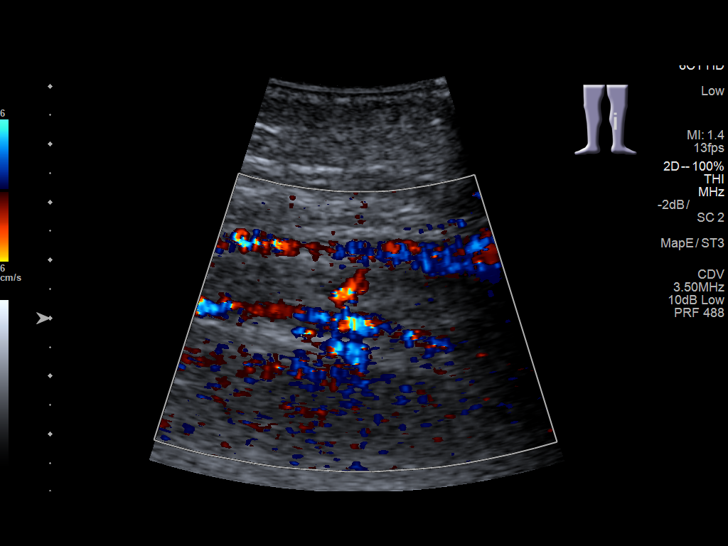
[im 43/43]
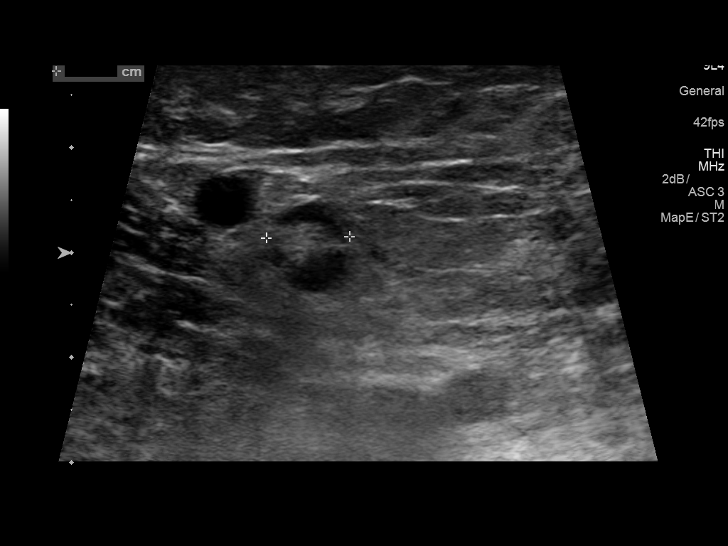

[14 of 24 positions shown; findings below may reference images not displayed]

FINDINGS: There is complete compressibility of the left common femoral,
femoral, and popliteal veins. Doppler analysis demonstrates
respiratory phasicity and augmentation of flow with calf
compression. No obvious superficial vein or calf vein thrombosis.
IMPRESSION: No evidence of left lower extremity DVT.

## 2018-05-24 ENCOUNTER — Telehealth: Payer: Self-pay | Admitting: Internal Medicine

## 2018-06-05 ENCOUNTER — Other Ambulatory Visit: Payer: Self-pay | Admitting: Internal Medicine

## 2018-06-06 NOTE — Telephone Encounter (Signed)
Pt states she was informed by her pharmacy that this medication is on back order and they do not know when they will be able to fill it for her.  She would like to know what Dr Lorin Picket recommends she do.     [phone]

## 2018-06-06 NOTE — Telephone Encounter (Signed)
Do you want to change this medication or try another pharmacy?

## 2018-06-06 NOTE — Telephone Encounter (Signed)
[facility] none of their pharmacies have the combination medication but they can fill the medications separately for a couple months . He said that is what they normally do.

## 2018-06-06 NOTE — Telephone Encounter (Signed)
I assume she is talking about the losartan /hctz.  If so, let her know other pharmacies may have the medication.  If she wants to get the medication from another pharmacy - ok.  If not, then I can change the medication to a different medication. Also, she needs a f/u with me - overdue.

## 2018-06-06 NOTE — Telephone Encounter (Signed)
Ok to fill the medication individually if pt ok.

## 2018-06-07 NOTE — Telephone Encounter (Signed)
I have called medication in separately and patient is aware

## 2018-07-14 ENCOUNTER — Other Ambulatory Visit: Payer: Self-pay | Admitting: Internal Medicine

## 2018-08-13 ENCOUNTER — Other Ambulatory Visit: Payer: Self-pay | Admitting: Internal Medicine

## 2018-08-28 ENCOUNTER — Encounter: Payer: Self-pay | Admitting: Internal Medicine

## 2018-08-29 NOTE — Telephone Encounter (Signed)
Advised patient to go to urgent care for evaluation. Patient is not having any sinus sx. NO congestion, cough, etc. Just facial pain and swelling in face on the left side. Advised patient that it is unable to determine if it is sinus related with out evaluation and she should go today. Patient agreed

## 2018-09-03 ENCOUNTER — Other Ambulatory Visit: Payer: Self-pay | Admitting: Internal Medicine

## 2018-09-04 NOTE — Telephone Encounter (Signed)
She needs to schedule a f/u appt before getting refill.

## 2018-09-08 ENCOUNTER — Other Ambulatory Visit: Payer: Self-pay | Admitting: Internal Medicine

## 2018-09-11 NOTE — Telephone Encounter (Signed)
Left message for patient to call back and schedule appt

## 2018-09-11 NOTE — Telephone Encounter (Signed)
Patient called in to schedule appt next avail is 12/24/2018

## 2018-09-14 NOTE — Telephone Encounter (Signed)
Pt scheduled for 2/10

## 2018-10-01 ENCOUNTER — Encounter: Payer: Self-pay | Admitting: Internal Medicine

## 2018-10-01 ENCOUNTER — Other Ambulatory Visit (HOSPITAL_COMMUNITY)
Admission: RE | Admit: 2018-10-01 | Discharge: 2018-10-01 | Disposition: A | Discharge status: Home / Self Care | Payer: BC Managed Care – PPO | Source: Ambulatory Visit | Attending: Internal Medicine | Admitting: Internal Medicine

## 2018-10-01 ENCOUNTER — Ambulatory Visit (INDEPENDENT_AMBULATORY_CARE_PROVIDER_SITE_OTHER): Payer: BC Managed Care – PPO | Admitting: Internal Medicine

## 2018-10-01 VITALS — BP 134/78 | HR 103 | Temp 97.9°F | Resp 16 | Wt 155.0 lb

## 2018-10-01 DIAGNOSIS — Z0000 Encounter for general adult medical examination without abnormal findings: Secondary | ICD-10-CM

## 2018-10-01 DIAGNOSIS — I10 Essential (primary) hypertension: Secondary | ICD-10-CM

## 2018-10-01 DIAGNOSIS — Z1239 Encounter for other screening for malignant neoplasm of breast: Secondary | ICD-10-CM | POA: Diagnosis not present

## 2018-10-01 DIAGNOSIS — K219 Gastro-esophageal reflux disease without esophagitis: Secondary | ICD-10-CM

## 2018-10-01 DIAGNOSIS — E7800 Pure hypercholesterolemia, unspecified: Secondary | ICD-10-CM

## 2018-10-01 DIAGNOSIS — Z124 Encounter for screening for malignant neoplasm of cervix: Secondary | ICD-10-CM

## 2018-10-01 DIAGNOSIS — E119 Type 2 diabetes mellitus without complications: Secondary | ICD-10-CM

## 2018-10-01 NOTE — Assessment & Plan Note (Signed)
Controlled.  

## 2018-10-01 NOTE — Assessment & Plan Note (Addendum)
Physical today 10/01/18.  PAP 10/01/18.  Mammogram - schedule.  Colonoscopy 05/2013.  Normal.  Recommended f/u colonoscopy in 10 years.

## 2018-10-01 NOTE — Assessment & Plan Note (Signed)
Blood pressure as outlined.  Continue same medication regimen.  Follow pressures.  Follow metabolic panel.  

## 2018-10-01 NOTE — Assessment & Plan Note (Signed)
Low cholesterol diet and exercise.  Follow lipid panel.   

## 2018-10-01 NOTE — Assessment & Plan Note (Signed)
Low carb diet and exercise.  Follow met b and a1c.

## 2018-10-01 NOTE — Progress Notes (Signed)
Patient ID: Deborah Bartlett, female   DOB: 02/09/1961, 58 y.o.   MRN: [id number]   Subjective:    Patient ID: Deborah Bartlett, female    DOB: [birth year], 58 y.o.   MRN: [id number]  HPI  Patient here for a scheduled follow up.  Was overdue a physical.  Visit changed to a physical exam.  S/p shoulder surgery 08/10/18.  Followed by ortho.  Last evaluated 08/10/18.  Recommended that she continue with her home exercise program.  She reports she is doing relatively well.  Continues f/u with ortho.  Increased stress with her job.  Discussed with her today.  Overall she feels she is handling things relatively well.  Trying to stay active.  No chest pain.  No sob.  No acid reflux.  No abdominal pain.  Bowels moving.   No current redness or infection - lower extremities.  Left knee pain - superficial. No redness or swelling.  Is better.     Past Medical History:  Diagnosis Date  . Diabetes mellitus without complication ([place])   . GERD (gastroesophageal reflux disease)   . Hypercholesteremia   . Hypertension   . Migraine    irregular, rare  . Perimenopausal 2014   Past Surgical History:  Procedure Laterality Date  . 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 [place]  . COLONOSCOPY  2014   Dr Bary Castilla  . SHOULDER ARTHROSCOPY WITH ROTATOR CUFF REPAIR Right 02/21/2018   Procedure: SHOULDER ARTHROSCOPY WITH DEBRIDEMENT DECOMPRESSION AND REPAIR OF ROTATOR CUFF TEAR;  Surgeon: Corky Mull, MD;  Location: [facility];  Service: Orthopedics;  Laterality: Right;  Diabetic - diet controlled   Family History  Problem Relation Age of Onset  . Hypertension Mother   . Diabetes Mother   . Diabetes Maternal Grandmother   . Diabetes Maternal Grandfather   . Breast cancer Neg Hx   . Colon cancer Neg Hx    Social History   Socioeconomic History  . Marital status: Married    Spouse name: Not on file  . Number of children: Not on file  . Years of  education: Not on file  . Highest education level: Not on file  Occupational History  . Not on file  Social Needs  . Financial resource strain: Not on file  . Food insecurity:    Worry: Not on file    Inability: Not on file  . Transportation needs:    Medical: Not on file    Non-medical: Not on file  Tobacco Use  . Smoking status: Never Smoker  . Smokeless tobacco: Never Used  Substance and Sexual Activity  . Alcohol use: No    Alcohol/week: 0.0 standard drinks  . Drug use: No  . Sexual activity: Not on file  Lifestyle  . Physical activity:    Days per week: Not on file    Minutes per session: Not on file  . Stress: Not on file  Relationships  . Social connections:    Talks on phone: Not on file    Gets together: Not on file    Attends religious service: Not on file    Active member of club or organization: Not on file    Attends meetings of clubs or organizations: Not on file    Relationship status: Not on file  Other Topics Concern  . Not on file  Social History Narrative  . Not on file    Outpatient  Encounter Medications as of 10/01/2018  Medication Sig  . amLODipine (NORVASC) 5 MG tablet TAKE 1 TABLET BY MOUTH EVERY DAY  . aspirin 81 MG tablet Take 81 mg by mouth daily.  Marland Kitchen atorvastatin (LIPITOR) 20 MG tablet TAKE 1 TABLET BY MOUTH EVERY DAY  . glucose blood test strip Use as instructed to check blood sugars 3-4 times a week, twice a day.  E11.9 Dispense One Touch verio test strips  . hydrochlorothiazide (HYDRODIURIL) 25 MG tablet TAKE 1 TABLET BY MOUTH EVERY DAY  . Lancets (ONETOUCH ULTRASOFT) lancets Use as instructed to check Blood sugars 3-4 times a week, twice a day.  Dispense One Touch brand. E11.9  . losartan (COZAAR) 100 MG tablet TAKE 1 TABLET BY MOUTH EVERY DAY  . losartan-hydrochlorothiazide (HYZAAR) 100-25 MG tablet TAKE 1 TABLET BY MOUTH EVERY DAY  . Multiple Vitamin (MULTIVITAMIN) capsule Take 1 capsule by mouth daily.  . naproxen sodium (ALEVE) 220  MG tablet Take 220 mg by mouth 2 (two) times daily as needed.  Marland Kitchen omeprazole (PRILOSEC) 20 MG capsule Take 20 mg by mouth daily.  Marland Kitchen oxycodone (OXY-IR) 5 MG capsule Take 1-2 capsules (5-10 mg total) by mouth every 4 (four) hours as needed.  . VESICARE 5 MG tablet TAKE 1 TABLET BY MOUTH EVERY DAY  . [DISCONTINUED] Magnesium 400 MG TABS Take 1 tablet by mouth daily.    No facility-administered encounter medications on file as of 10/01/2018.     Review of Systems  Constitutional: Negative for appetite change and unexpected weight change.  HENT: Negative for congestion and sinus pressure.   Eyes: Negative for pain and visual disturbance.  Respiratory: Negative for cough, chest tightness and shortness of breath.   Cardiovascular: Negative for chest pain, palpitations and leg swelling.  Gastrointestinal: Negative for abdominal pain, diarrhea, nausea and vomiting.  Genitourinary: Negative for difficulty urinating and dysuria.  Musculoskeletal: Negative for joint swelling and myalgias.  Skin: Negative for color change and rash.  Neurological: Negative for dizziness, light-headedness and headaches.  Hematological: Negative for adenopathy. Does not bruise/bleed easily.  Psychiatric/Behavioral: Negative for agitation and dysphoric mood.       Objective:    Physical Exam Constitutional:      General: She is not in acute distress.    Appearance: Normal appearance. She is well-developed.  HENT:     Nose: Nose normal. No congestion.     Mouth/Throat:     Pharynx: No oropharyngeal exudate or posterior oropharyngeal erythema.  Eyes:     General: No scleral icterus.       Right eye: No discharge.        Left eye: No discharge.  Neck:     Musculoskeletal: Neck supple. No muscular tenderness.     Thyroid: No thyromegaly.  Cardiovascular:     Rate and Rhythm: Normal rate and regular rhythm.  Pulmonary:     Effort: No tachypnea, accessory muscle usage or respiratory distress.     Breath sounds:  Normal breath sounds. No decreased breath sounds or wheezing.  Chest:     Breasts:        Right: No inverted nipple, mass, nipple discharge or tenderness (no axillary adenopathy).        Left: No inverted nipple, mass, nipple discharge or tenderness (no axilarry adenopathy).  Abdominal:     General: Bowel sounds are normal.     Palpations: Abdomen is soft.     Tenderness: There is no abdominal tenderness.  Genitourinary:    Comments:  Normal external genitalia.  Vaginal vault without lesions.  Cervix identified.  Pap smear performed.  Could not appreciate any adnexal masses or tenderness.   Musculoskeletal:        General: No swelling or tenderness.  Lymphadenopathy:     Cervical: No cervical adenopathy.  Skin:    General: Skin is warm.     Findings: No erythema or rash.  Neurological:     Mental Status: She is alert and oriented to person, place, and time.  Psychiatric:        Mood and Affect: Mood normal.        Behavior: Behavior normal.     BP 134/78   Pulse (!) 103   Temp 97.9 F (36.6 C) (Oral)   Resp 16   Wt 155 lb (70.3 kg)   LMP 02/13/2013   SpO2 99%   BMI 30.27 kg/m  Wt Readings from Last 3 Encounters:  10/01/18 155 lb (70.3 kg)  02/21/18 153 lb (69.4 kg)  12/05/17 156 lb 12.8 oz (71.1 kg)     Lab Results  Component Value Date   WBC 7.0 12/05/2017   HGB 12.8 12/05/2017   HCT 38.2 12/05/2017   PLT 302.0 12/05/2017   GLUCOSE 92 12/05/2017   CHOL 219 (H) 05/08/2017   TRIG 324.0 (H) 05/08/2017   HDL 52.70 05/08/2017   LDLDIRECT 114.0 05/08/2017   LDLCALC 113 (H) 07/13/2016   ALT 24 12/05/2017   AST 19 12/05/2017   NA 138 12/05/2017   K 3.9 12/05/2017   CL 102 12/05/2017   CREATININE 0.79 12/05/2017   BUN 22 12/05/2017   CO2 29 12/05/2017   TSH 3.14 05/08/2017   HGBA1C 6.6 (H) 12/05/2017   MICROALBUR 1.6 05/08/2017       Assessment & Plan:   Problem List Items Addressed This Visit    Diabetes mellitus ([place])    Low carb diet and exercise.   Follow met b and a1c.        Relevant Orders   Hemoglobin F7J   Basic metabolic panel   Essential hypertension, benign    Blood pressure as outlined.  Continue same medication regimen.  Follow pressures.  Follow metabolic panel.        GERD (gastroesophageal reflux disease)    Controlled.        Health care maintenance    Physical today 10/01/18.  PAP 10/01/18.  Mammogram - schedule.  Colonoscopy 05/2013.  Normal.  Recommended f/u colonoscopy in 10 years.        Hypercholesterolemia    Low cholesterol diet and exercise.  Follow lipid panel.        Relevant Orders   TSH   Hepatic function panel    Other Visit Diagnoses    Breast cancer screening    -  Primary   Relevant Orders   MM 3D SCREEN BREAST BILATERAL   Cervical cancer screening       Relevant Orders   Cytology - PAP( )       Einar Pheasant, MD

## 2018-10-02 LAB — BASIC METABOLIC PANEL
BUN: 16 mg/dL (ref 6–23)
CO2: 27 mEq/L (ref 19–32)
Calcium: 9.9 mg/dL (ref 8.4–10.5)
Chloride: 103 mEq/L (ref 96–112)
Creatinine, Ser: 1.05 mg/dL (ref 0.40–1.20)
GFR: 54.14 mL/min — ABNORMAL LOW (ref >60.00)
Glucose, Bld: 93 mg/dL (ref 70–99)
Potassium: 4.1 mEq/L (ref 3.5–5.1)
Sodium: 140 mEq/L (ref 135–145)

## 2018-10-02 LAB — HEPATIC FUNCTION PANEL
ALT: 21 U/L (ref 0–35)
AST: 23 U/L (ref 0–37)
Albumin: 4.2 g/dL (ref 3.5–5.2)
Alkaline Phosphatase: 128 U/L — ABNORMAL HIGH (ref 39–117)
Bilirubin, Direct: 0 mg/dL (ref 0.0–0.3)
Total Bilirubin: 0.3 mg/dL (ref 0.2–1.2)
Total Protein: 7.4 g/dL (ref 6.0–8.3)

## 2018-10-02 LAB — HEMOGLOBIN A1C: Hgb A1c MFr Bld: 6.5 % (ref 4.6–6.5)

## 2018-10-02 LAB — TSH: TSH: 2.04 u[IU]/mL (ref 0.35–4.50)

## 2018-10-03 ENCOUNTER — Other Ambulatory Visit: Payer: Self-pay | Admitting: Internal Medicine

## 2018-10-03 DIAGNOSIS — R944 Abnormal results of kidney function studies: Secondary | ICD-10-CM

## 2018-10-03 DIAGNOSIS — E7800 Pure hypercholesterolemia, unspecified: Secondary | ICD-10-CM

## 2018-10-03 DIAGNOSIS — R748 Abnormal levels of other serum enzymes: Secondary | ICD-10-CM

## 2018-10-03 NOTE — Progress Notes (Signed)
Order placed for f/u labs.  

## 2018-10-04 ENCOUNTER — Encounter: Payer: Self-pay | Admitting: Internal Medicine

## 2018-10-04 LAB — CYTOLOGY - PAP
Diagnosis: NEGATIVE
HPV: NOT DETECTED

## 2018-10-13 ENCOUNTER — Other Ambulatory Visit: Payer: Self-pay | Admitting: Internal Medicine

## 2018-10-16 ENCOUNTER — Other Ambulatory Visit (INDEPENDENT_AMBULATORY_CARE_PROVIDER_SITE_OTHER): Payer: BC Managed Care – PPO

## 2018-10-16 DIAGNOSIS — E7800 Pure hypercholesterolemia, unspecified: Secondary | ICD-10-CM | POA: Diagnosis not present

## 2018-10-16 DIAGNOSIS — R944 Abnormal results of kidney function studies: Secondary | ICD-10-CM | POA: Diagnosis not present

## 2018-10-16 DIAGNOSIS — R748 Abnormal levels of other serum enzymes: Secondary | ICD-10-CM | POA: Diagnosis not present

## 2018-10-16 LAB — BASIC METABOLIC PANEL
BUN: 19 mg/dL (ref 6–23)
CO2: 29 mEq/L (ref 19–32)
Calcium: 10.6 mg/dL — ABNORMAL HIGH (ref 8.4–10.5)
Chloride: 101 mEq/L (ref 96–112)
Creatinine, Ser: 1 mg/dL (ref 0.40–1.20)
GFR: 57.26 mL/min — ABNORMAL LOW (ref >60.00)
Glucose, Bld: 103 mg/dL — ABNORMAL HIGH (ref 70–99)
Potassium: 4.1 mEq/L (ref 3.5–5.1)
SODIUM: 138 meq/L (ref 135–145)

## 2018-10-16 LAB — LDL CHOLESTEROL, DIRECT: Direct LDL: 134 mg/dL

## 2018-10-16 LAB — LIPID PANEL
Cholesterol: 220 mg/dL — ABNORMAL HIGH (ref 0–200)
HDL: 46.6 mg/dL (ref >39.00)
NonHDL: 173.21
Total CHOL/HDL Ratio: 5
Triglycerides: 257 mg/dL — ABNORMAL HIGH (ref 0.0–149.0)
VLDL: 51.4 mg/dL — AB (ref 0.0–40.0)

## 2018-10-16 LAB — HEPATIC FUNCTION PANEL
ALT: 35 U/L (ref 0–35)
AST: 29 U/L (ref 0–37)
Albumin: 4.6 g/dL (ref 3.5–5.2)
Alkaline Phosphatase: 155 U/L — ABNORMAL HIGH (ref 39–117)
Bilirubin, Direct: 0.1 mg/dL (ref 0.0–0.3)
TOTAL PROTEIN: 8 g/dL (ref 6.0–8.3)
Total Bilirubin: 0.5 mg/dL (ref 0.2–1.2)

## 2018-10-17 ENCOUNTER — Other Ambulatory Visit: Payer: Self-pay | Admitting: Internal Medicine

## 2018-10-17 ENCOUNTER — Other Ambulatory Visit: Payer: Self-pay

## 2018-10-17 ENCOUNTER — Encounter: Payer: Self-pay | Admitting: Internal Medicine

## 2018-10-17 DIAGNOSIS — R748 Abnormal levels of other serum enzymes: Secondary | ICD-10-CM

## 2018-10-17 MED ORDER — ATORVASTATIN CALCIUM 40 MG PO TABS: 40.0000 mg | ORAL_TABLET | Freq: Every day | ORAL | 1 refills | Status: DC

## 2018-10-17 MED ORDER — SOLIFENACIN SUCCINATE 5 MG PO TABS: 5.0000 mg | ORAL_TABLET | Freq: Every day | ORAL | 0 refills | Status: DC

## 2018-10-17 NOTE — Progress Notes (Signed)
rx ok'd for vesicare #90 with no refills.

## 2018-10-17 NOTE — Progress Notes (Signed)
Order placed for f/u labs.  

## 2018-10-18 ENCOUNTER — Ambulatory Visit
Admission: RE | Admit: 2018-10-18 | Discharge: 2018-10-18 | Disposition: A | Discharge status: Home / Self Care | Payer: BC Managed Care – PPO | Source: Ambulatory Visit | Attending: Internal Medicine | Admitting: Internal Medicine

## 2018-10-18 DIAGNOSIS — Z1231 Encounter for screening mammogram for malignant neoplasm of breast: Secondary | ICD-10-CM | POA: Diagnosis present

## 2018-10-18 DIAGNOSIS — Z1239 Encounter for other screening for malignant neoplasm of breast: Secondary | ICD-10-CM

## 2018-10-18 NOTE — Telephone Encounter (Signed)
Patient confirmed no other symptoms. She says that the nasacort does help. She feels the worst in the mornings. Once shes been up she feels okay. The drainage is causing her to cough and makes her throat sore. She is wondering if there is anything OTC she can take or if she needs to come in for an appt. She is using nasacort, nyquil cough, and cetirizine.

## 2018-10-18 NOTE — Telephone Encounter (Signed)
Pt aware and scheduled.

## 2018-10-18 NOTE — Telephone Encounter (Signed)
If wheezing and worsening symptoms despite these medications, sounds like she needs to be seen to determine best treatment (for ex, prednisone, etc).

## 2018-10-19 ENCOUNTER — Encounter: Payer: Self-pay | Admitting: Family Medicine

## 2018-10-19 ENCOUNTER — Ambulatory Visit: Payer: BC Managed Care – PPO | Admitting: Family Medicine

## 2018-10-19 VITALS — BP 132/90 | HR 102 | Temp 98.1°F | Resp 20 | Ht 60.0 in | Wt 154.4 lb

## 2018-10-19 DIAGNOSIS — J988 Other specified respiratory disorders: Secondary | ICD-10-CM

## 2018-10-19 DIAGNOSIS — R05 Cough: Secondary | ICD-10-CM | POA: Diagnosis not present

## 2018-10-19 DIAGNOSIS — R0989 Other specified symptoms and signs involving the circulatory and respiratory systems: Secondary | ICD-10-CM | POA: Diagnosis not present

## 2018-10-19 DIAGNOSIS — R059 Cough, unspecified: Secondary | ICD-10-CM

## 2018-10-19 MED ORDER — METHYLPREDNISOLONE ACETATE 40 MG/ML IJ SUSP
40.0000 mg | Freq: Once | INTRAMUSCULAR | Status: AC
  Administered 2018-10-19: 40 mg via INTRAMUSCULAR

## 2018-10-19 MED ORDER — CEFTRIAXONE SODIUM 1 G IJ SOLR
1.0000 g | Freq: Once | INTRAMUSCULAR | Status: AC
  Administered 2018-10-19: 1 g via INTRAMUSCULAR

## 2018-10-19 NOTE — Patient Instructions (Signed)
Use mucinex for cough -- plain mucinex (just guaifenesin for cough suppression) is in blue and white box

## 2018-10-19 NOTE — Progress Notes (Signed)
Subjective:    Patient ID: Deborah Bartlett, female    DOB: 08/24/1960, 58 y.o.   MRN: [id number]  HPI   Patient presents to clinic complaining of cough, congestion, sneezing, yellow mucus that began about 4 to 5 days ago.  Patient has been taking Nasacort, Zyrtec and over-the-counter cough syrup from NyQuil to help calm symptoms.  Nasacort and Zyrtec do seem to help reduce nasal congestion, but cough has become deeper in her chest.  Patient is concerned about the cough and is traveling this weekend so would like something to help her feel better.  Denies fever or chills.  Denies wheezing.  Denies chest pain.  Denies nausea/vomiting or diarrhea.  Denies body aches.  Patient Active Problem List   Diagnosis Date Noted  . Thigh numbness 04/30/2017  . Overactive bladder 04/30/2017  . Cellulitis of suprapubic region 09/23/2016  . Abscess 09/22/2016  . Urinary frequency 07/18/2016  . Diabetes mellitus (HCC) 07/13/2016  . Environmental allergies 01/18/2016  . Encounter for completion of form with patient 01/18/2016  . Health care maintenance 04/29/2015  . Left leg swelling 12/07/2014  . Cellulitis 12/07/2014  . Left shoulder pain 06/08/2014  . Back pain 03/17/2014  . Extremity numbness 01/25/2014  . Headache 06/09/2013  . GERD (gastroesophageal reflux disease) 06/09/2013  . Endometriosis 06/09/2013  . Essential hypertension, benign 06/09/2013  . Hypercholesterolemia 06/09/2013  . Change in vision 03/20/2013   Social History   Tobacco Use  . Smoking status: Never Smoker  . Smokeless tobacco: Never Used  Substance Use Topics  . Alcohol use: No    Alcohol/week: 0.0 standard drinks   Review of Systems  Constitutional: Negative for chills, fatigue and fever.  HENT: +nasal congestion. Negative for ear pain, sinus pain and sore throat.   Eyes: Negative.   Respiratory: +cough and chest congestion. Negative for shortness of breath and wheezing.   Cardiovascular: Negative for chest  pain, palpitations and leg swelling.  Gastrointestinal: Negative for abdominal pain, diarrhea, nausea and vomiting.  Genitourinary: Negative for dysuria, frequency and urgency.  Musculoskeletal: Negative for arthralgias and myalgias.  Skin: Negative for color change, pallor and rash.  Neurological: Negative for syncope, light-headedness and headaches.  Psychiatric/Behavioral: The patient is not nervous/anxious.       Objective:   Physical Exam Vitals signs and nursing note reviewed.  Constitutional:      General: She is not in acute distress.    Appearance: She is not toxic-appearing.  HENT:     Head: Normocephalic.     Right Ear: Tympanic membrane, ear canal and external ear normal.     Left Ear: Tympanic membrane, ear canal and external ear normal.     Nose: Rhinorrhea (+post nasal drip) present.     Mouth/Throat:     Mouth: Mucous membranes are moist.     Pharynx: No oropharyngeal exudate or posterior oropharyngeal erythema.  Eyes:     General: No scleral icterus.    Extraocular Movements: Extraocular movements intact.     Conjunctiva/sclera: Conjunctivae normal.  Neck:     Musculoskeletal: Neck supple. No neck rigidity.  Cardiovascular:     Rate and Rhythm: Normal rate and regular rhythm.  Pulmonary:     Effort: Pulmonary effort is normal. No respiratory distress.     Breath sounds: Rhonchi (scattered in upper lobes) present. No rales.  Lymphadenopathy:     Cervical: No cervical adenopathy.  Skin:    General: Skin is warm and dry.     Coloration:  Skin is not jaundiced or pale.  Neurological:     Mental Status: She is alert and oriented to person, place, and time.  Psychiatric:        Mood and Affect: Mood normal.        Behavior: Behavior normal.    Today's Vitals   10/19/18 0855  BP: 132/90  Pulse: (!) 120  Resp: 20  Temp: 98.1 F (36.7 C)  TempSrc: Oral  SpO2: 97%  Weight: 154 lb 6.4 oz (70 kg)  Height: 5' (1.524 m)   Body mass index is 30.15 kg/m.      Assessment & Plan:   Respiratory infection, chest congestion, cough - patient advised to continue using her Nasacort and Zyrtec as she has been doing to help reduce allergy symptoms.  Due to discoloration of phlegm, chest congestion and lung sounds we will treat with IM Rocephin x1 in clinic and IM methylprednisolone x1.  Advised to use Mucinex for cough, Mucinex is a great over-the-counter cough medication to help reduce cough and thin secretions.  Offered albuterol inhaler prescription, however patient declines.  Administrations This Visit    cefTRIAXone (ROCEPHIN) injection 1 g    Admin Date 10/19/2018 Action Given Dose 1 g Route Intramuscular Administered By Clearnce Sorrel, RMA       methylPREDNISolone acetate (DEPO-MEDROL) injection 40 mg    Admin Date 10/19/2018 Action Given Dose 40 mg Route Intramuscular Administered By Clearnce Sorrel, RMA         Patient will keep regularly scheduled follow-up with PCP as planned.  Advised to return to clinic sooner if any issues arise.  Advised that if breathing or cough worsens, to call office right away and or go to urgent care/ER for evaluation.

## 2018-10-30 ENCOUNTER — Other Ambulatory Visit: Payer: BC Managed Care – PPO

## 2018-10-31 ENCOUNTER — Other Ambulatory Visit (INDEPENDENT_AMBULATORY_CARE_PROVIDER_SITE_OTHER): Payer: BC Managed Care – PPO

## 2018-10-31 ENCOUNTER — Other Ambulatory Visit: Payer: Self-pay

## 2018-10-31 DIAGNOSIS — R748 Abnormal levels of other serum enzymes: Secondary | ICD-10-CM

## 2018-10-31 NOTE — Addendum Note (Signed)
Addended by: WIGGINS, Arhaan Chesnut N on: 10/31/2018 08:49 AM   Modules accepted: Orders  

## 2018-10-31 NOTE — Addendum Note (Signed)
Addended by: Penne Lash on: 10/31/2018 08:49 AM   Modules accepted: Orders

## 2018-11-02 LAB — BASIC METABOLIC PANEL
BUN/Creatinine Ratio: 18 (ref 9–23)
BUN: 17 mg/dL (ref 6–24)
CALCIUM: 11.2 mg/dL — AB (ref 8.7–10.2)
CO2: 23 mmol/L (ref 20–29)
CREATININE: 0.96 mg/dL (ref 0.57–1.00)
Chloride: 102 mmol/L (ref 96–106)
GFR calc Af Amer: 76 mL/min/{1.73_m2} (ref >59)
GFR calc non Af Amer: 66 mL/min/{1.73_m2} (ref >59)
Glucose: 91 mg/dL (ref 65–99)
Potassium: 4 mmol/L (ref 3.5–5.2)
Sodium: 141 mmol/L (ref 134–144)

## 2018-11-02 LAB — ALKALINE PHOSPHATASE, ISOENZYMES
Alkaline Phosphatase: 164 IU/L — ABNORMAL HIGH (ref 39–117)
BONE FRACTION: 33 % (ref 14–68)
INTESTINAL FRAC.: 7 % (ref 0–18)
LIVER FRACTION: 60 % (ref 18–85)

## 2018-11-05 ENCOUNTER — Other Ambulatory Visit: Payer: Self-pay | Admitting: Internal Medicine

## 2018-11-05 DIAGNOSIS — R748 Abnormal levels of other serum enzymes: Secondary | ICD-10-CM

## 2018-11-05 NOTE — Progress Notes (Signed)
Order placed for f/u labs.  

## 2018-11-06 ENCOUNTER — Other Ambulatory Visit: Payer: Self-pay | Admitting: Internal Medicine

## 2018-11-06 DIAGNOSIS — R748 Abnormal levels of other serum enzymes: Secondary | ICD-10-CM

## 2018-11-06 NOTE — Progress Notes (Signed)
Order placed for abdominal ultrasound.   

## 2018-11-12 ENCOUNTER — Other Ambulatory Visit: Payer: Self-pay

## 2018-11-12 ENCOUNTER — Other Ambulatory Visit (INDEPENDENT_AMBULATORY_CARE_PROVIDER_SITE_OTHER): Payer: BC Managed Care – PPO

## 2018-11-12 DIAGNOSIS — R748 Abnormal levels of other serum enzymes: Secondary | ICD-10-CM | POA: Diagnosis not present

## 2018-11-12 LAB — VITAMIN D 25 HYDROXY (VIT D DEFICIENCY, FRACTURES): VITD: 34.92 ng/mL (ref 30.00–100.00)

## 2018-11-12 LAB — BASIC METABOLIC PANEL
BUN: 24 mg/dL — ABNORMAL HIGH (ref 6–23)
CO2: 30 mEq/L (ref 19–32)
CREATININE: 0.99 mg/dL (ref 0.40–1.20)
Calcium: 10.7 mg/dL — ABNORMAL HIGH (ref 8.4–10.5)
Chloride: 101 mEq/L (ref 96–112)
GFR: 57.92 mL/min — ABNORMAL LOW (ref >60.00)
GLUCOSE: 90 mg/dL (ref 70–99)
Potassium: 3.6 mEq/L (ref 3.5–5.1)
Sodium: 138 mEq/L (ref 135–145)

## 2018-11-12 LAB — HEPATIC FUNCTION PANEL
ALT: 21 U/L (ref 0–35)
AST: 19 U/L (ref 0–37)
Albumin: 4.4 g/dL (ref 3.5–5.2)
Alkaline Phosphatase: 151 U/L — ABNORMAL HIGH (ref 39–117)
BILIRUBIN TOTAL: 0.4 mg/dL (ref 0.2–1.2)
Bilirubin, Direct: 0.1 mg/dL (ref 0.0–0.3)
Total Protein: 7.7 g/dL (ref 6.0–8.3)

## 2018-11-12 LAB — CBC WITH DIFFERENTIAL/PLATELET
Basophils Absolute: 0.1 10*3/uL (ref 0.0–0.1)
Basophils Relative: 0.9 % (ref 0.0–3.0)
Eosinophils Absolute: 0.2 10*3/uL (ref 0.0–0.7)
Eosinophils Relative: 3.4 % (ref 0.0–5.0)
HCT: 37.4 % (ref 36.0–46.0)
Hemoglobin: 12.8 g/dL (ref 12.0–15.0)
Lymphocytes Relative: 34.7 % (ref 12.0–46.0)
Lymphs Abs: 2.1 10*3/uL (ref 0.7–4.0)
MCHC: 34.2 g/dL (ref 30.0–36.0)
MCV: 85.3 fl (ref 78.0–100.0)
Monocytes Absolute: 0.7 10*3/uL (ref 0.1–1.0)
Monocytes Relative: 11 % (ref 3.0–12.0)
NEUTROS ABS: 3.1 10*3/uL (ref 1.4–7.7)
Neutrophils Relative %: 50 % (ref 43.0–77.0)
Platelets: 304 10*3/uL (ref 150.0–400.0)
RBC: 4.39 Mil/uL (ref 3.87–5.11)
RDW: 14.8 % (ref 11.5–15.5)
WBC: 6.2 10*3/uL (ref 4.0–10.5)

## 2018-11-13 LAB — CALCIUM, IONIZED: Calcium, Ion: 5.84 mg/dL — ABNORMAL HIGH (ref 4.8–5.6)

## 2018-11-13 LAB — PARATHYROID HORMONE, INTACT (NO CA): PTH: 27 pg/mL (ref 14–64)

## 2018-11-14 ENCOUNTER — Other Ambulatory Visit: Payer: Self-pay | Admitting: Internal Medicine

## 2018-11-14 DIAGNOSIS — R748 Abnormal levels of other serum enzymes: Secondary | ICD-10-CM

## 2018-11-14 NOTE — Progress Notes (Signed)
Order placed for f/u labs.  

## 2018-11-16 ENCOUNTER — Telehealth: Payer: Self-pay | Admitting: Internal Medicine

## 2018-11-16 NOTE — Telephone Encounter (Signed)
Copied from CRM [id number]. Topic: Quick Communication - Lab Results (Clinic Use ONLY) >> Nov 15, 2018  2:25 PM Larry Sierras, LPN wrote: Called patient to inform them of  lab results. When patient returns call, triage nurse may disclose results.   Pt would like a call back with results

## 2018-11-16 NOTE — Telephone Encounter (Signed)
Attempted to call pt.  Left vm to call office to receive recommendations by Dr. Lorin Picket.

## 2018-11-25 ENCOUNTER — Encounter: Payer: Self-pay | Admitting: Internal Medicine

## 2018-12-10 ENCOUNTER — Other Ambulatory Visit: Payer: Self-pay | Admitting: Internal Medicine

## 2018-12-18 ENCOUNTER — Ambulatory Visit: Payer: BC Managed Care – PPO

## 2018-12-20 ENCOUNTER — Other Ambulatory Visit: Payer: Self-pay | Admitting: Internal Medicine

## 2018-12-24 ENCOUNTER — Encounter: Payer: Self-pay | Admitting: Internal Medicine

## 2018-12-24 NOTE — Telephone Encounter (Signed)
"  Woke up yesterday with fever 102.4 this is what happens when her cellulitis acts up", emesis times 1. Then fever subsided today no fever  Today no nausea says her leg does not  seem to be swollen, patient ask if the fever and nausea could be COVID advised patient with no respiratory symptoms I did think so , offered appointment with NP patient refused says she feels fine today, that she just wanted to make notation of what happened. Advise patient if symptoms  Return or worsen she needs to be seen at ER or tomorrow in office, patient verbalized understanding.

## 2018-12-27 ENCOUNTER — Ambulatory Visit
Admission: RE | Admit: 2018-12-27 | Discharge: 2018-12-27 | Disposition: A | Discharge status: Home / Self Care | Payer: BC Managed Care – PPO | Source: Ambulatory Visit | Attending: Internal Medicine | Admitting: Internal Medicine

## 2018-12-27 ENCOUNTER — Other Ambulatory Visit: Payer: Self-pay

## 2018-12-27 DIAGNOSIS — R748 Abnormal levels of other serum enzymes: Secondary | ICD-10-CM | POA: Diagnosis not present

## 2018-12-29 ENCOUNTER — Other Ambulatory Visit: Payer: Self-pay | Admitting: Internal Medicine

## 2018-12-29 DIAGNOSIS — N289 Disorder of kidney and ureter, unspecified: Secondary | ICD-10-CM

## 2018-12-29 DIAGNOSIS — R935 Abnormal findings on diagnostic imaging of other abdominal regions, including retroperitoneum: Secondary | ICD-10-CM

## 2018-12-29 NOTE — Progress Notes (Signed)
Order placed for referral to urology.  

## 2018-12-31 ENCOUNTER — Encounter: Payer: Self-pay | Admitting: Internal Medicine

## 2018-12-31 NOTE — Telephone Encounter (Signed)
I placed an order for referral to urology.  Pt sent my chart message stating she wanted to go to [facility] in [place].  Let me know if I need to place an order for another referral.

## 2019-01-03 ENCOUNTER — Encounter: Payer: Self-pay | Admitting: Internal Medicine

## 2019-01-22 ENCOUNTER — Other Ambulatory Visit: Payer: Self-pay | Admitting: Internal Medicine

## 2019-01-30 ENCOUNTER — Ambulatory Visit: Payer: Self-pay | Admitting: Urology

## 2019-02-01 ENCOUNTER — Encounter: Payer: Self-pay | Admitting: Internal Medicine

## 2019-02-01 ENCOUNTER — Ambulatory Visit (INDEPENDENT_AMBULATORY_CARE_PROVIDER_SITE_OTHER): Payer: BC Managed Care – PPO | Admitting: Internal Medicine

## 2019-02-01 ENCOUNTER — Other Ambulatory Visit: Payer: Self-pay

## 2019-02-01 DIAGNOSIS — N289 Disorder of kidney and ureter, unspecified: Secondary | ICD-10-CM

## 2019-02-01 DIAGNOSIS — E119 Type 2 diabetes mellitus without complications: Secondary | ICD-10-CM | POA: Diagnosis not present

## 2019-02-01 DIAGNOSIS — E7800 Pure hypercholesterolemia, unspecified: Secondary | ICD-10-CM

## 2019-02-01 DIAGNOSIS — K219 Gastro-esophageal reflux disease without esophagitis: Secondary | ICD-10-CM

## 2019-02-01 DIAGNOSIS — I10 Essential (primary) hypertension: Secondary | ICD-10-CM | POA: Diagnosis not present

## 2019-02-01 DIAGNOSIS — M7989 Other specified soft tissue disorders: Secondary | ICD-10-CM

## 2019-02-01 MED ORDER — METOPROLOL SUCCINATE ER 25 MG PO TB24: 25.0000 mg | ORAL_TABLET | Freq: Every day | ORAL | 2 refills | Status: DC

## 2019-02-01 MED ORDER — AMLODIPINE BESYLATE 5 MG PO TABS: 5.0000 mg | ORAL_TABLET | Freq: Two times a day (BID) | ORAL | 0 refills | Status: DC

## 2019-02-01 NOTE — Progress Notes (Addendum)
Patient ID: Deborah Bartlett, female   DOB: 12-06-1960, 58 y.o.   MRN: [id number]   Virtual Visit via video Note  This visit type was conducted due to national recommendations for restrictions regarding the COVID-19 pandemic (e.g. social distancing).  This format is felt to be most appropriate for this patient at this time.  All issues noted in this document were discussed and addressed.  No physical exam was performed (except for noted visual exam findings with Video Visits).   I connected with Deborah Bartlett by a video enabled telemedicine application or telephone and verified that I am speaking with the correct person using two identifiers. Location patient: home Location provider: work Persons participating in the virtual visit: patient, provider  I discussed the limitations, risks, security and privacy concerns of performing an evaluation and management service by video and the availability of in person appointments. The patient expressed understanding and agreed to proceed.  Interactive audio and video telecommunications were attempted between this provider and patient and were successful through most of the visit.  Due to increased static, we had to convert the visit to a telephone visit.  We continued and completed visit with audio only. Pt was in agreement.    Reason for visit: scheduled follow up.    HPI: She has been continuing to work.  Works in [employer].  They have continued to prepare a large number of meals.  She is standing on her feet or sitting with feet hanging down, for most of the day.  Has noticed some increased swelling in her right ankle.  Her left leg is usually the one that swells.  She has been wearing her compression hose.  Her left leg is doing well.  Right ankle is swelling some.  Resolves overnight and is down in am.  She has not been wearing her right compression hose.  Discussed the need to wear both.  Also discussed leg elevation.  No redness.  No swelling up  her leg.  No known covid exposure.  No fever.  No sob, chest congestion or cough.  No acid reflux.  No abdominal pain.  Bowels moving.  She does report her blood pressure has been elevated.  Blood pressures averaging 138-145/90s.  Taking losartan and amlodipine.  Off hctz secondary to elevated calcium.    ROS: See pertinent positives and negatives per HPI.  Past Medical History:  Diagnosis Date  . Diabetes mellitus without complication ([place])   . GERD (gastroesophageal reflux disease)   . Hypercholesteremia   . Hypertension   . Migraine    irregular, rare  . Perimenopausal 2014    Past Surgical History:  Procedure Laterality Date  . 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 [place]  . COLONOSCOPY  2014   Dr Bary Castilla  . SHOULDER ARTHROSCOPY WITH ROTATOR CUFF REPAIR Right 02/21/2018   Procedure: SHOULDER ARTHROSCOPY WITH DEBRIDEMENT DECOMPRESSION AND REPAIR OF ROTATOR CUFF TEAR;  Surgeon: Corky Mull, MD;  Location: [facility];  Service: Orthopedics;  Laterality: Right;  Diabetic - diet controlled    Family History  Problem Relation Age of Onset  . Hypertension Mother   . Diabetes Mother   . Diabetes Maternal Grandmother   . Diabetes Maternal Grandfather   . Breast cancer Neg Hx   . Colon cancer Neg Hx     SOCIAL HX: reviewed.    Current Outpatient Medications:  .  amLODipine (NORVASC) 5 MG tablet,  Take 1 tablet (5 mg total) by mouth 2 (two) times daily., Disp: 180 tablet, Rfl: 0 .  aspirin 81 MG tablet, Take 81 mg by mouth daily., Disp: , Rfl:  .  atorvastatin (LIPITOR) 20 MG tablet, TAKE 1 TABLET BY MOUTH EVERY DAY, Disp: 90 tablet, Rfl: 1 .  atorvastatin (LIPITOR) 40 MG tablet, Take 1 tablet (40 mg total) by mouth daily., Disp: 90 tablet, Rfl: 1 .  glucose blood test strip, Use as instructed to check blood sugars 3-4 times a week, twice a day.  E11.9 Dispense One Touch verio test strips, Disp: 100 each, Rfl: 12 .   Lancets (ONETOUCH ULTRASOFT) lancets, Use as instructed to check Blood sugars 3-4 times a week, twice a day.  Dispense One Touch brand. E11.9, Disp: 100 each, Rfl: 12 .  losartan (COZAAR) 100 MG tablet, TAKE 1 TABLET BY MOUTH EVERY DAY, Disp: 90 tablet, Rfl: 1 .  losartan-hydrochlorothiazide (HYZAAR) 100-25 MG tablet, TAKE 1 TABLET BY MOUTH EVERY DAY, Disp: 90 tablet, Rfl: 1 .  Multiple Vitamin (MULTIVITAMIN) capsule, Take 1 capsule by mouth daily., Disp: , Rfl:  .  naproxen sodium (ALEVE) 220 MG tablet, Take 220 mg by mouth 2 (two) times daily as needed., Disp: , Rfl:  .  omeprazole (PRILOSEC) 20 MG capsule, Take 20 mg by mouth daily., Disp: , Rfl:  .  oxycodone (OXY-IR) 5 MG capsule, Take 1-2 capsules (5-10 mg total) by mouth every 4 (four) hours as needed., Disp: 50 capsule, Rfl: 0 .  solifenacin (VESICARE) 5 MG tablet, TAKE 1 TABLET BY MOUTH EVERY DAY, Disp: 90 tablet, Rfl: 0 .  hydrochlorothiazide (HYDRODIURIL) 25 MG tablet, TAKE 1 TABLET BY MOUTH EVERY DAY (Patient not taking: Reported on 02/01/2019), Disp: 90 tablet, Rfl: 1 .  metoprolol succinate (TOPROL-XL) 25 MG 24 hr tablet, Take 1 tablet (25 mg total) by mouth daily., Disp: 30 tablet, Rfl: 2  EXAM:  VITALS per patient if applicable: [id number]  GENERAL: alert, oriented, appears well and in no acute distress  HEENT: atraumatic, conjunttiva clear, no obvious abnormalities on inspection of external nose and ears  NECK: normal movements of the head and neck  LUNGS: on inspection no signs of respiratory distress, breathing rate appears normal, no obvious gross SOB, gasping or wheezing  CV: no obvious cyanosis  PSYCH/NEURO: pleasant and cooperative, no obvious depression or anxiety, speech and thought processing grossly intact  ASSESSMENT AND PLAN:  Discussed the following assessment and plan:  Diabetes mellitus (HCC) Low carb diet and exercise.  Follow met b and a1c.    Essential hypertension, benign Blood pressure elevated.   Continue losartan and amlodipine. Off hctz secondary to elevated calcium.  Will add toprol XL 79m q day.  Check blood pressures.  Follow metabolic panel.  Schedule f/u soon to reassess blood pressure.    GERD (gastroesophageal reflux disease) Controlled on current medication regimen.  Follow.    Hypercholesterolemia Low cholesterol diet and exercise.  Follow lipid panel.    Swelling of lower extremity Swelling of right ankle.  Left leg is doing well with compression hose.  Right ankle - swelling.  Better/resolved in am.  Instructed to wear compression hose on right and left leg.  Elevate legs when sitting.  Follow.    Kidney lesion Found on recent ultrasound.  Scheduled to see urology 02/27/19 for further evaluation and w/up.      I discussed the assessment and treatment plan with the patient. The patient was provided an opportunity to ask questions  and all were answered. The patient agreed with the plan and demonstrated an understanding of the instructions.   The patient was advised to call back or seek an in-person evaluation if the symptoms worsen or if the condition fails to improve as anticipated.  I provided 25 minutes of non-face-to-face time during this encounter.   Einar Pheasant, MD

## 2019-02-04 ENCOUNTER — Encounter: Payer: Self-pay | Admitting: Internal Medicine

## 2019-02-04 DIAGNOSIS — N289 Disorder of kidney and ureter, unspecified: Secondary | ICD-10-CM | POA: Insufficient documentation

## 2019-02-04 DIAGNOSIS — M7989 Other specified soft tissue disorders: Secondary | ICD-10-CM | POA: Insufficient documentation

## 2019-02-04 NOTE — Assessment & Plan Note (Signed)
Low cholesterol diet and exercise.  Follow lipid panel.   

## 2019-02-04 NOTE — Assessment & Plan Note (Signed)
Swelling of right ankle.  Left leg is doing well with compression hose.  Right ankle - swelling.  Better/resolved in am.  Instructed to wear compression hose on right and left leg.  Elevate legs when sitting.  Follow.

## 2019-02-04 NOTE — Assessment & Plan Note (Signed)
Low carb diet and exercise.  Follow met b and a1c.   

## 2019-02-04 NOTE — Assessment & Plan Note (Addendum)
Blood pressure elevated.  Continue losartan and amlodipine. Off hctz secondary to elevated calcium.  Will add toprol XL 25mg  q day.  Check blood pressures.  Follow metabolic panel.  Schedule f/u soon to reassess blood pressure.

## 2019-02-04 NOTE — Assessment & Plan Note (Signed)
Controlled on current medication regimen.  Follow.   

## 2019-02-04 NOTE — Assessment & Plan Note (Signed)
Found on recent ultrasound.  Scheduled to see urology 02/27/19 for further evaluation and w/up.

## 2019-02-07 ENCOUNTER — Other Ambulatory Visit: Payer: Self-pay | Admitting: Internal Medicine

## 2019-02-27 ENCOUNTER — Other Ambulatory Visit: Payer: Self-pay | Admitting: Internal Medicine

## 2019-02-28 ENCOUNTER — Other Ambulatory Visit: Payer: Self-pay | Admitting: Internal Medicine

## 2019-03-11 ENCOUNTER — Encounter: Payer: Self-pay | Admitting: Internal Medicine

## 2019-03-12 ENCOUNTER — Other Ambulatory Visit: Payer: Self-pay

## 2019-03-12 ENCOUNTER — Encounter: Payer: Self-pay | Admitting: Internal Medicine

## 2019-03-12 ENCOUNTER — Other Ambulatory Visit: Payer: Self-pay | Admitting: Internal Medicine

## 2019-03-12 NOTE — Telephone Encounter (Signed)
Called pt to let her know that rx was sent on 03/01/19. Confirmed doing ok. Will monitor pressure

## 2019-03-16 ENCOUNTER — Other Ambulatory Visit: Payer: Self-pay | Admitting: Internal Medicine

## 2019-04-01 ENCOUNTER — Other Ambulatory Visit: Payer: Self-pay

## 2019-04-01 ENCOUNTER — Ambulatory Visit (INDEPENDENT_AMBULATORY_CARE_PROVIDER_SITE_OTHER): Payer: BC Managed Care – PPO | Admitting: Internal Medicine

## 2019-04-01 DIAGNOSIS — I10 Essential (primary) hypertension: Secondary | ICD-10-CM

## 2019-04-01 DIAGNOSIS — K219 Gastro-esophageal reflux disease without esophagitis: Secondary | ICD-10-CM

## 2019-04-01 DIAGNOSIS — M7989 Other specified soft tissue disorders: Secondary | ICD-10-CM

## 2019-04-01 DIAGNOSIS — E7800 Pure hypercholesterolemia, unspecified: Secondary | ICD-10-CM

## 2019-04-01 DIAGNOSIS — E279 Disorder of adrenal gland, unspecified: Secondary | ICD-10-CM

## 2019-04-01 DIAGNOSIS — E119 Type 2 diabetes mellitus without complications: Secondary | ICD-10-CM

## 2019-04-01 DIAGNOSIS — N289 Disorder of kidney and ureter, unspecified: Secondary | ICD-10-CM

## 2019-04-01 MED ORDER — METOPROLOL SUCCINATE ER 50 MG PO TB24: 50.0000 mg | ORAL_TABLET | Freq: Every day | ORAL | 1 refills | Status: DC

## 2019-04-01 MED ORDER — AMLODIPINE BESYLATE 5 MG PO TABS: 5.0000 mg | ORAL_TABLET | Freq: Two times a day (BID) | ORAL | 1 refills | Status: DC

## 2019-04-01 NOTE — Progress Notes (Signed)
Patient ID: Deborah Bartlett, female   DOB: 14-Apr-1961, 58 y.o.   MRN: [id number]   Virtual Visit via video Note  This visit type was conducted due to national recommendations for restrictions regarding the COVID-19 pandemic (e.g. social distancing).  This format is felt to be most appropriate for this patient at this time.  All issues noted in this document were discussed and addressed.  No physical exam was performed (except for noted visual exam findings with Video Visits).   I connected with Deborah Bartlett by a video enabled telemedicine application and verified that I am speaking with the correct person using two identifiers. Location patient: home Location provider: work Persons participating in the virtual visit: patient, provider  I discussed the limitations, risks, security and privacy concerns of performing an evaluation and management service by video and the availability of in person appointments. The patient expressed understanding and agreed to proceed.   Reason for visit: follow up appt.   HPI: Reports her blood pressure is still elevated.  Blood pressures averaging 148-155/97-103.  Taking losartan, amlodipine and toprol.  No chest pain.  No sob.  No acid reflux.  No abdominal pain.  Bowels moving.  Recently evaluated for elevated alkaline phosphatase.  Had abdominal ultrasound with revealed some changes c/w fatty liver.  Was found to have 1 centimeter echogenic structure - right kidney.  Was referred to urology.  Had CT scan.  CT revealed no acute abnormality of the kidney.  There was noted 1.9 cm benign right adrenal lesion (adenoma).  Discussed with her today.  Discussed further evaluation.  Agreeable.  Legs doing well.  No increased swelling.     ROS: See pertinent positives and negatives per HPI.  Past Medical History:  Diagnosis Date  . Diabetes mellitus without complication ([place])   . GERD (gastroesophageal reflux disease)   . Hypercholesteremia   . Hypertension   .  Migraine    irregular, rare  . Perimenopausal 2014    Past Surgical History:  Procedure Laterality Date  . 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 [place]  . COLONOSCOPY  2014   Dr Bary Castilla  . SHOULDER ARTHROSCOPY WITH ROTATOR CUFF REPAIR Right 02/21/2018   Procedure: SHOULDER ARTHROSCOPY WITH DEBRIDEMENT DECOMPRESSION AND REPAIR OF ROTATOR CUFF TEAR;  Surgeon: Corky Mull, MD;  Location: [facility];  Service: Orthopedics;  Laterality: Right;  Diabetic - diet controlled    Family History  Problem Relation Age of Onset  . Hypertension Mother   . Diabetes Mother   . Diabetes Maternal Grandmother   . Diabetes Maternal Grandfather   . Breast cancer Neg Hx   . Colon cancer Neg Hx     SOCIAL HX: reviewed.    Current Outpatient Medications:  .  amLODipine (NORVASC) 5 MG tablet, Take 1 tablet (5 mg total) by mouth 2 (two) times daily., Disp: 180 tablet, Rfl: 1 .  aspirin 81 MG tablet, Take 81 mg by mouth daily., Disp: , Rfl:  .  atorvastatin (LIPITOR) 40 MG tablet, TAKE 1 TABLET BY MOUTH EVERY DAY, Disp: 90 tablet, Rfl: 1 .  glucose blood test strip, Use as instructed to check blood sugars 3-4 times a week, twice a day.  E11.9 Dispense One Touch verio test strips, Disp: 100 each, Rfl: 12 .  Lancets (ONETOUCH ULTRASOFT) lancets, Use as instructed to check Blood sugars 3-4 times a week, twice a day.  Dispense One Touch brand. E11.9,  Disp: 100 each, Rfl: 12 .  losartan (COZAAR) 100 MG tablet, TAKE 1 TABLET BY MOUTH EVERY DAY, Disp: 90 tablet, Rfl: 1 .  metoprolol succinate (TOPROL XL) 50 MG 24 hr tablet, Take 1 tablet (50 mg total) by mouth daily. Take with or immediately following a meal., Disp: 90 tablet, Rfl: 1 .  Multiple Vitamin (MULTIVITAMIN) capsule, Take 1 capsule by mouth daily., Disp: , Rfl:  .  omeprazole (PRILOSEC) 20 MG capsule, Take 20 mg by mouth daily., Disp: , Rfl:  .  solifenacin (VESICARE) 5 MG tablet, TAKE 1  TABLET BY MOUTH EVERY DAY, Disp: 90 tablet, Rfl: 0  EXAM:  VITALS per patient if applicable: 115- 520/80-223.    GENERAL: alert, oriented, appears well and in no acute distress  HEENT: atraumatic, conjunttiva clear, no obvious abnormalities on inspection of external nose and ears  NECK: normal movements of the head and neck  LUNGS: on inspection no signs of respiratory distress, breathing rate appears normal, no obvious gross SOB, gasping or wheezing  CV: no obvious cyanosis  PSYCH/NEURO: pleasant and cooperative, no obvious depression or anxiety, speech and thought processing grossly intact  ASSESSMENT AND PLAN:  Discussed the following assessment and plan:  Diabetes mellitus (HCC) Low carb diet and exercise.  Follow met b and a1c.    Essential hypertension, benign Blood pressure remains elevated.  Increase toprol to 57m q day.  Follow pressures.  Follow metabolic panel.  Adrenal lesion (adenoma) noted on recent CT scan.  Refer to endocrinology for further w/up and evaluation.  Persistent elevated blood pressure.    GERD (gastroesophageal reflux disease) Controlled on current regimen.    Hypercholesterolemia Low cholesterol diet and exercise. Follow lipid panel.    Kidney lesion Noted on recent ultrasound.  Evaluated by urology.  CT as outlined.  Felt no further w/up.   Left leg swelling Doing well.  Previously saw vascular surgery.  Wears compression hose.  Follow.   Lesion of adrenal gland (HAlexander Found on recent CT.  Appears to be c/w adrenal adenoma - per radiology.  Persistent elevated blood pressure.  Treat.  Refer to endocrinology for further w/up of the adrenal lesion.      I discussed the assessment and treatment plan with the patient. The patient was provided an opportunity to ask questions and all were answered. The patient agreed with the plan and demonstrated an understanding of the instructions.   The patient was advised to call back or seek an in-person  evaluation if the symptoms worsen or if the condition fails to improve as anticipated.   CEinar Pheasant MD

## 2019-04-06 ENCOUNTER — Encounter: Payer: Self-pay | Admitting: Internal Medicine

## 2019-04-06 DIAGNOSIS — E279 Disorder of adrenal gland, unspecified: Secondary | ICD-10-CM | POA: Insufficient documentation

## 2019-04-06 NOTE — Assessment & Plan Note (Signed)
Doing well.  Previously saw vascular surgery.  Wears compression hose.  Follow.

## 2019-04-06 NOTE — Assessment & Plan Note (Signed)
Noted on recent ultrasound.  Evaluated by urology.  CT as outlined.  Felt no further w/up.

## 2019-04-06 NOTE — Assessment & Plan Note (Signed)
Low carb diet and exercise.  Follow met b and a1c.   

## 2019-04-06 NOTE — Assessment & Plan Note (Signed)
Controlled on current regimen.   

## 2019-04-06 NOTE — Assessment & Plan Note (Signed)
Blood pressure remains elevated.  Increase toprol to 50mg  q day.  Follow pressures.  Follow metabolic panel.  Adrenal lesion (adenoma) noted on recent CT scan.  Refer to endocrinology for further w/up and evaluation.  Persistent elevated blood pressure.

## 2019-04-06 NOTE — Assessment & Plan Note (Signed)
Found on recent CT.  Appears to be c/w adrenal adenoma - per radiology.  Persistent elevated blood pressure.  Treat.  Refer to endocrinology for further w/up of the adrenal lesion.

## 2019-04-06 NOTE — Assessment & Plan Note (Signed)
Low cholesterol diet and exercise.  Follow lipid panel.   

## 2019-04-14 ENCOUNTER — Other Ambulatory Visit: Payer: Self-pay | Admitting: Internal Medicine

## 2019-04-30 ENCOUNTER — Other Ambulatory Visit: Payer: Self-pay | Admitting: Internal Medicine

## 2019-07-15 ENCOUNTER — Other Ambulatory Visit: Payer: Self-pay | Admitting: Internal Medicine

## 2019-07-16 LAB — HM DIABETES EYE EXAM

## 2019-07-29 ENCOUNTER — Ambulatory Visit: Payer: BC Managed Care – PPO | Admitting: Internal Medicine

## 2019-07-31 ENCOUNTER — Encounter: Payer: Self-pay | Admitting: Internal Medicine

## 2019-08-26 ENCOUNTER — Encounter: Payer: Self-pay | Admitting: Internal Medicine

## 2019-08-27 ENCOUNTER — Encounter: Payer: Self-pay | Admitting: Internal Medicine

## 2019-08-27 ENCOUNTER — Other Ambulatory Visit: Payer: Self-pay

## 2019-08-27 MED ORDER — SOLIFENACIN SUCCINATE 5 MG PO TABS: 5.0000 mg | ORAL_TABLET | Freq: Every day | ORAL | 2 refills | Status: DC

## 2019-09-23 ENCOUNTER — Other Ambulatory Visit: Payer: Self-pay | Admitting: Internal Medicine

## 2019-10-09 ENCOUNTER — Other Ambulatory Visit: Payer: Self-pay | Admitting: Internal Medicine

## 2019-10-19 ENCOUNTER — Ambulatory Visit: Payer: BC Managed Care – PPO | Attending: Internal Medicine

## 2019-10-19 DIAGNOSIS — Z23 Encounter for immunization: Secondary | ICD-10-CM

## 2019-10-19 NOTE — Progress Notes (Signed)
   Covid-19 Vaccination Clinic  Name:  MELIDA NORTHINGTON    MRN: [id number] DOB: 03-19-1961  10/19/2019  Ms. Pauls was observed post Covid-19 immunization for 15 minutes without incidence. She was provided with Vaccine Information Sheet and instruction to access the V-Safe system.   Ms. Cowman was instructed to call 911 with any severe reactions post vaccine: Marland Kitchen Difficulty breathing  . Swelling of your face and throat  . A fast heartbeat  . A bad rash all over your body  . Dizziness and weakness    Immunizations Administered    Name Date Dose VIS Date Route   Moderna COVID-19 Vaccine 10/19/2019  3:49 PM 0.5 mL 07/23/2019 Intramuscular   Manufacturer: Moderna   Lot: [id number]   NDC: 73312-508-71

## 2019-11-16 ENCOUNTER — Ambulatory Visit: Payer: BC Managed Care – PPO | Attending: Internal Medicine

## 2019-11-16 DIAGNOSIS — Z23 Encounter for immunization: Secondary | ICD-10-CM

## 2019-11-16 NOTE — Progress Notes (Signed)
   Covid-19 Vaccination Clinic  Name:  IRMA ROULHAC    MRN: [id number] DOB: 10-Mar-1961  11/16/2019  Ms. Bagheri was observed post Covid-19 immunization for 15 minutes without incident. She was provided with Vaccine Information Sheet and instruction to access the V-Safe system.   Ms. Wessling was instructed to call 911 with any severe reactions post vaccine: Marland Kitchen Difficulty breathing  . Swelling of face and throat  . A fast heartbeat  . A bad rash all over body  . Dizziness and weakness   Immunizations Administered    Name Date Dose VIS Date Route   Moderna COVID-19 Vaccine 11/16/2019  1:00 PM 0.5 mL 07/23/2019 Intramuscular   Manufacturer: Gala Murdoch   Lot: [id number]   NDC: 71836-725-50

## 2019-11-19 ENCOUNTER — Other Ambulatory Visit: Payer: Self-pay | Admitting: Internal Medicine

## 2020-03-10 ENCOUNTER — Ambulatory Visit (INDEPENDENT_AMBULATORY_CARE_PROVIDER_SITE_OTHER): Payer: BC Managed Care – PPO | Admitting: Internal Medicine

## 2020-03-10 ENCOUNTER — Other Ambulatory Visit: Payer: Self-pay

## 2020-03-10 DIAGNOSIS — I10 Essential (primary) hypertension: Secondary | ICD-10-CM

## 2020-03-10 DIAGNOSIS — E279 Disorder of adrenal gland, unspecified: Secondary | ICD-10-CM | POA: Diagnosis not present

## 2020-03-10 DIAGNOSIS — K219 Gastro-esophageal reflux disease without esophagitis: Secondary | ICD-10-CM

## 2020-03-10 DIAGNOSIS — R200 Anesthesia of skin: Secondary | ICD-10-CM

## 2020-03-10 DIAGNOSIS — E1165 Type 2 diabetes mellitus with hyperglycemia: Secondary | ICD-10-CM

## 2020-03-10 DIAGNOSIS — M7989 Other specified soft tissue disorders: Secondary | ICD-10-CM

## 2020-03-10 DIAGNOSIS — E7800 Pure hypercholesterolemia, unspecified: Secondary | ICD-10-CM | POA: Diagnosis not present

## 2020-03-10 NOTE — Progress Notes (Signed)
Patient ID: Deborah Bartlett, female   DOB: 26-Apr-1961, 59 y.o.   MRN: [id number]   Subjective:    Patient ID: Deborah Bartlett, female    DOB: [birth year], 59 y.o.   MRN: [id number]  HPI This visit occurred during the SARS-CoV-2 public health emergency.  Safety protocols were in place, including screening questions prior to the visit, additional usage of staff PPE, and extensive cleaning of exam room while observing appropriate contact time as indicated for disinfecting solutions.  Patient here for a scheduled follow up.  She reports having a stabbing pain - right lateral thigh.  (feels like on fire). Intermittent.  Present over the last few weeks.  Change in sensation.  No back pain.  Tries to stay active.  Is working.  No chest pain or sob.  No acid reflux.  No abdominal pain.  Bowels moving.  On losartan, amlodipine and metoprolol.  toprol increased last visit.  Handling stress.    Past Medical History:  Diagnosis Date  . Diabetes mellitus without complication (HCC)   . GERD (gastroesophageal reflux disease)   . Hypercholesteremia   . Hypertension   . Migraine    irregular, rare  . Perimenopausal 2014   Past Surgical History:  Procedure Laterality Date  . 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 CESAREAN SECTION  1990, 1993, 1999  . COLONOSCOPY  2014   Dr Lemar Livings  . SHOULDER ARTHROSCOPY WITH ROTATOR CUFF REPAIR Right 02/21/2018   Procedure: SHOULDER ARTHROSCOPY WITH DEBRIDEMENT DECOMPRESSION AND REPAIR OF ROTATOR CUFF TEAR;  Surgeon: Christena Flake, MD;  Location: [facility];  Service: Orthopedics;  Laterality: Right;  Diabetic - diet controlled   Family History  Problem Relation Age of Onset  . Hypertension Mother   . Diabetes Mother   . Diabetes Maternal Grandmother   . Diabetes Maternal Grandfather   . Breast cancer Neg Hx   . Colon cancer Neg Hx    Social History   Socioeconomic History  . Marital status: Married    Spouse name: Not on file  .  Number of children: Not on file  . Years of education: Not on file  . Highest education level: Not on file  Occupational History  . Not on file  Tobacco Use  . Smoking status: Never Smoker  . Smokeless tobacco: Never Used  Vaping Use  . Vaping Use: Never used  Substance and Sexual Activity  . Alcohol use: No    Alcohol/week: 0.0 standard drinks  . Drug use: No  . Sexual activity: Not on file  Other Topics Concern  . Not on file  Social History Narrative  . Not on file   Social Determinants of Health   Financial Resource Strain:   . Difficulty of Paying Living Expenses:   Food Insecurity:   . Worried About Programme researcher, broadcasting/film/video in the Last Year:   . Barista in the Last Year:   Transportation Needs:   . Freight forwarder (Medical):   Marland Kitchen Lack of Transportation (Non-Medical):   Physical Activity:   . Days of Exercise per Week:   . Minutes of Exercise per Session:   Stress:   . Feeling of Stress :   Social Connections:   . Frequency of Communication with Friends and Family:   . Frequency of Social Gatherings with Friends and Family:   . Attends Religious Services:   . Active Member of Clubs or Organizations:   .  Attends Archivist Meetings:   Marland Kitchen Marital Status:     Outpatient Encounter Medications as of 03/10/2020  Medication Sig  . amLODipine (NORVASC) 5 MG tablet TAKE 1 TABLET BY MOUTH TWICE A DAY  . aspirin 81 MG tablet Take 81 mg by mouth daily.  Marland Kitchen atorvastatin (LIPITOR) 40 MG tablet TAKE 1 TABLET BY MOUTH EVERY DAY  . Lancets (ONETOUCH ULTRASOFT) lancets Use as instructed to check Blood sugars 3-4 times a week, twice a day.  Dispense One Touch brand. E11.9  . losartan (COZAAR) 100 MG tablet TAKE 1 TABLET BY MOUTH EVERY DAY  . metoprolol succinate (TOPROL-XL) 50 MG 24 hr tablet TAKE 1 TABLET (50 MG TOTAL) BY MOUTH DAILY. TAKE WITH OR IMMEDIATELY FOLLOWING A MEAL.  . Multiple Vitamin (MULTIVITAMIN) capsule Take 1 capsule by mouth daily.  Marland Kitchen  omeprazole (PRILOSEC) 20 MG capsule Take 20 mg by mouth daily.  Glory Rosebush VERIO test strip CHECK BLOOD SUGAR TWICE A DAY 3 TO 4 TIMES A WEEK  . solifenacin (VESICARE) 5 MG tablet Take 1 tablet (5 mg total) by mouth daily.   No facility-administered encounter medications on file as of 03/10/2020.    Review of Systems  Constitutional: Negative for appetite change and unexpected weight change.  HENT: Negative for congestion and sinus pressure.   Respiratory: Negative for cough, chest tightness and shortness of breath.   Cardiovascular: Negative for chest pain, palpitations and leg swelling.  Gastrointestinal: Negative for abdominal pain, diarrhea, nausea and vomiting.  Genitourinary: Negative for difficulty urinating and dysuria.  Musculoskeletal: Negative for joint swelling and myalgias.       Sharp stabbing pain - right lateral thigh as outlined.    Skin: Negative for color change and rash.  Neurological: Negative for dizziness, light-headedness and headaches.  Psychiatric/Behavioral: Negative for agitation and dysphoric mood.       Objective:    Physical Exam Vitals reviewed.  Constitutional:      General: She is not in acute distress.    Appearance: Normal appearance.  HENT:     Head: Normocephalic and atraumatic.     Right Ear: External ear normal.     Left Ear: External ear normal.  Eyes:     General: No scleral icterus.       Right eye: No discharge.        Left eye: No discharge.     Conjunctiva/sclera: Conjunctivae normal.  Neck:     Thyroid: No thyromegaly.  Cardiovascular:     Rate and Rhythm: Normal rate and regular rhythm.  Pulmonary:     Effort: No respiratory distress.     Breath sounds: Normal breath sounds. No wheezing.  Abdominal:     General: Bowel sounds are normal.     Palpations: Abdomen is soft.     Tenderness: There is no abdominal tenderness.  Musculoskeletal:        General: No swelling or tenderness.     Cervical back: Neck supple. No  tenderness.  Lymphadenopathy:     Cervical: No cervical adenopathy.  Skin:    Findings: No erythema or rash.  Neurological:     Mental Status: She is alert.     Comments: Decreased sensation to pin prick - right lateral thigh.    Psychiatric:        Mood and Affect: Mood normal.        Behavior: Behavior normal.     BP 138/76   Pulse 76   Temp 98.6 F (37  C)   Resp 16   Wt 155 lb (70.3 kg)   LMP 02/13/2013   SpO2 98%   BMI 30.27 kg/m  Wt Readings from Last 3 Encounters:  03/10/20 155 lb (70.3 kg)  10/19/18 154 lb 6.4 oz (70 kg)  10/01/18 155 lb (70.3 kg)     Lab Results  Component Value Date   WBC 6.2 11/12/2018   HGB 12.8 11/12/2018   HCT 37.4 11/12/2018   PLT 304.0 11/12/2018   GLUCOSE 90 11/12/2018   CHOL 220 (H) 10/16/2018   TRIG 257.0 (H) 10/16/2018   HDL 46.60 10/16/2018   LDLDIRECT 134.0 10/16/2018   LDLCALC 113 (H) 07/13/2016   ALT 21 11/12/2018   AST 19 11/12/2018   NA 138 11/12/2018   K 3.6 11/12/2018   CL 101 11/12/2018   CREATININE 0.99 11/12/2018   BUN 24 (H) 11/12/2018   CO2 30 11/12/2018   TSH 2.04 10/01/2018   HGBA1C 6.5 10/01/2018   MICROALBUR 1.6 05/08/2017    US Abdomen Complete  Result Date: 12/27/2018 CLINICAL DATA:  Elevated alkaline phosphatase. EXAM: ABDOMEN ULTRASOUND COMPLETE COMPARISON:  CT 07/23/2010 FINDINGS: Gallbladder: No gallstones or wall thickening visualized. No sonographic Murphy sign noted by sonographer. Common bile duct: Diameter: 3.7 mm.  Normal. Liver: Slightly increased echogenicity suggesting mild fatty change. No focal lesion. Portal vein is patent on color Doppler imaging with normal direction of blood flow towards the liver. IVC: No abnormality visualized. Pancreas: Head and body are normal. Tail is not seen because of overlying bowel gas. Spleen: Size and appearance within normal limits. Right Kidney: Length: 10.2 cm. 10 mm slightly echogenic lobulation along the surface of lower pole of the kidney most likely  represent a small hyperdense cyst or cortical lobulation. Small solid mass is not excluded however. Left Kidney: Length: 11 5 cm. Echogenicity within normal limits. No mass or hydronephrosis visualized. Abdominal aorta: No aneurysm visualized. Other findings: No ascites. IMPRESSION: Slightly echogenic liver suggesting fatty change. This could be associated with and somatic abnormalities. No focal lesion, gallbladder disease or ductal disease. 1 cm echogenic structure along the surface of the lower pole of the right kidney. Statistically, this is likely to represent a hemorrhagic cyst or cortical lobulation. Mass lesion is not excluded. Follow-up ultrasound could be considered in 6 months to reassess. Electronically Signed   By: Nelson Chimes M.D.   On: 12/27/2018 09:09       Assessment & Plan:   Problem List Items Addressed This Visit    Diabetes mellitus ([place])    Low carb diet and exercise.  Follow met b and a1c.       Relevant Orders   Hemoglobin A1c   Microalbumin / creatinine urine ratio   Essential hypertension, benign    Blood pressure:  136-138/82.  Continue metoprolol, losartan and amlodipine.  Follow pressures.  Follow metabolic panel.       Relevant Orders   CBC with Differential/Platelet   TSH   Basic metabolic panel   GERD (gastroesophageal reflux disease)    No upper symptoms reported on omeprazole.  Follow.       Hypercholesterolemia    Low cholesterol diet and exercise.  Follow lipid panel.        Relevant Orders   Hepatic function panel   Lipid panel   Lesion of adrenal gland ([place])    Found on CT.  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  Had discussed given persistent elevated blood pressure, referral to  endocrinology.  Apparently did not go to appt.  See if agreeable for referral.       Swelling of lower extremity    Stable.  Wearing compression hose.       Thigh numbness    Stabbing sensation in right lateral thigh with change in sensation.   Discussed further w/up.  Discussed referral to neurology for question of nerve conduction studies.  Agreeable.       Relevant Orders   Ambulatory referral to Neurology       Einar Pheasant, MD

## 2020-03-15 ENCOUNTER — Encounter: Payer: Self-pay | Admitting: Internal Medicine

## 2020-03-15 ENCOUNTER — Telehealth: Payer: Self-pay | Admitting: Internal Medicine

## 2020-03-15 DIAGNOSIS — E279 Disorder of adrenal gland, unspecified: Secondary | ICD-10-CM

## 2020-03-15 NOTE — Assessment & Plan Note (Signed)
Stable.  Wearing compression hose.  

## 2020-03-15 NOTE — Telephone Encounter (Signed)
Have discussed with pt previously regarding appt with endocrinology for adrenal lesion - noted on previous CT.  Had schedule an appt, but it appears she did not go.  Please confirm and if she did not go, need to reschedule.  If went, then need records.  Thanks.

## 2020-03-15 NOTE — Assessment & Plan Note (Signed)
Low cholesterol diet and exercise.  Follow lipid panel.   

## 2020-03-15 NOTE — Assessment & Plan Note (Signed)
Blood pressure:  136-138/82.  Continue metoprolol, losartan and amlodipine.  Follow pressures.  Follow metabolic panel.

## 2020-03-15 NOTE — Assessment & Plan Note (Signed)
Low carb diet and exercise.  Follow met b and a1c.  

## 2020-03-15 NOTE — Assessment & Plan Note (Signed)
Stabbing sensation in right lateral thigh with change in sensation.  Discussed further w/up.  Discussed referral to neurology for question of nerve conduction studies.  Agreeable.

## 2020-03-15 NOTE — Assessment & Plan Note (Signed)
No upper symptoms reported on omeprazole.  Follow.

## 2020-03-15 NOTE — Assessment & Plan Note (Signed)
Found on CT.  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  Had discussed given persistent elevated blood pressure, referral to endocrinology.  Apparently did not go to appt.  See if agreeable for referral.

## 2020-03-16 NOTE — Telephone Encounter (Signed)
LMTCB

## 2020-03-17 NOTE — Telephone Encounter (Signed)
Pt called returning your call   please  Call [phone]

## 2020-03-17 NOTE — Telephone Encounter (Signed)
Pt did not go to appt but is agreeable to see endocrinology. They called to schedule her last august so she may need new referral to them.

## 2020-03-18 NOTE — Telephone Encounter (Signed)
Patient is agreeable to see [facility] in [place]. She is aware referral has been placed,

## 2020-03-18 NOTE — Telephone Encounter (Signed)
Order placed for endocrinology referral.  I placed order for endo at [facility] (in [place]).  Let me know if agreeable or wants to go to [place].

## 2020-03-18 NOTE — Addendum Note (Signed)
Addended by: Charm Barges on: 03/18/2020 05:31 AM   Modules accepted: Orders

## 2020-03-23 ENCOUNTER — Other Ambulatory Visit (INDEPENDENT_AMBULATORY_CARE_PROVIDER_SITE_OTHER): Payer: BC Managed Care – PPO

## 2020-03-23 ENCOUNTER — Other Ambulatory Visit: Payer: Self-pay

## 2020-03-23 DIAGNOSIS — I10 Essential (primary) hypertension: Secondary | ICD-10-CM | POA: Diagnosis not present

## 2020-03-23 DIAGNOSIS — E1165 Type 2 diabetes mellitus with hyperglycemia: Secondary | ICD-10-CM | POA: Diagnosis not present

## 2020-03-23 DIAGNOSIS — E7800 Pure hypercholesterolemia, unspecified: Secondary | ICD-10-CM | POA: Diagnosis not present

## 2020-03-23 LAB — CBC WITH DIFFERENTIAL/PLATELET
Basophils Absolute: 0.1 10*3/uL (ref 0.0–0.1)
Basophils Relative: 1.1 % (ref 0.0–3.0)
Eosinophils Absolute: 0.3 10*3/uL (ref 0.0–0.7)
Eosinophils Relative: 6.3 % — ABNORMAL HIGH (ref 0.0–5.0)
HCT: 36.9 % (ref 36.0–46.0)
Hemoglobin: 12.5 g/dL (ref 12.0–15.0)
Lymphocytes Relative: 32.5 % (ref 12.0–46.0)
Lymphs Abs: 1.5 10*3/uL (ref 0.7–4.0)
MCHC: 33.8 g/dL (ref 30.0–36.0)
MCV: 87.7 fl (ref 78.0–100.0)
Monocytes Absolute: 0.5 10*3/uL (ref 0.1–1.0)
Monocytes Relative: 10.7 % (ref 3.0–12.0)
Neutro Abs: 2.3 10*3/uL (ref 1.4–7.7)
Neutrophils Relative %: 49.4 % (ref 43.0–77.0)
Platelets: 251 10*3/uL (ref 150.0–400.0)
RBC: 4.21 Mil/uL (ref 3.87–5.11)
RDW: 14.1 % (ref 11.5–15.5)
WBC: 4.6 10*3/uL (ref 4.0–10.5)

## 2020-03-23 LAB — BASIC METABOLIC PANEL
BUN: 21 mg/dL (ref 6–23)
CO2: 27 mEq/L (ref 19–32)
Calcium: 9.9 mg/dL (ref 8.4–10.5)
Chloride: 108 mEq/L (ref 96–112)
Creatinine, Ser: 0.95 mg/dL (ref 0.40–1.20)
GFR: 60.45 mL/min (ref >60.00)
Glucose, Bld: 95 mg/dL (ref 70–99)
Potassium: 4.3 mEq/L (ref 3.5–5.1)
Sodium: 140 mEq/L (ref 135–145)

## 2020-03-23 LAB — HEPATIC FUNCTION PANEL
ALT: 16 U/L (ref 0–35)
AST: 16 U/L (ref 0–37)
Albumin: 4.1 g/dL (ref 3.5–5.2)
Alkaline Phosphatase: 141 U/L — ABNORMAL HIGH (ref 39–117)
Bilirubin, Direct: 0.1 mg/dL (ref 0.0–0.3)
Total Bilirubin: 0.4 mg/dL (ref 0.2–1.2)
Total Protein: 6.7 g/dL (ref 6.0–8.3)

## 2020-03-23 LAB — LIPID PANEL
Cholesterol: 218 mg/dL — ABNORMAL HIGH (ref 0–200)
HDL: 40.4 mg/dL (ref >39.00)
LDL Cholesterol: 152 mg/dL — ABNORMAL HIGH (ref 0–99)
NonHDL: 177.69
Total CHOL/HDL Ratio: 5
Triglycerides: 128 mg/dL (ref 0.0–149.0)
VLDL: 25.6 mg/dL (ref 0.0–40.0)

## 2020-03-23 LAB — HEMOGLOBIN A1C: Hgb A1c MFr Bld: 6.3 % (ref 4.6–6.5)

## 2020-03-23 LAB — MICROALBUMIN / CREATININE URINE RATIO
Creatinine,U: 182.8 mg/dL
Microalb Creat Ratio: 1 mg/g (ref 0.0–30.0)
Microalb, Ur: 1.8 mg/dL (ref 0.0–1.9)

## 2020-03-23 LAB — TSH: TSH: 4.18 u[IU]/mL (ref 0.35–4.50)

## 2020-03-30 ENCOUNTER — Encounter: Payer: Self-pay | Admitting: Internal Medicine

## 2020-04-02 ENCOUNTER — Other Ambulatory Visit: Payer: Self-pay

## 2020-04-02 ENCOUNTER — Encounter: Payer: Self-pay | Admitting: Internal Medicine

## 2020-04-02 MED ORDER — ROSUVASTATIN CALCIUM 40 MG PO TABS: 40.0000 mg | ORAL_TABLET | Freq: Every day | ORAL | 1 refills | Status: DC

## 2020-04-03 ENCOUNTER — Other Ambulatory Visit: Payer: Self-pay | Admitting: Internal Medicine

## 2020-04-03 MED ORDER — AMLODIPINE BESYLATE 5 MG PO TABS: 5.0000 mg | ORAL_TABLET | Freq: Two times a day (BID) | ORAL | 1 refills | Status: DC

## 2020-05-24 ENCOUNTER — Encounter: Payer: Self-pay | Admitting: Internal Medicine

## 2020-05-25 ENCOUNTER — Other Ambulatory Visit: Payer: Self-pay

## 2020-05-25 ENCOUNTER — Other Ambulatory Visit
Admission: RE | Admit: 2020-05-25 | Discharge: 2020-05-25 | Disposition: A | Discharge status: Home / Self Care | Payer: BC Managed Care – PPO | Source: Ambulatory Visit | Attending: Surgery | Admitting: Surgery

## 2020-05-25 DIAGNOSIS — Z20822 Contact with and (suspected) exposure to covid-19: Secondary | ICD-10-CM | POA: Diagnosis not present

## 2020-05-25 DIAGNOSIS — Z01812 Encounter for preprocedural laboratory examination: Secondary | ICD-10-CM | POA: Diagnosis not present

## 2020-05-25 LAB — SARS CORONAVIRUS 2 (TAT 6-24 HRS): SARS Coronavirus 2: NEGATIVE

## 2020-05-26 ENCOUNTER — Ambulatory Visit: Payer: BC Managed Care – PPO | Admitting: Anesthesiology

## 2020-05-26 ENCOUNTER — Other Ambulatory Visit: Payer: Self-pay

## 2020-05-26 ENCOUNTER — Encounter: Payer: Self-pay | Admitting: Surgery

## 2020-05-26 ENCOUNTER — Encounter
Admission: RE | Disposition: A | Discharge status: Home / Self Care | Payer: Self-pay | Source: Home / Self Care | Attending: Surgery

## 2020-05-26 ENCOUNTER — Other Ambulatory Visit: Payer: Self-pay | Admitting: Surgery

## 2020-05-26 ENCOUNTER — Ambulatory Visit: Payer: BC Managed Care – PPO

## 2020-05-26 ENCOUNTER — Ambulatory Visit
Admission: RE | Admit: 2020-05-26 | Discharge: 2020-05-26 | Disposition: A | Discharge status: Home / Self Care | Payer: BC Managed Care – PPO | Attending: Surgery | Admitting: Surgery

## 2020-05-26 ENCOUNTER — Encounter: Payer: Self-pay | Admitting: Internal Medicine

## 2020-05-26 DIAGNOSIS — S52022A Displaced fracture of olecranon process without intraarticular extension of left ulna, initial encounter for closed fracture: Secondary | ICD-10-CM | POA: Diagnosis not present

## 2020-05-26 DIAGNOSIS — Q268 Other congenital malformations of great veins: Secondary | ICD-10-CM | POA: Insufficient documentation

## 2020-05-26 DIAGNOSIS — E785 Hyperlipidemia, unspecified: Secondary | ICD-10-CM | POA: Diagnosis not present

## 2020-05-26 DIAGNOSIS — K219 Gastro-esophageal reflux disease without esophagitis: Secondary | ICD-10-CM | POA: Insufficient documentation

## 2020-05-26 DIAGNOSIS — Z951 Presence of aortocoronary bypass graft: Secondary | ICD-10-CM | POA: Diagnosis not present

## 2020-05-26 DIAGNOSIS — Z79899 Other long term (current) drug therapy: Secondary | ICD-10-CM | POA: Insufficient documentation

## 2020-05-26 DIAGNOSIS — Z9851 Tubal ligation status: Secondary | ICD-10-CM | POA: Insufficient documentation

## 2020-05-26 DIAGNOSIS — Z7982 Long term (current) use of aspirin: Secondary | ICD-10-CM | POA: Insufficient documentation

## 2020-05-26 DIAGNOSIS — Q249 Congenital malformation of heart, unspecified: Secondary | ICD-10-CM | POA: Diagnosis not present

## 2020-05-26 DIAGNOSIS — E119 Type 2 diabetes mellitus without complications: Secondary | ICD-10-CM | POA: Insufficient documentation

## 2020-05-26 DIAGNOSIS — G43909 Migraine, unspecified, not intractable, without status migrainosus: Secondary | ICD-10-CM | POA: Diagnosis not present

## 2020-05-26 DIAGNOSIS — Z952 Presence of prosthetic heart valve: Secondary | ICD-10-CM | POA: Insufficient documentation

## 2020-05-26 DIAGNOSIS — Z419 Encounter for procedure for purposes other than remedying health state, unspecified: Secondary | ICD-10-CM

## 2020-05-26 DIAGNOSIS — Z8249 Family history of ischemic heart disease and other diseases of the circulatory system: Secondary | ICD-10-CM | POA: Insufficient documentation

## 2020-05-26 DIAGNOSIS — W19XXXA Unspecified fall, initial encounter: Secondary | ICD-10-CM | POA: Diagnosis not present

## 2020-05-26 DIAGNOSIS — Z833 Family history of diabetes mellitus: Secondary | ICD-10-CM | POA: Diagnosis not present

## 2020-05-26 DIAGNOSIS — G5603 Carpal tunnel syndrome, bilateral upper limbs: Secondary | ICD-10-CM | POA: Diagnosis not present

## 2020-05-26 DIAGNOSIS — Z885 Allergy status to narcotic agent status: Secondary | ICD-10-CM | POA: Insufficient documentation

## 2020-05-26 DIAGNOSIS — I10 Essential (primary) hypertension: Secondary | ICD-10-CM | POA: Insufficient documentation

## 2020-05-26 HISTORY — PX: ORIF ELBOW FRACTURE: SHX5031

## 2020-05-26 LAB — GLUCOSE, CAPILLARY
Glucose-Capillary: 90 mg/dL (ref 70–99)
Glucose-Capillary: 83 mg/dL (ref 70–99)

## 2020-05-26 SURGERY — OPEN REDUCTION INTERNAL FIXATION (ORIF) ELBOW/OLECRANON FRACTURE
Anesthesia: General | Site: Elbow | Laterality: Left

## 2020-05-26 MED ORDER — ONDANSETRON HCL 4 MG/2ML IJ SOLN
INTRAMUSCULAR | Status: DC | PRN
  Administered 2020-05-26: 4 mg via INTRAVENOUS

## 2020-05-26 MED ORDER — SODIUM CHLORIDE 0.9 % IV SOLN: INTRAVENOUS | Status: DC

## 2020-05-26 MED ORDER — BUPIVACAINE HCL (PF) 0.25 % IJ SOLN
INTRAMUSCULAR | Status: AC
  Filled 2020-05-26: qty 30

## 2020-05-26 MED ORDER — FENTANYL CITRATE (PF) 100 MCG/2ML IJ SOLN
INTRAMUSCULAR | Status: AC
  Filled 2020-05-26: qty 2

## 2020-05-26 MED ORDER — METOCLOPRAMIDE HCL 5 MG/ML IJ SOLN: 5.0000 mg | Freq: Three times a day (TID) | INTRAMUSCULAR | Status: DC | PRN

## 2020-05-26 MED ORDER — CEFAZOLIN SODIUM-DEXTROSE 2-4 GM/100ML-% IV SOLN
2.0000 g | INTRAVENOUS | Status: AC
  Administered 2020-05-26: 2 g via INTRAVENOUS

## 2020-05-26 MED ORDER — ORAL CARE MOUTH RINSE: 15.0000 mL | Freq: Once | OROMUCOSAL | Status: DC

## 2020-05-26 MED ORDER — FAMOTIDINE 20 MG PO TABS: 20.0000 mg | ORAL_TABLET | Freq: Once | ORAL | Status: AC

## 2020-05-26 MED ORDER — ONDANSETRON HCL 4 MG/2ML IJ SOLN: 4.0000 mg | Freq: Four times a day (QID) | INTRAMUSCULAR | Status: DC | PRN

## 2020-05-26 MED ORDER — FENTANYL CITRATE (PF) 100 MCG/2ML IJ SOLN
INTRAMUSCULAR | Status: DC | PRN
Start: 2020-05-26 — End: 2020-05-27
  Administered 2020-05-26: 50 ug via INTRAVENOUS

## 2020-05-26 MED ORDER — BUPIVACAINE HCL 0.25 % IJ SOLN
INTRAMUSCULAR | Status: DC | PRN
  Administered 2020-05-26: 20 mL

## 2020-05-26 MED ORDER — PROPOFOL 10 MG/ML IV BOLUS
INTRAVENOUS | Status: AC
  Filled 2020-05-26: qty 20

## 2020-05-26 MED ORDER — ROPIVACAINE HCL 5 MG/ML IJ SOLN
INTRAMUSCULAR | Status: AC
  Filled 2020-05-26: qty 30

## 2020-05-26 MED ORDER — MIDAZOLAM HCL 2 MG/2ML IJ SOLN
INTRAMUSCULAR | Status: AC
  Administered 2020-05-26: 1 mg via INTRAVENOUS
  Filled 2020-05-26: qty 2

## 2020-05-26 MED ORDER — FENTANYL CITRATE (PF) 100 MCG/2ML IJ SOLN: 25.0000 ug | INTRAMUSCULAR | Status: DC | PRN

## 2020-05-26 MED ORDER — CHLORHEXIDINE GLUCONATE 0.12 % MT SOLN
OROMUCOSAL | Status: AC
  Filled 2020-05-26: qty 15

## 2020-05-26 MED ORDER — MIDAZOLAM HCL 2 MG/2ML IJ SOLN: 1.0000 mg | Freq: Once | INTRAMUSCULAR | Status: AC

## 2020-05-26 MED ORDER — LIDOCAINE HCL (PF) 1 % IJ SOLN
INTRAMUSCULAR | Status: AC
  Filled 2020-05-26: qty 5

## 2020-05-26 MED ORDER — PROPOFOL 10 MG/ML IV BOLUS
INTRAVENOUS | Status: DC | PRN
  Administered 2020-05-26: 140 mg via INTRAVENOUS

## 2020-05-26 MED ORDER — EPHEDRINE SULFATE 50 MG/ML IJ SOLN
INTRAMUSCULAR | Status: DC | PRN
  Administered 2020-05-26: 10 mg via INTRAVENOUS
  Administered 2020-05-26: 5 mg via INTRAVENOUS

## 2020-05-26 MED ORDER — CHLORHEXIDINE GLUCONATE 0.12 % MT SOLN: 15.0000 mL | Freq: Once | OROMUCOSAL | Status: DC

## 2020-05-26 MED ORDER — ONDANSETRON HCL 4 MG PO TABS: 4.0000 mg | ORAL_TABLET | Freq: Four times a day (QID) | ORAL | Status: DC | PRN

## 2020-05-26 MED ORDER — FENTANYL CITRATE (PF) 100 MCG/2ML IJ SOLN: 50.0000 ug | Freq: Once | INTRAMUSCULAR | Status: AC

## 2020-05-26 MED ORDER — POTASSIUM CHLORIDE IN NACL 20-0.9 MEQ/L-% IV SOLN
INTRAVENOUS | Status: DC
  Filled 2020-05-26 (×3): qty 1000

## 2020-05-26 MED ORDER — PROMETHAZINE HCL 25 MG/ML IJ SOLN: 6.2500 mg | INTRAMUSCULAR | Status: DC | PRN

## 2020-05-26 MED ORDER — FENTANYL CITRATE (PF) 100 MCG/2ML IJ SOLN
INTRAMUSCULAR | Status: AC
  Administered 2020-05-26: 50 ug via INTRAVENOUS
  Filled 2020-05-26: qty 2

## 2020-05-26 MED ORDER — FAMOTIDINE 20 MG PO TABS
ORAL_TABLET | ORAL | Status: AC
  Filled 2020-05-26: qty 1

## 2020-05-26 MED ORDER — SEVOFLURANE IN SOLN
RESPIRATORY_TRACT | Status: AC
  Filled 2020-05-26: qty 250

## 2020-05-26 MED ORDER — LIDOCAINE HCL (CARDIAC) PF 100 MG/5ML IV SOSY
PREFILLED_SYRINGE | INTRAVENOUS | Status: DC | PRN
  Administered 2020-05-26: 80 mg via INTRAVENOUS

## 2020-05-26 MED ORDER — MIDAZOLAM HCL 2 MG/2ML IJ SOLN
INTRAMUSCULAR | Status: AC
  Filled 2020-05-26: qty 2

## 2020-05-26 MED ORDER — METOCLOPRAMIDE HCL 10 MG PO TABS: 5.0000 mg | ORAL_TABLET | Freq: Three times a day (TID) | ORAL | Status: DC | PRN

## 2020-05-26 MED ORDER — DEXAMETHASONE SODIUM PHOSPHATE 10 MG/ML IJ SOLN
INTRAMUSCULAR | Status: DC | PRN
  Administered 2020-05-26: 8 mg via INTRAVENOUS

## 2020-05-26 MED ORDER — HYDROCODONE-ACETAMINOPHEN 5-325 MG PO TABS: 1.0000 | ORAL_TABLET | Freq: Four times a day (QID) | ORAL | 0 refills | Status: DC | PRN

## 2020-05-26 MED ORDER — CEFAZOLIN SODIUM-DEXTROSE 2-4 GM/100ML-% IV SOLN
INTRAVENOUS | Status: AC
  Filled 2020-05-26: qty 100

## 2020-05-26 MED ORDER — FAMOTIDINE 20 MG PO TABS
ORAL_TABLET | ORAL | Status: AC
  Administered 2020-05-26: 20 mg via ORAL
  Filled 2020-05-26: qty 1

## 2020-05-26 MED ORDER — HYDROCODONE-ACETAMINOPHEN 5-325 MG PO TABS: 1.0000 | ORAL_TABLET | ORAL | Status: DC | PRN

## 2020-05-26 SURGICAL SUPPLY — 63 items
APL PRP STRL LF DISP 70% ISPRP (MISCELLANEOUS) ×1
BIT DRILL 2.5X2.75 QC CALB (BIT) ×6 IMPLANT
BNDG CMPR STD VLCR NS LF 5.8X4 (GAUZE/BANDAGES/DRESSINGS) ×2
BNDG ELASTIC 4X5.8 VLCR NS LF (GAUZE/BANDAGES/DRESSINGS) ×6 IMPLANT
BNDG ESMARK 4X12 TAN STRL LF (GAUZE/BANDAGES/DRESSINGS) ×3 IMPLANT
CANISTER SUCT 1200ML W/VALVE (MISCELLANEOUS) ×3 IMPLANT
CHLORAPREP W/TINT 26 (MISCELLANEOUS) ×3 IMPLANT
CLOSURE WOUND 1/2 X4 (GAUZE/BANDAGES/DRESSINGS) ×1
COVER WAND RF STERILE (DRAPES) ×3 IMPLANT
CUFF TOURN 24 STER (MISCELLANEOUS) ×3 IMPLANT
CUFF TOURN SGL QUICK 18X4 (TOURNIQUET CUFF) ×3 IMPLANT
DRAPE 3/4 80X56 (DRAPES) ×3 IMPLANT
DRAPE C-ARM XRAY 36X54 (DRAPES) ×3 IMPLANT
ELECT CAUTERY BLADE 6.4 (BLADE) ×3 IMPLANT
ELECT REM PT RETURN 9FT ADLT (ELECTROSURGICAL) ×3
ELECTRODE REM PT RTRN 9FT ADLT (ELECTROSURGICAL) ×1 IMPLANT
GAUZE SPONGE 4X4 12PLY STRL (GAUZE/BANDAGES/DRESSINGS) ×3 IMPLANT
GAUZE XEROFORM 1X8 LF (GAUZE/BANDAGES/DRESSINGS) ×3 IMPLANT
GLOVE BIO SURGEON STRL SZ 6.5 (GLOVE) ×2 IMPLANT
GLOVE BIO SURGEON STRL SZ7.5 (GLOVE) ×6 IMPLANT
GLOVE BIO SURGEON STRL SZ8 (GLOVE) ×3 IMPLANT
GLOVE BIO SURGEONS STRL SZ 6.5 (GLOVE) ×1
GLOVE BIOGEL PI IND STRL 8 (GLOVE) ×1 IMPLANT
GLOVE BIOGEL PI INDICATOR 8 (GLOVE) ×2
GLOVE INDICATOR 8.0 STRL GRN (GLOVE) ×3 IMPLANT
GOWN SRG 2XL LVL 4 RGLN SLV (GOWNS) ×1 IMPLANT
GOWN STRL NON-REIN 2XL LVL4 (GOWNS) ×3
GOWN STRL REUS W/ TWL LRG LVL3 (GOWN DISPOSABLE) ×1 IMPLANT
GOWN STRL REUS W/TWL LRG LVL3 (GOWN DISPOSABLE) ×3
K-WIRE ACE 1.6X6 (WIRE) ×3
KIT TURNOVER KIT A (KITS) ×3 IMPLANT
KWIRE ACE 1.6X6 (WIRE) ×1 IMPLANT
NEEDLE FILTER BLUNT 18X 1/2SAF (NEEDLE) ×2
NEEDLE FILTER BLUNT 18X1 1/2 (NEEDLE) ×1 IMPLANT
NS IRRIG 500ML POUR BTL (IV SOLUTION) ×3 IMPLANT
PACK EXTREMITY (MISCELLANEOUS) ×3 IMPLANT
PAD ABD DERMACEA PRESS 5X9 (GAUZE/BANDAGES/DRESSINGS) IMPLANT
PAD CAST CTTN 4X4 STRL (SOFTGOODS) ×3 IMPLANT
PAD PREP 24X41 OB/GYN DISP (PERSONAL CARE ITEMS) ×3 IMPLANT
PADDING CAST COTTON 4X4 STRL (SOFTGOODS) ×9
PLATE OLECRANON SM (Plate) ×3 IMPLANT
SCALPEL PROTECTED #15 DISP (BLADE) ×6 IMPLANT
SCREW CORTICAL 3.5MM 22MM (Screw) ×3 IMPLANT
SCREW CORTICAL 3.5MM 26MM (Screw) ×3 IMPLANT
SCREW CORTICAL LOW PROF 3.5X20 (Screw) ×3 IMPLANT
SCREW LOCK CORT STAR 3.5X16 (Screw) ×3 IMPLANT
SCREW LOCK CORT STAR 3.5X26 (Screw) ×3 IMPLANT
SCREW LOW PROFILE 22MMX3.5MM (Screw) ×3 IMPLANT
SCREW LP 3.5X44 (Screw) ×3 IMPLANT
SLING ARM M TX990204 (SOFTGOODS) ×3 IMPLANT
SPLINT CAST 1 STEP 4X30 (MISCELLANEOUS) ×3 IMPLANT
SPLINT CAST 1 STEP 5X30 WHT (MISCELLANEOUS) IMPLANT
SPONGE LAP 18X18 RF (DISPOSABLE) ×3 IMPLANT
STAPLER SKIN PROX 35W (STAPLE) ×3 IMPLANT
STOCKINETTE BIAS CUT 6 980064 (GAUZE/BANDAGES/DRESSINGS) IMPLANT
STOCKINETTE IMPERVIOUS 9X36 MD (GAUZE/BANDAGES/DRESSINGS) ×3 IMPLANT
STRIP CLOSURE SKIN 1/2X4 (GAUZE/BANDAGES/DRESSINGS) ×2 IMPLANT
SUT ETHILON 3-0 FS-10 30 BLK (SUTURE) ×3
SUT VIC AB 0 CT2 27 (SUTURE) ×3 IMPLANT
SUT VIC AB 2-0 CT2 27 (SUTURE) ×3 IMPLANT
SUTURE EHLN 3-0 FS-10 30 BLK (SUTURE) ×1 IMPLANT
SYR 5ML LL (SYRINGE) ×3 IMPLANT
SYR BULB IRRIG 60ML STRL (SYRINGE) ×3 IMPLANT

## 2020-05-26 NOTE — Anesthesia Procedure Notes (Signed)
Procedure Name: LMA Insertion Date/Time: 05/26/2020 12:05 PM Performed by: Karoline Caldwell, CRNA Pre-anesthesia Checklist: Patient identified, Patient being monitored, Timeout performed, Emergency Drugs available and Suction available Patient Re-evaluated:Patient Re-evaluated prior to induction Oxygen Delivery Method: Circle system utilized Preoxygenation: Pre-oxygenation with 100% oxygen Induction Type: IV induction Ventilation: Mask ventilation without difficulty LMA: LMA inserted LMA Size: 3.5 Tube type: Oral Number of attempts: 1 Placement Confirmation: positive ETCO2 and breath sounds checked- equal and bilateral Tube secured with: Tape Dental Injury: Teeth and Oropharynx as per pre-operative assessment

## 2020-05-26 NOTE — Discharge Instructions (Addendum)
Orthopedic discharge instructions: Keep splint dry and intact. Keep hand elevated above heart level. Apply ice to affected area frequently. Take ibuprofen 600-800 mg TID with meals for 7-10 days, then as necessary. Take pain medication as prescribed or ES Tylenol when needed.  Return for follow-up in 10-14 days or as scheduled.   AMBULATORY SURGERY  DISCHARGE INSTRUCTIONS   1) The drugs that you were given will stay in your system until tomorrow so for the next 24 hours you should not:  A) Drive an automobile B) Make any legal decisions C) Drink any alcoholic beverage   2) You may resume regular meals tomorrow.  Today it is better to start with liquids and gradually work up to solid foods.  You may eat anything you prefer, but it is better to start with liquids, then soup and crackers, and gradually work up to solid foods.   3) Please notify your doctor immediately if you have any unusual bleeding, trouble breathing, redness and pain at the surgery site, drainage, fever, or pain not relieved by medication.    4) Additional Instructions:        Please contact your physician with any problems or Same Day Surgery at [phone], Monday through Friday 6 am to 4 pm, or [place] at [facility] number at [phone].    Interscalene Nerve Block (ISNB) Discharge Instructions   1.  For your surgery you have received an Interscalene Nerve Block. 2. Nerve Blocks affect many types of nerves, including nerves that control movement, pain and normal sensation.  You may experience feelings such as numbness, tingling, heaviness, weakness or the inability to move your arm or the feeling or sensation that your arm has "fallen asleep". 3. A nerve block can last for 2 - 36 hours or more depending on the medication used.  Usually the weakness wears off first.  The tingling and heaviness usually wear off next.  Finally you may start to notice pain.  Keep in mind that this may occur in  any order.  once a nerve block starts to wear off it is usually completely gone within 60 minutes. 4. ISNB may cause mild shortness of breath, a hoarse voice, blurry vision, unequal pupils, or drooping of the face on the same side as the nerve block.  These symptoms will usually go away within 12 hours.  Very rarely the procedure itself can cause mild seizures. 5. If needed, your surgeon will give you a prescription for pain medication.  It will take about 60 minutes for the oral pain medication to become fully effective.  So, it is recommended that you start taking this medication before the nerve block first begins to wear off, or when you first begin to feel discomfort. 6. Keep in mind that nerve blocks often wear off in the middle of the night.   If you are going to bed and the block has not started to wear off or you have not started to have any discomfort, consider setting an alarm for 2 to 3 hours, so you can assess your block.  If you notice the block is wearing off or you are starting to have discomfort, you can take your pain medication. 7. Take your pain medication only as prescribed.  Pain medication can cause sedation and decrease your breathing if you take more than you need for the level of pain that you have. 8. Nausea is a common side effect of many pain medications.  You may want to eat something  before taking your pain medicine to prevent nausea. 9. After an Interscalene nerve block, you cannot feel pain, pressure or extremes in temperature in the effected arm.  Because your arm is numb it is at an increased risk for injury.  To decrease the possibility of injury, please practice the following:  a. While you are awake change the position of your arm frequently to prevent too much pressure on any one area for prolonged periods of time. b.  If you have a cast or tight dressing, check the color or your fingers every couple of hours.  Call your surgeon with the appearance of any discoloration  (white or blue). c. If you are given a sling to wear before you go home, please wear it  at all times until the block has completely worn off.  Do not get up at night without your sling. d. If you experience any problems or concerns, please contact your surgeon's office. e. If you experience severe or prolonged shortness of breath go to the nearest emergency department.

## 2020-05-26 NOTE — Transfer of Care (Signed)
Immediate Anesthesia Transfer of Care Note  Patient: Deborah Bartlett  Procedure(s) Performed: OPEN REDUCTION INTERNAL FIXATION (ORIF) ELBOW/OLECRANON FRACTURE (Left Elbow)  Patient Location: PACU  Anesthesia Type:General  Level of Consciousness: awake and sedated  Airway & Oxygen Therapy: Patient Spontanous Breathing and Patient connected to face mask oxygen  Post-op Assessment: Report given to RN and Post -op Vital signs reviewed and stable  Post vital signs: Reviewed and stable  Last Vitals:  Vitals Value Taken Time  BP 148/91 05/26/20 1403  Temp    Pulse 68 05/26/20 1405  Resp 11 05/26/20 1405  SpO2 100 % 05/26/20 1405  Vitals shown include unvalidated device data.  Last Pain:  Vitals:   05/26/20 1153  TempSrc:   PainSc: 0-No pain      Patients Stated Pain Goal: 0 (05/26/20 1029)  Complications: No complications documented.

## 2020-05-26 NOTE — H&P (Signed)
History of Present Illness: Deborah Bartlett is a 59 y.o. female who presents today for evaluation of a left elbow injury. The patient suffered a fall on 05/23/2020. The patient was at a [employer] game when she tripped and fell over a curb and landed directly on her left elbow. The patient had immediate pain and swelling, there is a medical provider near her who was able to fashion a shoulder sling for her which she began to wear. The patient did not wish to go to the hospital while in [place] so upon returning home Sunday night she came to the walk-in clinic this morning on 05/25/2020. At the walk-in clinic x-rays of the left elbow demonstrated a displaced left olecranon fracture. The patient was placed in a posterior splint and instructed to follow-up orthopedics. Patient presents today with 6 out of 10 pain to left upper extremity. She is left-hand dominant. No surgical history to left elbow. The patient denies any numbness or tingling to the left upper extremity or open wound at the time of the injury. She denies any personal history of heart attack, stroke, asthma or COPD. No personal history of blood clots.  Past Medical History: . Allergic state  . Anemia  . Bilateral carpal tunnel syndrome  . Complete tear of right rotator cuff 02/02/2018  . Congenital heart defect  . Diabetes type 2, controlled (CMS-HCC)  . Endometriosis  . GERD (gastroesophageal reflux disease)  . Hyperlipidemia  . Hypertension  . Migraine headache  . Scimitar syndrome   Past Surgical History: . 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 CARPAL TUNNEL RELEASE December 2015  . CESAREAN SECTION  . 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 heart surgery  fro birth defect  . LIMITED ARTHROSCOPIC DEBRIDEMENT,ARTHROSCOPIC SUBACROMIAL DECOMPRESSION,MINI-OPEN ROTATOR CUFF REPAIR, AND MINI-OPEN BICEPS TENODESIS,RIGHT SHOULDER Right 02/21/2018  DR.Kessler Kopinski   . OTHER SURGERY 2007  Repair cardiac defect  . TUBAL LIGATION   Past Family History: . Hyperlipidemia (Elevated cholesterol) Mother  . High blood pressure (Hypertension) Mother  . Diabetes Mother  . Diabetes type II Maternal Grandmother  . Hyperlipidemia (Elevated cholesterol) Maternal Grandmother  . High blood pressure (Hypertension) Maternal Grandmother  . Diabetes type II Maternal Grandfather  . Hyperlipidemia (Elevated cholesterol) Maternal Grandfather  . High blood pressure (Hypertension) Maternal Grandfather  . Stroke Father  . High blood pressure (Hypertension) Father   Medications: . amLODIPine (NORVASC) 5 MG tablet Take 5 mg by mouth 2 (two) times daily 1  . aspirin 81 MG chewable tablet Take 81 mg by mouth once daily.  . cyanocobalamin, vitamin B-12, (VITAMIN B-12 ORAL) Take 1 tablet by mouth once daily  . ergocalciferol, vitamin D2, (VITAMIN D2 ORAL) Take 1 tablet by mouth once daily  . HYDROcodone-acetaminophen (NORCO) 5-325 mg tablet Take one tablet at night for pain; may take up to every 6 hours as needed for pain if not working or driving 20 tablet 0  . losartan (COZAAR) 100 MG tablet Take 100 mg by mouth once daily  . magnesium oxide (MAG-OX) 400 mg (241.3 mg magnesium) tablet Take 1 tablet by mouth nightly  . metoprolol succinate (TOPROL-XL) 50 MG XL tablet Take 50 mg by mouth once daily  . naproxen sodium (ALEVE, ANAPROX) 220 MG tablet Take 440 mg by mouth as needed  . omeprazole (PRILOSEC) 20 MG DR capsule Take 20 mg by mouth daily.  . rosuvastatin (CRESTOR) 40 MG tablet  Take 40 mg by mouth once daily  . VESICARE 5 mg tablet Take 5 mg by mouth once daily 0  . gabapentin (NEURONTIN) 300 MG capsule Take 1 capsule (300 mg total) by mouth nightly for 30 days 30 capsule 6   Allergies: . Codeine Palpitations and Other (See Comments)  dizzy Heart races, feels like bugs crawling  Review of Systems:  A comprehensive 14 point ROS was performed, reviewed by me today,  and the pertinent orthopaedic findings are documented in the HPI.  Physical Exam: BP (!) 142/92  Ht 152.4 cm (5')  Wt 71.7 kg (158 lb)  LMP 08/26/2012  BMI 30.86 kg/m  General/Constitutional: The patient appears to be well-nourished, well-developed, and in no acute distress. Neuro/Psych: Normal mood and affect, oriented to person, place and time. Eyes: Non-icteric. Pupils are equal, round, and reactive to light, and exhibit synchronous movement. ENT: Unremarkable. Lymphatic: No palpable adenopathy. Respiratory: Lungs clear to auscultation, Normal chest excursion, No wheezes and Non-labored breathing Cardiovascular: Regular rate and rhythm. No murmurs. and No edema, swelling or tenderness, except as noted in detailed exam. Integumentary: No impressive skin lesions present, except as noted in detailed exam. Musculoskeletal: Unremarkable, except as noted in detailed exam.  The patient presents today wearing a posterior splint to the left upper extremity. The patient is wearing a sling to the left upper extremity today's visit. The patient is intact light touch to the left hand along the dorsal and volar aspect. The patient is able to extend and flex at the left wrist without any significant discomfort. The patient is able to make a full composite fist with mild discomfort. Grip strength is 4+/5 at today's visit. The patient has intact cap refill to each individual finger. The patient is able to give a thumbs up and okay sign. The patient is intact light touch along the more proximal aspect of the splint as well. Splint is in good position without evidence of loosening.  Imaging: X-rays of the right elbow were obtained today at the walk-in clinic. These x-rays demonstrate evidence of a displaced left olecranon fracture. Fracture gap is approximately 12 mm. No other acute fractures visualized. No lytic lesions noted. Moderate soft tissue elbow swelling.  Impression: Closed fracture of olecranon  process of left ulna.  Plan:  1. Treatment options were discussed today with the patient. 2. Instructed the patient that she does have a displaced left olecranon fracture that does require surgical intervention. 3. The patient will be scheduled for a open reduction and internal fixation of displaced left olecranon fracture with Dr. Joice Lofts on 05/26/2020. 4. The patient was instructed on the risk and benefits of surgery and wishes to proceed at this time. 5. This document will serve as a surgical history and physical for the patient. 6. The patient will follow-up per standard postop protocol. They can call the clinic they have any questions, new symptoms develop or symptoms worsen.  The procedure was discussed with the patient, as were the potential risks (including bleeding, infection, nerve and/or blood vessel injury, persistent or recurrent pain, failure of the hardware, need for hardware removal, progression of arthritis, need for further surgery, blood clots, strokes, heart attacks and/or arhythmias, pneumonia, etc.) and benefits. The patient states her understanding and wishes to proceed.    H&P reviewed and patient re-examined. No changes.

## 2020-05-26 NOTE — Op Note (Signed)
05/26/2020  2:02 PM  Patient:   Deborah Bartlett  Pre-Op Diagnosis:   Closed displaced olecranon fracture, left elbow.  Post-Op Diagnosis:   Same  Procedure:   Open reduction and internal fixation of displaced left olecranon fracture.  Surgeon:   Maryagnes Amos, MD  Assistant:   Myriam Jacobson, PA-S  Anesthesia:   General LMA  Findings:   As above.  Complications:   None  Fluids:   650 cc crystalloid  EBL:   15 cc  UOP:   None  TT:   72 minutes at 250 mmHg  Drains:   None  Closure:   Staples  Implants:   Short [employer] precontoured olecranon plate  Brief Clinical Note:   The patient is a 59 year old female who sustained above-noted injury several days ago when she fell while at a baseball game in [place].  She did not seek treatment immediately, but did present to [facility] urgent care clinic yesterday morning where x-rays demonstrated the above-noted injury.  The patient presents at this time for definitive management of this injury.  Procedure:   The patient underwent the placement of a supraclavicular block in the preoperative holding area by the anesthesiologist before being brought into the operating room and laid in the supine position.  After adequate general laryngeal mask anesthesia were obtained, the patient was rolled into the right lateral decubitus position and secured using a short beanbag.  Care was taken to be sure that all bony prominences were padded and that an axillary roll was utilized.  The left upper extremity was prepped with ChloraPrep solution before being draped sterilely.  Preoperative antibiotics were administered.  A timeout was performed to verify the appropriate surgical site before the limb was exsanguinated with an Esmarch and the tourniquet inflated to 250 mmHg.  An approximately 10 to 12 cm curvilinear incision was made over the posterior aspect of the elbow, curving around the posterior aspect of the olecranon.  The incision was carried  down through the subcutaneous tissues to expose the fracture site.  The abundant fracture hematoma was debrided using pick-ups, curettes, rongeurs, and irrigation.  Once the fracture was debrided thoroughly, the fracture was reduced and temporarily secured using bone tenaculums.    After verifying the adequacy reduction fluoroscopically in AP and lateral projections, the short [employer] precontoured olecranon plate was selected.  Minor additional contouring was performed before the plate was applied to the posterior aspect of the elbow and secured using a K wire distally and a locking "homerun screw" proximally.  The adequacy of hardware position was verified and found to be satisfactory.  A bicortical screw was placed through the slotted hole distally to apply compression across the fracture and to secure the plate to the ulnar shaft.  Proximally, one additional locking screw was placed through the bone fragment, while two additional bicortical screws and one locking screw were placed distally to stabilize the fracture.  Again the adequacy of fracture reduction and hardware position was verified fluoroscopically in AP and lateral projections and found to be excellent.  The one compression screw was thought to be a touch too long, so it was replaced with a shorter bicortical screw.  Again the adequacy of fracture reduction and hardware position was verified fluoroscopically in the AP and lateral projections and found to be excellent.    The wound was copiously irrigated with bacitracin saline solution with bulb irrigation before the deeper fascial layers were reapproximated using 2-0 Vicryl interrupted sutures.  The subcutaneous tissues were closed using 2-0 Vicryl interrupted sutures before the skin was closed with skin staples.  A total of 20 cc of 0.25% Sensorcaine with epinephrine was injected in and around the incision to help with postoperative analgesia.  A sterile bulky dressing was applied to the  wound before the left upper extremity was placed into a posterior splint maintaining the elbow at approximately 80 degrees of flexion.  The patient was rolled back into the supine position onto her hospital bed before being awakened, extubated, and returned to the recovery room in satisfactory condition after tolerating the procedure well.

## 2020-05-26 NOTE — Anesthesia Preprocedure Evaluation (Addendum)
Anesthesia Evaluation  Patient identified by MRN, date of birth, ID band Patient awake    Reviewed: Allergy & Precautions, H&P , NPO status , Patient's Chart, lab work & pertinent test results, reviewed documented beta blocker date and time   History of Anesthesia Complications Negative for: history of anesthetic complications  Airway Mallampati: II  TM Distance: >3 FB Neck ROM: full    Dental  (+) Dental Advidsory Given   Pulmonary neg pulmonary ROS,    Pulmonary exam normal breath sounds clear to auscultation       Cardiovascular Exercise Tolerance: Good hypertension, (-) angina(-) Past MI and (-) Cardiac Stents Normal cardiovascular exam(-) dysrhythmias (-) Valvular Problems/Murmurs Rhythm:regular Rate:Normal     Neuro/Psych  Headaches, neg Seizures negative psych ROS   GI/Hepatic Neg liver ROS, GERD  ,  Endo/Other  diabetes  Renal/GU negative Renal ROS  negative genitourinary   Musculoskeletal   Abdominal   Peds  Hematology negative hematology ROS (+)   Anesthesia Other Findings Past Medical History: No date: Diabetes mellitus without complication (HCC) No date: GERD (gastroesophageal reflux disease) No date: Hypercholesteremia No date: Hypertension No date: Migraine     Comment:  irregular, rare 2014: Perimenopausal   Reproductive/Obstetrics negative OB ROS                             Anesthesia Physical Anesthesia Plan  ASA: III  Anesthesia Plan: General   Post-op Pain Management:  Regional for Post-op pain   Induction: Intravenous  PONV Risk Score and Plan: 3 and Ondansetron, Dexamethasone, Midazolam, Promethazine and Treatment may vary due to age or medical condition  Airway Management Planned: LMA  Additional Equipment:   Intra-op Plan:   Post-operative Plan: Extubation in OR  Informed Consent: I have reviewed the patients History and Physical, chart, labs and  discussed the procedure including the risks, benefits and alternatives for the proposed anesthesia with the patient or authorized representative who has indicated his/her understanding and acceptance.     Dental Advisory Given  Plan Discussed with: Anesthesiologist, CRNA and Surgeon  Anesthesia Plan Comments:        Anesthesia Quick Evaluation

## 2020-05-27 ENCOUNTER — Encounter: Payer: Self-pay | Admitting: Surgery

## 2020-05-27 NOTE — Anesthesia Postprocedure Evaluation (Signed)
Anesthesia Post Note  Patient: Deborah Bartlett  Procedure(s) Performed: OPEN REDUCTION INTERNAL FIXATION (ORIF) ELBOW/OLECRANON FRACTURE (Left Elbow)  Patient location during evaluation: PACU Anesthesia Type: General Level of consciousness: awake and alert Pain management: pain level controlled Vital Signs Assessment: post-procedure vital signs reviewed and stable Respiratory status: spontaneous breathing, nonlabored ventilation, respiratory function stable and patient connected to nasal cannula oxygen Cardiovascular status: blood pressure returned to baseline and stable Postop Assessment: no apparent nausea or vomiting Anesthetic complications: no   No complications documented.   Last Vitals:  Vitals:   05/26/20 1506 05/26/20 1519  BP: 127/83 121/88  Pulse: 72 74  Resp: 18   Temp: (!) 36.3 C   SpO2: 96% 96%    Last Pain:  Vitals:   05/27/20 0919  TempSrc:   PainSc: 0-No pain                 Lenard Simmer

## 2020-06-16 ENCOUNTER — Encounter: Payer: BC Managed Care – PPO | Admitting: Internal Medicine

## 2020-08-20 ENCOUNTER — Other Ambulatory Visit: Payer: Self-pay | Admitting: Internal Medicine

## 2020-09-04 ENCOUNTER — Telehealth (INDEPENDENT_AMBULATORY_CARE_PROVIDER_SITE_OTHER): Payer: BC Managed Care – PPO | Admitting: Internal Medicine

## 2020-09-04 ENCOUNTER — Other Ambulatory Visit: Payer: Self-pay

## 2020-09-04 VITALS — Ht 60.0 in | Wt 155.0 lb

## 2020-09-04 DIAGNOSIS — E7800 Pure hypercholesterolemia, unspecified: Secondary | ICD-10-CM

## 2020-09-04 DIAGNOSIS — Z20822 Contact with and (suspected) exposure to covid-19: Secondary | ICD-10-CM

## 2020-09-04 DIAGNOSIS — Z1231 Encounter for screening mammogram for malignant neoplasm of breast: Secondary | ICD-10-CM | POA: Diagnosis not present

## 2020-09-04 DIAGNOSIS — K219 Gastro-esophageal reflux disease without esophagitis: Secondary | ICD-10-CM | POA: Diagnosis not present

## 2020-09-04 DIAGNOSIS — I10 Essential (primary) hypertension: Secondary | ICD-10-CM | POA: Diagnosis not present

## 2020-09-04 DIAGNOSIS — Z9109 Other allergy status, other than to drugs and biological substances: Secondary | ICD-10-CM

## 2020-09-04 DIAGNOSIS — Z9889 Other specified postprocedural states: Secondary | ICD-10-CM

## 2020-09-04 DIAGNOSIS — E279 Disorder of adrenal gland, unspecified: Secondary | ICD-10-CM

## 2020-09-04 DIAGNOSIS — M7989 Other specified soft tissue disorders: Secondary | ICD-10-CM

## 2020-09-04 DIAGNOSIS — E1165 Type 2 diabetes mellitus with hyperglycemia: Secondary | ICD-10-CM

## 2020-09-04 NOTE — Progress Notes (Signed)
Patient ID: Deborah Bartlett, female   DOB: Feb 25, 1961, 60 y.o.   MRN: [id number]   Virtual Visit via Telephone Note  This visit type was conducted due to national recommendations for restrictions regarding the COVID-19 pandemic (e.g. social distancing).  This format is felt to be most appropriate for this patient at this time.  All issues noted in this document were discussed and addressed.  No physical exam was performed (except for noted visual exam findings with Video Visits).   I connected with Deborah Bartlett by telephone and verified that I am speaking with the correct person using two identifiers. Location patient: home Location provider: work Persons participating in the telephone visit: patient, provider  The limitations, risks, security and privacy concerns of performing an evaluation and management service by telephone and the availability of in person appointments have been discussed.  It has also been discussed with the patient that there may be a patient responsible charge related to this service. The patient expressed understanding and agreed to proceed.   Reason for visit: follow up appt.   HPI: appt converted to telephone due to recent covid exposure.  [place].  08/23/20 - daughter-n-law diagnosed with covid.  She reports she is feeling good.  Reports some increased drainage with a tickle - contributing to some cough.  This has been intermittent for one month.  No chest pain or sob.  No headache or fever reported.  Taking otc allergy medication.  Had covid last year.  Eating.  No nausea or vomiting reported.  Going to PT - s/p ORIF - elbow.  Can lift no more than 10 pounds.  Has f/u with Dr Roland Rack 10/05/20.  Wearing compression hose.  Better in am.  Compression hose help.  Blood pressures averaging 145/83-88.  Taking her medication regularly.  Discussed elevation and changing medication.  Over due mammogram.    ROS: See pertinent positives and negatives per  HPI.  Past Medical History:  Diagnosis Date  . Diabetes mellitus without complication ([place])   . GERD (gastroesophageal reflux disease)   . Hypercholesteremia   . Hypertension   . Migraine    irregular, rare  . Perimenopausal 2014    Past Surgical History:  Procedure Laterality Date  . 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 [place]  . COLONOSCOPY  2014   Dr Bary Castilla  . ORIF ELBOW FRACTURE Left 05/26/2020   Procedure: OPEN REDUCTION INTERNAL FIXATION (ORIF) ELBOW/OLECRANON FRACTURE;  Surgeon: Corky Mull, MD;  Location: [facility];  Service: Orthopedics;  Laterality: Left;  . SHOULDER ARTHROSCOPY WITH ROTATOR CUFF REPAIR Right 02/21/2018   Procedure: SHOULDER ARTHROSCOPY WITH DEBRIDEMENT DECOMPRESSION AND REPAIR OF ROTATOR CUFF TEAR;  Surgeon: Corky Mull, MD;  Location: [facility] [facility];  Service: Orthopedics;  Laterality: Right;  Diabetic - diet controlled    Family History  Problem Relation Age of Onset  . Hypertension Mother   . Diabetes Mother   . Diabetes Maternal Grandmother   . Diabetes Maternal Grandfather   . Breast cancer Neg Hx   . Colon cancer Neg Hx     SOCIAL HX: reviewed.    Current Outpatient Medications:  .  acetaminophen (TYLENOL) 500 MG tablet, Take 500 mg by mouth every 6 (six) hours as needed for mild pain., Disp: , Rfl:  .  amLODipine (NORVASC) 5 MG tablet, Take 1 tablet (5 mg total) by mouth 2 (two) times daily.,  Disp: 180 tablet, Rfl: 1 .  aspirin 81 MG tablet, Take 81 mg by mouth daily., Disp: , Rfl:  .  cetirizine (ZYRTEC) 10 MG tablet, Take 10 mg by mouth daily., Disp: , Rfl:  .  ferrous sulfate 325 (65 FE) MG tablet, Take 325 mg by mouth daily with breakfast., Disp: , Rfl:  .  gabapentin (NEURONTIN) 300 MG capsule, Take 300 mg by mouth at bedtime., Disp: , Rfl:  .  Lancets (ONETOUCH ULTRASOFT) lancets, Use as instructed to check Blood sugars 3-4 times a week, twice a day.  Dispense One Touch  brand. E11.9, Disp: 100 each, Rfl: 12 .  losartan (COZAAR) 100 MG tablet, TAKE 1 TABLET BY MOUTH EVERY DAY (Patient taking differently: Take 100 mg by mouth daily.), Disp: 90 tablet, Rfl: 1 .  Magnesium 250 MG TABS, Take 250 mg by mouth at bedtime., Disp: , Rfl:  .  metoprolol succinate (TOPROL-XL) 50 MG 24 hr tablet, TAKE 1 TABLET (50 MG TOTAL) BY MOUTH DAILY. TAKE WITH OR IMMEDIATELY FOLLOWING A MEAL., Disp: 90 tablet, Rfl: 1 .  naproxen sodium (ALEVE) 220 MG tablet, Take 440 mg by mouth 2 (two) times daily., Disp: , Rfl:  .  omeprazole (PRILOSEC) 20 MG capsule, Take 20 mg by mouth daily., Disp: , Rfl:  .  ONETOUCH VERIO test strip, CHECK BLOOD SUGAR TWICE A DAY 3 TO 4 TIMES A WEEK, Disp: 100 strip, Rfl: 12 .  rosuvastatin (CRESTOR) 40 MG tablet, Take 1 tablet (40 mg total) by mouth daily., Disp: 90 tablet, Rfl: 1 .  solifenacin (VESICARE) 5 MG tablet, TAKE 1 TABLET BY MOUTH EVERY DAY, Disp: 90 tablet, Rfl: 2 .  vitamin B-12 (CYANOCOBALAMIN) 1000 MCG tablet, Take 1,000 mcg by mouth daily., Disp: , Rfl:  .  Vitamin D3 (VITAMIN D) 25 MCG tablet, Take 1,000 Units by mouth daily., Disp: , Rfl:   EXAM:  GENERAL: alert. Sounds to be in no acute distress.  Answering questions appropriately.    PSYCH/NEURO: pleasant and cooperative, no obvious depression or anxiety, speech and thought processing grossly intact  ASSESSMENT AND PLAN:  Discussed the following assessment and plan:  Problem List Items Addressed This Visit    Diabetes mellitus ([place])    Low carb diet and exercise.  Follow met b and a1c.        Relevant Orders   Hemoglobin B8M   Basic metabolic panel   Environmental allergies    Takes otc allergy medication.  Discussed using steroid nasal spray.  Follow.       Essential hypertension, benign    Blood pressures as outlined.  On metoprolol, losartan and amlodipine.  Pressures elevated.  Discussed changing medication.  She will check and record her blood pressure and pulse rate and  send in readings over the next 2-3 weeks.  Discussed possibly changing metoprolol to coreg for better blood pressure control.  Follow pressures.  Follow metabolic panel.       Relevant Orders   CBC with Differential/Platelet   Exposure to COVID-19 virus    Daughter-n-law diagnosed as outlined.  She reports increased drainage and minimal cough from drainage.  Feels good.  Continues otc antihistamine.  Can use steroid nasal spray.  Discussed covid testing.  She defers.  Follow.  Discussed quarantine.       GERD (gastroesophageal reflux disease)    No upper symptoms reported. On omeprazole.       History of elbow surgery    Going to PT.  Has  f/u with Dr Roland Rack 10/05/20.       Hypercholesterolemia    Low cholesterol diet and exercise.  Follow lipid panel.       Relevant Orders   Hepatic function panel   Lipid panel   Lesion of adrenal gland ([place])    C/w adrenal adenoma.  Saw Dr Honor Junes.  W/up unrevealing.  Stable.  Recommended f/u in one year.        Swelling of lower extremity    Overall stable.  Continue compression hose.  Follow.         Other Visit Diagnoses    Encounter for screening mammogram for malignant neoplasm of breast    -  Primary   Relevant Orders   MM 3D SCREEN BREAST BILATERAL       I discussed the assessment and treatment plan with the patient. The patient was provided an opportunity to ask questions and all were answered. The patient agreed with the plan and demonstrated an understanding of the instructions.   The patient was advised to call back or seek an in-person evaluation if the symptoms worsen or if the condition fails to improve as anticipated.  I provided 25 minutes of non-face-to-face time during this encounter.   Einar Pheasant, MD

## 2020-09-05 ENCOUNTER — Encounter: Payer: Self-pay | Admitting: Internal Medicine

## 2020-09-05 ENCOUNTER — Telehealth: Payer: Self-pay | Admitting: Internal Medicine

## 2020-09-05 DIAGNOSIS — Z9889 Other specified postprocedural states: Secondary | ICD-10-CM | POA: Insufficient documentation

## 2020-09-05 DIAGNOSIS — Z20822 Contact with and (suspected) exposure to covid-19: Secondary | ICD-10-CM | POA: Insufficient documentation

## 2020-09-05 NOTE — Assessment & Plan Note (Signed)
C/w adrenal adenoma.  Saw Dr Gershon Crane.  W/up unrevealing.  Stable.  Recommended f/u in one year.

## 2020-09-05 NOTE — Assessment & Plan Note (Signed)
No upper symptoms reported.  On omeprazole.  

## 2020-09-05 NOTE — Assessment & Plan Note (Signed)
Daughter-n-law diagnosed as outlined.  She reports increased drainage and minimal cough from drainage.  Feels good.  Continues otc antihistamine.  Can use steroid nasal spray.  Discussed covid testing.  She defers.  Follow.  Discussed quarantine.

## 2020-09-05 NOTE — Assessment & Plan Note (Signed)
Low cholesterol diet and exercise.  Follow lipid panel.   

## 2020-09-05 NOTE — Assessment & Plan Note (Signed)
Blood pressures as outlined.  On metoprolol, losartan and amlodipine.  Pressures elevated.  Discussed changing medication.  She will check and record her blood pressure and pulse rate and send in readings over the next 2-3 weeks.  Discussed possibly changing metoprolol to coreg for better blood pressure control.  Follow pressures.  Follow metabolic panel.

## 2020-09-05 NOTE — Assessment & Plan Note (Signed)
Takes otc allergy medication.  Discussed using steroid nasal spray.  Follow.

## 2020-09-05 NOTE — Telephone Encounter (Signed)
She wanted [place] to schedule her mammogram.  I have placed the order.  She prefers after 2:30 or 3:00.  Thanks.

## 2020-09-05 NOTE — Assessment & Plan Note (Signed)
Low carb diet and exercise.  Follow met b and a1c.   

## 2020-09-05 NOTE — Assessment & Plan Note (Signed)
Going to PT.  Has f/u with Dr Joice Lofts 10/05/20.

## 2020-09-05 NOTE — Assessment & Plan Note (Signed)
Overall stable.  Continue compression hose.  Follow.

## 2020-09-09 NOTE — Telephone Encounter (Signed)
LM for [place] to call and schedule patient.

## 2020-09-17 ENCOUNTER — Encounter: Payer: Self-pay | Admitting: Internal Medicine

## 2020-09-23 ENCOUNTER — Encounter: Payer: Self-pay | Admitting: Internal Medicine

## 2020-09-24 NOTE — Telephone Encounter (Signed)
Blood pressures are running higher than I would like.  Confirm she is on losartan 100mg  q day, amlodipine 5mg  bid and Toprol XL 50mg  q day.  If this is correct, I would like to stop toprol and start coreg 6.25mg  bid.  Will need to follow pressures and may need to tritrate up the dose.  Send in readings.

## 2020-09-25 NOTE — Telephone Encounter (Signed)
LMTCB

## 2020-09-27 ENCOUNTER — Other Ambulatory Visit: Payer: Self-pay | Admitting: Internal Medicine

## 2020-09-28 NOTE — Telephone Encounter (Signed)
LMTCB

## 2020-10-02 ENCOUNTER — Other Ambulatory Visit: Payer: Self-pay

## 2020-10-02 MED ORDER — CARVEDILOL 6.25 MG PO TABS: 6.2500 mg | ORAL_TABLET | Freq: Two times a day (BID) | ORAL | 1 refills | Status: DC

## 2020-10-12 ENCOUNTER — Other Ambulatory Visit: Payer: BC Managed Care – PPO

## 2020-10-14 ENCOUNTER — Other Ambulatory Visit: Payer: Self-pay | Admitting: Internal Medicine

## 2020-10-25 ENCOUNTER — Other Ambulatory Visit: Payer: Self-pay | Admitting: Internal Medicine

## 2020-11-02 ENCOUNTER — Encounter: Payer: Self-pay | Admitting: Radiology

## 2020-11-02 ENCOUNTER — Other Ambulatory Visit: Payer: Self-pay

## 2020-11-02 ENCOUNTER — Emergency Department: Payer: BC Managed Care – PPO

## 2020-11-02 ENCOUNTER — Emergency Department
Admission: EM | Admit: 2020-11-02 | Discharge: 2020-11-03 | Disposition: A | Discharge status: Home / Self Care | Payer: BC Managed Care – PPO | Attending: Emergency Medicine | Admitting: Emergency Medicine

## 2020-11-02 DIAGNOSIS — E1169 Type 2 diabetes mellitus with other specified complication: Secondary | ICD-10-CM | POA: Diagnosis not present

## 2020-11-02 DIAGNOSIS — S30811A Abrasion of abdominal wall, initial encounter: Secondary | ICD-10-CM | POA: Insufficient documentation

## 2020-11-02 DIAGNOSIS — Z79899 Other long term (current) drug therapy: Secondary | ICD-10-CM | POA: Insufficient documentation

## 2020-11-02 DIAGNOSIS — Z7982 Long term (current) use of aspirin: Secondary | ICD-10-CM | POA: Insufficient documentation

## 2020-11-02 DIAGNOSIS — Y92410 Unspecified street and highway as the place of occurrence of the external cause: Secondary | ICD-10-CM | POA: Insufficient documentation

## 2020-11-02 DIAGNOSIS — Z23 Encounter for immunization: Secondary | ICD-10-CM | POA: Diagnosis not present

## 2020-11-02 DIAGNOSIS — M7989 Other specified soft tissue disorders: Secondary | ICD-10-CM | POA: Diagnosis not present

## 2020-11-02 DIAGNOSIS — E785 Hyperlipidemia, unspecified: Secondary | ICD-10-CM | POA: Diagnosis not present

## 2020-11-02 DIAGNOSIS — I10 Essential (primary) hypertension: Secondary | ICD-10-CM | POA: Insufficient documentation

## 2020-11-02 DIAGNOSIS — S3991XA Unspecified injury of abdomen, initial encounter: Secondary | ICD-10-CM | POA: Diagnosis present

## 2020-11-02 LAB — COMPREHENSIVE METABOLIC PANEL
ALT: 21 U/L (ref 0–44)
AST: 21 U/L (ref 15–41)
Albumin: 4.8 g/dL (ref 3.5–5.0)
Alkaline Phosphatase: 127 U/L — ABNORMAL HIGH (ref 38–126)
Anion gap: 9 (ref 5–15)
BUN: 23 mg/dL — ABNORMAL HIGH (ref 6–20)
CO2: 24 mmol/L (ref 22–32)
Calcium: 10.4 mg/dL — ABNORMAL HIGH (ref 8.9–10.3)
Chloride: 106 mmol/L (ref 98–111)
Creatinine, Ser: 1.04 mg/dL — ABNORMAL HIGH (ref 0.44–1.00)
GFR, Estimated: >60 mL/min (ref >60)
Glucose, Bld: 107 mg/dL — ABNORMAL HIGH (ref 70–99)
Potassium: 4.3 mmol/L (ref 3.5–5.1)
Sodium: 139 mmol/L (ref 135–145)
Total Bilirubin: 0.7 mg/dL (ref 0.3–1.2)
Total Protein: 8.4 g/dL — ABNORMAL HIGH (ref 6.5–8.1)

## 2020-11-02 LAB — CBC WITH DIFFERENTIAL/PLATELET
Abs Immature Granulocytes: 0.02 10*3/uL (ref 0.00–0.07)
Basophils Absolute: 0.1 10*3/uL (ref 0.0–0.1)
Basophils Relative: 1 %
Eosinophils Absolute: 0.2 10*3/uL (ref 0.0–0.5)
Eosinophils Relative: 3 %
HCT: 41.5 % (ref 36.0–46.0)
Hemoglobin: 14 g/dL (ref 12.0–15.0)
Immature Granulocytes: 0 %
Lymphocytes Relative: 25 %
Lymphs Abs: 1.7 10*3/uL (ref 0.7–4.0)
MCH: 29.5 pg (ref 26.0–34.0)
MCHC: 33.7 g/dL (ref 30.0–36.0)
MCV: 87.6 fL (ref 80.0–100.0)
Monocytes Absolute: 0.6 10*3/uL (ref 0.1–1.0)
Monocytes Relative: 9 %
Neutro Abs: 4.2 10*3/uL (ref 1.7–7.7)
Neutrophils Relative %: 62 %
Platelets: 298 10*3/uL (ref 150–400)
RBC: 4.74 MIL/uL (ref 3.87–5.11)
RDW: 12.8 % (ref 11.5–15.5)
WBC: 6.8 10*3/uL (ref 4.0–10.5)
nRBC: 0 % (ref 0.0–0.2)

## 2020-11-02 LAB — URINALYSIS, COMPLETE (UACMP) WITH MICROSCOPIC
Bilirubin Urine: NEGATIVE
Glucose, UA: NEGATIVE mg/dL
Hgb urine dipstick: NEGATIVE
Ketones, ur: NEGATIVE mg/dL
Leukocytes,Ua: NEGATIVE
Nitrite: NEGATIVE
Protein, ur: NEGATIVE mg/dL
Specific Gravity, Urine: 1.009 (ref 1.005–1.030)
pH: 7 (ref 5.0–8.0)

## 2020-11-02 LAB — APTT: aPTT: 35 seconds (ref 24–36)

## 2020-11-02 LAB — TYPE AND SCREEN
ABO/RH(D): O NEG
Antibody Screen: NEGATIVE

## 2020-11-02 LAB — PROTIME-INR
INR: 1 (ref 0.8–1.2)
Prothrombin Time: 12.7 seconds (ref 11.4–15.2)

## 2020-11-02 MED ORDER — ONDANSETRON HCL 4 MG/2ML IJ SOLN
4.0000 mg | Freq: Once | INTRAMUSCULAR | Status: AC
  Administered 2020-11-02: 4 mg via INTRAVENOUS
  Filled 2020-11-02: qty 2

## 2020-11-02 MED ORDER — IOHEXOL 300 MG/ML  SOLN
100.0000 mL | Freq: Once | INTRAMUSCULAR | Status: AC | PRN
  Administered 2020-11-02: 100 mL via INTRAVENOUS

## 2020-11-02 MED ORDER — TETANUS-DIPHTH-ACELL PERTUSSIS 5-2.5-18.5 LF-MCG/0.5 IM SUSY
0.5000 mL | PREFILLED_SYRINGE | Freq: Once | INTRAMUSCULAR | Status: AC
  Administered 2020-11-02: 0.5 mL via INTRAMUSCULAR
  Filled 2020-11-02: qty 0.5

## 2020-11-02 MED ORDER — FENTANYL CITRATE (PF) 100 MCG/2ML IJ SOLN
50.0000 ug | Freq: Once | INTRAMUSCULAR | Status: AC
  Administered 2020-11-02: 50 ug via INTRAVENOUS
  Filled 2020-11-02: qty 2
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
## 2020-11-13 ENCOUNTER — Ambulatory Visit: Payer: BC Managed Care – PPO | Admitting: Internal Medicine

## 2020-11-13 ENCOUNTER — Other Ambulatory Visit: Payer: Self-pay

## 2020-11-13 DIAGNOSIS — M7989 Other specified soft tissue disorders: Secondary | ICD-10-CM | POA: Diagnosis not present

## 2020-11-13 DIAGNOSIS — I10 Essential (primary) hypertension: Secondary | ICD-10-CM | POA: Diagnosis not present

## 2020-11-13 DIAGNOSIS — E7800 Pure hypercholesterolemia, unspecified: Secondary | ICD-10-CM

## 2020-11-13 DIAGNOSIS — E279 Disorder of adrenal gland, unspecified: Secondary | ICD-10-CM

## 2020-11-13 DIAGNOSIS — E1165 Type 2 diabetes mellitus with hyperglycemia: Secondary | ICD-10-CM
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
## 2020-11-15 ENCOUNTER — Encounter: Payer: Self-pay | Admitting: Internal Medicine

## 2020-11-15 NOTE — Assessment & Plan Note (Signed)
Low cholesterol diet and exercise.  Follow lipid panel.   

## 2020-11-15 NOTE — Assessment & Plan Note (Signed)
Evaluated in ER as outlined.  Scan/xray as outlined.  Some persistent left wrist pain as outlined, but improved.  Follow.  Notify me if persistent problems.

## 2020-11-15 NOTE — Assessment & Plan Note (Signed)
Continue compression hose/socks.  Stable.

## 2020-11-15 NOTE — Assessment & Plan Note (Signed)
Blood pressures better.  122/78 on my check today.  Continue coreg, losartan and amlodipine.  Follow pressures.  Follow metabolic panel.

## 2020-11-15 NOTE — Assessment & Plan Note (Signed)
Low carb diet and exercise.  Follow met b and a1c.  

## 2020-11-15 NOTE — Assessment & Plan Note (Signed)
C/w adrenal adenoma.  W/up unrevealing.  Saw Dr Gershon Crane.  Recommended f/u in one year.

## 2020-11-26 ENCOUNTER — Other Ambulatory Visit: Payer: Self-pay | Admitting: Internal Medicine

## 2020-12-02 ENCOUNTER — Other Ambulatory Visit: Payer: Self-pay

## 2020-12-02 ENCOUNTER — Other Ambulatory Visit (INDEPENDENT_AMBULATORY_CARE_PROVIDER_SITE_OTHER): Payer: BC Managed Care – PPO

## 2020-12-02 DIAGNOSIS — I10 Essential (primary) hypertension: Secondary | ICD-10-CM | POA: Diagnosis not present

## 2020-12-02 DIAGNOSIS — E7800 Pure hypercholesterolemia, unspecified: Secondary | ICD-10-CM

## 2020-12-02 DIAGNOSIS — E1165 Type 2 diabetes mellitus with hyperglycemia: Secondary | ICD-10-CM

## 2020-12-02 LAB — BASIC METABOLIC PANEL
BUN: 16 mg/dL (ref 6–23)
CO2: 30 mEq/L (ref 19–32)
Calcium: 10.3 mg/dL (ref 8.4–10.5)
Chloride: 105 mEq/L (ref 96–112)
Creatinine, Ser: 0.94 mg/dL (ref 0.40–1.20)
GFR: 66.86 mL/min (ref >60.00)
Glucose, Bld: 99 mg/dL (ref 70–99)
Potassium: 4.2 mEq/L (ref 3.5–5.1)
Sodium: 141 mEq/L (ref 135–145)

## 2020-12-02 LAB — CBC WITH DIFFERENTIAL/PLATELET
Basophils Absolute: 0 10*3/uL (ref 0.0–0.1)
Basophils Relative: 0.7 % (ref 0.0–3.0)
Eosinophils Absolute: 0.4 10*3/uL (ref 0.0–0.7)
Eosinophils Relative: 5.4 % — ABNORMAL HIGH (ref 0.0–5.0)
HCT: 37.8 % (ref 36.0–46.0)
Hemoglobin: 12.6 g/dL (ref 12.0–15.0)
Lymphocytes Relative: 27.4 % (ref 12.0–46.0)
Lymphs Abs: 1.8 10*3/uL (ref 0.7–4.0)
MCHC: 33.2 g/dL (ref 30.0–36.0)
MCV: 86.8 fl (ref 78.0–100.0)
Monocytes Absolute: 0.8 10*3/uL (ref 0.1–1.0)
Monocytes Relative: 12.5 % — ABNORMAL HIGH (ref 3.0–12.0)
Neutro Abs: 3.6 10*3/uL (ref 1.4–7.7)
Neutrophils Relative %: 54 % (ref 43.0–77.0)
Platelets: 270 10*3/uL (ref 150.0–400.0)
RBC: 4.36 Mil/uL (ref 3.87–5.11)
RDW: 13.6 % (ref 11.5–15.5)
WBC: 6.6 10*3/uL (ref 4.0–10.5)

## 2020-12-02 LAB — HEPATIC FUNCTION PANEL
ALT: 15 U/L (ref 0–35)
AST: 14 U/L (ref 0–37)
Albumin: 4.2 g/dL (ref 3.5–5.2)
Alkaline Phosphatase: 146 U/L — ABNORMAL HIGH (ref 39–117)
Bilirubin, Direct: 0.1 mg/dL (ref 0.0–0.3)
Total Bilirubin: 0.4 mg/dL (ref 0.2–1.2)
Total Protein: 7.2 g/dL (ref 6.0–8.3)

## 2020-12-02 LAB — LIPID PANEL
Cholesterol: 157 mg/dL (ref 0–200)
HDL: 44.4 mg/dL (ref >39.00)
LDL Cholesterol: 79 mg/dL (ref 0–99)
NonHDL: 112.45
Total CHOL/HDL Ratio: 4
Triglycerides: 169 mg/dL — ABNORMAL HIGH (ref 0.0–149.0)
VLDL: 33.8 mg/dL (ref 0.0–40.0)

## 2020-12-02 LAB — HEMOGLOBIN A1C: Hgb A1c MFr Bld: 6.4 % (ref 4.6–6.5)

## 2020-12-08 ENCOUNTER — Telehealth: Payer: Self-pay

## 2020-12-08 NOTE — Telephone Encounter (Signed)
Pt called back about lab results.  

## 2020-12-09 NOTE — Telephone Encounter (Signed)
See result note.  

## 2020-12-25 ENCOUNTER — Telehealth: Payer: Self-pay | Admitting: Internal Medicine

## 2020-12-25 NOTE — Telephone Encounter (Signed)
PT called to return phone call from earlier 

## 2020-12-29 ENCOUNTER — Other Ambulatory Visit: Payer: Self-pay | Admitting: Internal Medicine

## 2020-12-29 NOTE — Telephone Encounter (Signed)
LMTCB

## 2021-01-08 NOTE — Telephone Encounter (Signed)
Pt has viewed my chart for her labs

## 2021-01-12 ENCOUNTER — Encounter: Payer: Self-pay | Admitting: Internal Medicine

## 2021-01-13 ENCOUNTER — Telehealth (INDEPENDENT_AMBULATORY_CARE_PROVIDER_SITE_OTHER): Payer: BC Managed Care – PPO | Admitting: Internal Medicine

## 2021-01-13 ENCOUNTER — Other Ambulatory Visit: Payer: Self-pay

## 2021-01-13 ENCOUNTER — Encounter: Payer: Self-pay | Admitting: Internal Medicine

## 2021-01-13 VITALS — Ht 60.0 in | Wt 160.0 lb

## 2021-01-13 DIAGNOSIS — R059 Cough, unspecified: Secondary | ICD-10-CM | POA: Diagnosis not present

## 2021-01-13 DIAGNOSIS — J40 Bronchitis, not specified as acute or chronic: Secondary | ICD-10-CM

## 2021-01-13 DIAGNOSIS — J069 Acute upper respiratory infection, unspecified: Secondary | ICD-10-CM | POA: Diagnosis not present

## 2021-01-13 MED ORDER — ALBUTEROL SULFATE HFA 108 (90 BASE) MCG/ACT IN AERS
1.0000 | INHALATION_SPRAY | Freq: Four times a day (QID) | RESPIRATORY_TRACT | 0 refills | Status: DC | PRN
Start: 2021-01-13 — End: 2021-02-05

## 2021-01-13 MED ORDER — AZITHROMYCIN 250 MG PO TABS: ORAL_TABLET | ORAL | 0 refills | Status: AC

## 2021-01-13 MED ORDER — DM-GUAIFENESIN ER 60-1200 MG PO TB12: 1.0000 | ORAL_TABLET | Freq: Two times a day (BID) | ORAL | 0 refills | Status: DC

## 2021-01-13 NOTE — Patient Instructions (Signed)
Sinusitis, Adult Sinusitis is inflammation of your sinuses. Sinuses are hollow spaces in the bones around your face. Your sinuses are located:  Around your eyes.  In the middle of your forehead.  Behind your nose.  In your cheekbones. Mucus normally drains out of your sinuses. When your nasal tissues become inflamed or swollen, mucus can become trapped or blocked. This allows bacteria, viruses, and fungi to grow, which leads to infection. Most infections of the sinuses are caused by a virus. Sinusitis can develop quickly. It can last for up to 4 weeks (acute) or for more than 12 weeks (chronic). Sinusitis often develops after a cold. What are the causes? This condition is caused by anything that creates swelling in the sinuses or stops mucus from draining. This includes:  Allergies.  Asthma.  Infection from bacteria or viruses.  Deformities or blockages in your nose or sinuses.  Abnormal growths in the nose (nasal polyps).  Pollutants, such as chemicals or irritants in the air.  Infection from fungi (rare). What increases the risk? You are more likely to develop this condition if you:  Have a weak body defense system (immune system).  Do a lot of swimming or diving.  Overuse nasal sprays.  Smoke. What are the signs or symptoms? The main symptoms of this condition are pain and a feeling of pressure around the affected sinuses. Other symptoms include:  Stuffy nose or congestion.  Thick drainage from your nose.  Swelling and warmth over the affected sinuses.  Headache.  Upper toothache.  A cough that may get worse at night.  Extra mucus that collects in the throat or the back of the nose (postnasal drip).  Decreased sense of smell and taste.  Fatigue.  A fever.  Sore throat.  Bad breath. How is this diagnosed? This condition is diagnosed based on:  Your symptoms.  Your medical history.  A physical exam.  Tests to find out if your condition is  acute or chronic. This may include: ? Checking your nose for nasal polyps. ? Viewing your sinuses using a device that has a light (endoscope). ? Testing for allergies or bacteria. ? Imaging tests, such as an MRI or CT scan. In rare cases, a bone biopsy may be done to rule out more serious types of fungal sinus disease. How is this treated? Treatment for sinusitis depends on the cause and whether your condition is chronic or acute.  If caused by a virus, your symptoms should go away on their own within 10 days. You may be given medicines to relieve symptoms. They include: ? Medicines that shrink swollen nasal passages (topical intranasal decongestants). ? Medicines that treat allergies (antihistamines). ? A spray that eases inflammation of the nostrils (topical intranasal corticosteroids). ? Rinses that help get rid of thick mucus in your nose (nasal saline washes).  If caused by bacteria, your health care provider may recommend waiting to see if your symptoms improve. Most bacterial infections will get better without antibiotic medicine. You may be given antibiotics if you have: ? A severe infection. ? A weak immune system.  If caused by narrow nasal passages or nasal polyps, you may need to have surgery. Follow these instructions at home: Medicines  Take, use, or apply over-the-counter and prescription medicines only as told by your health care provider. These may include nasal sprays.  If you were prescribed an antibiotic medicine, take it as told by your health care provider. Do not stop taking the antibiotic even if you start   to feel better. Hydrate and humidify  Drink enough fluid to keep your urine pale yellow. Staying hydrated will help to thin your mucus.  Use a cool mist humidifier to keep the humidity level in your home above 50%.  Inhale steam for 10-15 minutes, 3-4 times a day, or as told by your health care provider. You can do this in the bathroom while a hot shower is  running.  Limit your exposure to cool or dry air.   Rest  Rest as much as possible.  Sleep with your head raised (elevated).  Make sure you get enough sleep each night. General instructions  Apply a warm, moist washcloth to your face 3-4 times a day or as told by your health care provider. This will help with discomfort.  Wash your hands often with soap and water to reduce your exposure to germs. If soap and water are not available, use hand sanitizer.  Do not smoke. Avoid being around people who are smoking (secondhand smoke).  Keep all follow-up visits as told by your health care provider. This is important.   Contact a health care provider if:  You have a fever.  Your symptoms get worse.  Your symptoms do not improve within 10 days. Get help right away if:  You have a severe headache.  You have persistent vomiting.  You have severe pain or swelling around your face or eyes.  You have vision problems.  You develop confusion.  Your neck is stiff.  You have trouble breathing. Summary  Sinusitis is soreness and inflammation of your sinuses. Sinuses are hollow spaces in the bones around your face.  This condition is caused by nasal tissues that become inflamed or swollen. The swelling traps or blocks the flow of mucus. This allows bacteria, viruses, and fungi to grow, which leads to infection.  If you were prescribed an antibiotic medicine, take it as told by your health care provider. Do not stop taking the antibiotic even if you start to feel better.  Keep all follow-up visits as told by your health care provider. This is important. This information is not intended to replace advice given to you by your health care provider. Make sure you discuss any questions you have with your health care provider. Document Revised: 01/08/2018 Document Reviewed: 01/08/2018 Elsevier Patient Education  2021 [employer]  Nosebleed, Adult A nosebleed is when blood comes out of  the nose. Nosebleeds are common. Usually, they are not a sign of a serious condition. Nosebleeds can happen if a blood vessel in your nose starts to bleed or if the lining of your nose (mucous membrane) cracks. They are commonly caused by:  Allergies.  Colds.  Picking your nose.  Blowing your nose too hard.  An injury from sticking an object into your nose or getting hit in the nose.  Dry or cold air. Less common causes of nosebleeds include:  Toxic fumes.  Something abnormal in the nose or in the air-filled spaces in the bones of the face (sinuses).  Growths in the nose, such as polyps.  Blood thinners or conditions that cause blood to clot slowly.  Certain illnesses or procedures that irritate or dry out the nasal passages. Follow these instructions at home: When you have a nosebleed:  Sit down and tilt your head slightly forward.  Use a clean towel or tissue to pinch your nostrils under the bony part of your nose. After 5 minutes, let go of your nose and see if bleeding  starts again. Do not release pressure before that time. If there is still bleeding, repeat the pinching and holding for 5 minutes or until the bleeding stops.  Do not place tissues or gauze in the nose to stop the bleeding.  Avoid lying down and avoid tilting your head backward. That may make blood collect in the throat and cause gagging or coughing.  Use a nasal spray decongestant to help with a nosebleed as told by your health care provider.   After a nosebleed:  Avoid blowing your nose or sniffing for a number of hours.  Avoid straining, lifting, or bending at the waist for several days. You may go back to other normal activities as you are able.  If you are taking aspirin or blood thinners and you have nosebleeds, talk to your health care provider. These medicines make bleeding more likely. ? Ask your health care provider if you should stop taking the medicines or if you should adjust the dose. ? Do  not stop taking medicines that your health care provider has recommended unless he or she tells you to stop taking them.  If your nosebleed was caused by dry mucous membranes, use over-the-counter saline nasal spray or gel and a humidifier as told by your health care provider. This will keep the mucous membranes moist and allow them to heal. If you need to use one of these products: ? Choose one that is water-soluble. ? Use only as much as you need and use it only as often as needed. ? Do not lie down right after you use it.  If you get nosebleeds often, talk with your health care provider about medical treatments. Options may include: ? Nasal cautery. This treatment stops and prevents nosebleeds by using a chemical swab or electrical device to lightly burn tiny blood vessels inside the nose. ? Nasal packing. A gauze or other material is placed in the nose to keep constant pressure on the bleeding area. Contact a health care provider if you:  Have a fever.  Get nosebleeds often or more often than usual.  Bruise very easily.  Have a nosebleed from having something stuck in your nose.  Have bleeding in your mouth.  Vomit or cough up brown material.  Have a nosebleed after you start a new medicine. Get help right away if:  You have a nosebleed after a fall or a head injury.  Your nosebleed does not go away after 20 minutes.  You feel dizzy or weak.  You have unusual bleeding from other parts of your body.  You have unusual bruising on other parts of your body.  You become sweaty.  You vomit blood. Summary  A nosebleed is when blood comes out of the nose. Common causes include allergies, an injury to the nose, or cold or dry air.  Initial treatment includes applying pressure for 5 minutes.  Moisturizing the nose with saline nasal spray or gel after a nosebleed may help prevent future bleeding.  Get help right away if your nosebleed does not go away after 20 minutes. This  information is not intended to replace advice given to you by your health care provider. Make sure you discuss any questions you have with your health care provider. Document Revised: 06/06/2019 Document Reviewed: 06/06/2019 Elsevier Patient Education  2021 [employer].

## 2021-01-13 NOTE — Telephone Encounter (Signed)
Noted  

## 2021-01-13 NOTE — Progress Notes (Signed)
Onset 4 days ago, headache and congestion. No one around the Patient is sick.  Patient coughing sometime this is productive with yellow dark phlegm. Runny nose and drainage to the back of the throat.  Patient had a nose bleed night before last.   Patient has not been Covid test.

## 2021-01-13 NOTE — Progress Notes (Signed)
Telephone Note  I connected with Deborah Bartlett  on 01/13/21 at  4 PM EDT by a telephone  and verified that I am speaking with the correct person using two identifiers.  Location patient: home, [place] Location provider:work or home office Persons participating in the virtual visit: patient, provider  I discussed the limitations of evaluation and management by telemedicine and the availability of in person appointments. The patient expressed understanding and agreed to proceed.   HPI: 1. Cough x 3-4 days no h/o asthma, never smoker but also having runny nose, nose bleed the other day, sore throat, pnd, no sob, wheezing, +sinus h/a around her nose tried black elderberry. She is coughing dark yellow phelgm constant cough to the pointe of being nauseated. No sick exposures she knows of but not always wearing mask in public. CT chest 10/2020 negative etiology for acute cough   -COVID-19 vaccine status: 3/3  ROS: See pertinent positives and negatives per HPI.  Past Medical History:  Diagnosis Date  . Diabetes mellitus without complication (HCC)   . GERD (gastroesophageal reflux disease)   . Hypercholesteremia   . Hypertension   . Migraine    irregular, rare  . Perimenopausal 2014    Past Surgical History:  Procedure Laterality Date  . 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 CESAREAN SECTION  1990, 1993, 1999  . COLONOSCOPY  2014   Dr Lemar Livings  . ORIF ELBOW FRACTURE Left 05/26/2020   Procedure: OPEN REDUCTION INTERNAL FIXATION (ORIF) ELBOW/OLECRANON FRACTURE;  Surgeon: Christena Flake, MD;  Location: [facility];  Service: Orthopedics;  Laterality: Left;  . SHOULDER ARTHROSCOPY WITH ROTATOR CUFF REPAIR Right 02/21/2018   Procedure: SHOULDER ARTHROSCOPY WITH DEBRIDEMENT DECOMPRESSION AND REPAIR OF ROTATOR CUFF TEAR;  Surgeon: Christena Flake, MD;  Location: [facility];  Service: Orthopedics;  Laterality: Right;  Diabetic - diet controlled     Current Outpatient  Medications:  .  albuterol (VENTOLIN HFA) 108 (90 Base) MCG/ACT inhaler, Inhale 1-2 puffs into the lungs every 6 (six) hours as needed for wheezing or shortness of breath., Disp: 18 g, Rfl: 0 .  amLODipine (NORVASC) 5 MG tablet, TAKE 1 TABLET BY MOUTH TWICE A DAY, Disp: 180 tablet, Rfl: 1 .  aspirin 81 MG tablet, Take 81 mg by mouth daily., Disp: , Rfl:  .  azithromycin (ZITHROMAX) 250 MG tablet, With food Take 2 tablets on day 1, then 1 tablet daily on days 2 through 5, Disp: 6 tablet, Rfl: 0 .  carvedilol (COREG) 6.25 MG tablet, TAKE 1 TABLET BY MOUTH 2 TIMES DAILY WITH A MEAL., Disp: 60 tablet, Rfl: 1 .  cetirizine (ZYRTEC) 10 MG tablet, Take 10 mg by mouth daily., Disp: , Rfl:  .  Dextromethorphan-Guaifenesin 60-1200 MG 12hr tablet, Take 1 tablet by mouth every 12 (twelve) hours., Disp: 60 tablet, Rfl: 0 .  ferrous sulfate 325 (65 FE) MG tablet, Take 325 mg by mouth daily with breakfast., Disp: , Rfl:  .  Lancets (ONETOUCH ULTRASOFT) lancets, Use as instructed to check Blood sugars 3-4 times a week, twice a day.  Dispense One Touch brand. E11.9, Disp: 100 each, Rfl: 12 .  losartan (COZAAR) 100 MG tablet, TAKE 1 TABLET BY MOUTH EVERY DAY, Disp: 90 tablet, Rfl: 1 .  Magnesium 250 MG TABS, Take 250 mg by mouth at bedtime., Disp: , Rfl:  .  omeprazole (PRILOSEC) 20 MG capsule, Take 20 mg by mouth daily., Disp: , Rfl:  .  ONETOUCH  VERIO test strip, CHECK BLOOD SUGAR TWICE A DAY 3 TO 4 TIMES A WEEK, Disp: 100 strip, Rfl: 12 .  rosuvastatin (CRESTOR) 40 MG tablet, TAKE 1 TABLET BY MOUTH EVERY DAY, Disp: 90 tablet, Rfl: 1 .  solifenacin (VESICARE) 5 MG tablet, TAKE 1 TABLET BY MOUTH EVERY DAY, Disp: 90 tablet, Rfl: 2 .  vitamin B-12 (CYANOCOBALAMIN) 1000 MCG tablet, Take 1,000 mcg by mouth daily., Disp: , Rfl:  .  Vitamin D3 (VITAMIN D) 25 MCG tablet, Take 1,000 Units by mouth daily., Disp: , Rfl:  .  acetaminophen (TYLENOL) 500 MG tablet, Take 500 mg by mouth every 6 (six) hours as needed for mild  pain. (Patient not taking: Reported on 01/13/2021), Disp: , Rfl:  .  gabapentin (NEURONTIN) 300 MG capsule, Take 300 mg by mouth at bedtime. (Patient not taking: Reported on 01/13/2021), Disp: , Rfl:  .  naproxen sodium (ALEVE) 220 MG tablet, Take 440 mg by mouth 2 (two) times daily. (Patient not taking: Reported on 01/13/2021), Disp: , Rfl:   EXAM:  VITALS per patient if applicable:  GENERAL: alert, oriented, appears well and in no acute distress  HEENT: atraumatic, conjunttiva clear, no obvious abnormalities on inspection of external nose and ears  NECK: normal movements of the head and neck  LUNGS: on inspection no signs of respiratory distress, breathing rate appears normal, no obvious gross SOB, gasping or wheezing +cough on exam   CV: no obvious cyanosis  MS: moves all visible extremities without noticeable abnormality  PSYCH/NEURO: pleasant and cooperative, no obvious depression or anxiety, speech and thought processing grossly intact  ASSESSMENT AND PLAN:  Discussed the following assessment and plan:  Bronchitis/URI/sinusitis- Plan: albuterol (VENTOLIN HFA) 108 (90 Base) MCG/ACT inhaler, Dextromethorphan-Guaifenesin 60-1200 MG 12hr tablet, azithromycin (ZITHROMAX) 250 MG tablet rec covid 19 testing [facility] call today given #  Ns, zyrtec Try a multivitamin for now and hydration with fluids and rest   -we discussed possible serious and likely etiologies, options for evaluation and workup, limitations of telemedicine visit vs in person visit, treatment, treatment risks and precautions. Pt prefers to treat via telemedicine empirically rather than in person at this moment.      I discussed the assessment and treatment plan with the patient. The patient was provided an opportunity to ask questions and all were answered. The patient agreed with the plan and demonstrated an understanding of the instructions.    Time spent 20 min Bevelyn Buckles, MD

## 2021-01-13 NOTE — Telephone Encounter (Signed)
I scheduled a VV appt for pt with Dr TMS this afternoon at 3:30. FYI

## 2021-01-24 ENCOUNTER — Other Ambulatory Visit: Payer: Self-pay | Admitting: Internal Medicine

## 2021-02-05 ENCOUNTER — Other Ambulatory Visit: Payer: Self-pay | Admitting: Internal Medicine

## 2021-02-05 DIAGNOSIS — R059 Cough, unspecified: Secondary | ICD-10-CM

## 2021-02-05 DIAGNOSIS — J40 Bronchitis, not specified as acute or chronic: Secondary | ICD-10-CM

## 2021-02-15 ENCOUNTER — Ambulatory Visit (INDEPENDENT_AMBULATORY_CARE_PROVIDER_SITE_OTHER): Payer: BC Managed Care – PPO | Admitting: Internal Medicine

## 2021-02-15 ENCOUNTER — Encounter: Payer: Self-pay | Admitting: Internal Medicine

## 2021-02-15 ENCOUNTER — Other Ambulatory Visit: Payer: Self-pay

## 2021-02-15 VITALS — BP 118/72 | HR 80 | Temp 98.1°F | Ht 60.0 in | Wt 160.0 lb

## 2021-02-15 DIAGNOSIS — R748 Abnormal levels of other serum enzymes: Secondary | ICD-10-CM

## 2021-02-15 DIAGNOSIS — Z0000 Encounter for general adult medical examination without abnormal findings: Secondary | ICD-10-CM

## 2021-02-15 DIAGNOSIS — E7800 Pure hypercholesterolemia, unspecified: Secondary | ICD-10-CM

## 2021-02-15 DIAGNOSIS — R079 Chest pain, unspecified: Secondary | ICD-10-CM | POA: Diagnosis not present

## 2021-02-15 DIAGNOSIS — Z0001 Encounter for general adult medical examination with abnormal findings: Secondary | ICD-10-CM

## 2021-02-15 DIAGNOSIS — K219 Gastro-esophageal reflux disease without esophagitis: Secondary | ICD-10-CM | POA: Diagnosis not present

## 2021-02-15 DIAGNOSIS — E279 Disorder of adrenal gland, unspecified: Secondary | ICD-10-CM | POA: Diagnosis not present

## 2021-02-15 DIAGNOSIS — E1165 Type 2 diabetes mellitus with hyperglycemia: Secondary | ICD-10-CM

## 2021-02-15 DIAGNOSIS — I10 Essential (primary) hypertension: Secondary | ICD-10-CM
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
## 2021-02-16 ENCOUNTER — Encounter: Payer: Self-pay | Admitting: Internal Medicine

## 2021-02-16 DIAGNOSIS — R079 Chest pain, unspecified: Secondary | ICD-10-CM | POA: Insufficient documentation

## 2021-02-16 DIAGNOSIS — R748 Abnormal levels of other serum enzymes: Secondary | ICD-10-CM | POA: Insufficient documentation

## 2021-02-16 NOTE — Assessment & Plan Note (Signed)
Consistent with adrenal adenoma.  Work-up unrevealing.  Saw Dr. Gershon Crane.  Recommended follow-up in 1 year.

## 2021-02-16 NOTE — Assessment & Plan Note (Signed)
On Prilosec.  If she misses a dose she will notice symptoms.  Discussed referral to GI for further evaluation.  She is in agreement.  Question need for upper endoscopy.

## 2021-02-16 NOTE — Assessment & Plan Note (Signed)
Has been having the episodes of chest pain as outlined.  No clear trigger.  Does not necessarily occur with increased activity or exertion.  Does not occur after eating.  She does have risk factors.  EKG obtained reveals sinus rhythm with no acute ischemic changes.  Nonspecific changes noted.  Discussed further work-up and evaluation.  Continue Prilosec.  Controlling acid reflux.  No other abdominal pain.  She has been referred to GI given the dysphagia and the acid reflux.  We will have cardiology evaluate with questionable need for further cardiac work-up.

## 2021-02-16 NOTE — Assessment & Plan Note (Signed)
Physical today 02/16/2020.  Pap 10/01/2018 negative with negative HPV.  Last mammogram February 2020.  Schedule follow-up mammogram.  Colonoscopy October 2014.  Normal.  Recommended follow-up in 10 years.  Referring back to GI for possible upper endoscopy.

## 2021-02-16 NOTE — Assessment & Plan Note (Signed)
Noted to have elevated alkaline phosphatase.  Remainder of liver panel within normal limits.  Previous abdominal ultrasound revealed changes suggestive of fatty liver.  Been referred to GI for further evaluation of her upper symptoms.  We will have them evaluate as well regarding the elevated alk phos to confirm whether or not any further work-up warranted.

## 2021-02-16 NOTE — Assessment & Plan Note (Signed)
Low cholesterol diet and exercise.  Follow lipid panel.   

## 2021-02-16 NOTE — Assessment & Plan Note (Signed)
Continues on Coreg, losartan and amlodipine.  Blood pressure on recheck a little elevated.  Have her spot check her pressure.  Follow.  Get her back in sooner to reassess.  Follow metabolic panel.  Hold on making changes in medication.

## 2021-02-16 NOTE — Assessment & Plan Note (Addendum)
Low-carb diet and exercise given elevated sugar.  Follow metabolic panel and A1c. 

## 2021-02-18 ENCOUNTER — Ambulatory Visit (INDEPENDENT_AMBULATORY_CARE_PROVIDER_SITE_OTHER): Payer: BC Managed Care – PPO | Admitting: Gastroenterology

## 2021-02-18 ENCOUNTER — Other Ambulatory Visit: Payer: Self-pay

## 2021-02-18 ENCOUNTER — Encounter: Payer: Self-pay | Admitting: Gastroenterology

## 2021-02-18 VITALS — BP 113/76 | HR 85 | Ht 60.0 in | Wt 159.6 lb

## 2021-02-18 DIAGNOSIS — R748 Abnormal levels of other serum enzymes: Secondary | ICD-10-CM

## 2021-02-18 NOTE — Progress Notes (Signed)
Gastroenterology Consultation  Referring Provider:     Dale [place], MD Primary Care Physician:  Dale [place], MD Primary Gastroenterologist:  Dr. Servando Snare     Reason for Consultation:     Elevated alkaline phosphatase        HPI:   Deborah Bartlett is a 60 y.o. y/o female referred for consultation & management of elevated alkaline phosphatase by Dr. Lorin Picket, Westley Hummer, MD.  This patient comes in to see me today after having an elevated alkaline phosphatase dating back to 2018 with a trend showing:  Component     Latest Ref Rng & Units 05/08/2017 12/05/2017 10/01/2018 10/16/2018  Alkaline Phosphatase     39 - 117 U/L 121 (H) 93 128 (H) 155 (H)   Component     Latest Ref Rng & Units 10/31/2018 11/12/2018 03/23/2020 11/02/2020  Alkaline Phosphatase     39 - 117 U/L 164 (H) 151 (H) 141 (H) 127 (H)   Component     Latest Ref Rng & Units 12/02/2020  Alkaline Phosphatase     39 - 117 U/L 146 (H)   The patient had fractionation of the alkaline phosphatase back in 2020 during the month of March which showed it to be 60% from the liver.  The patient has not seen gastroenterology for this but did have a colonoscopy back in 2014 by Dr. Lemar Livings that was reported to be normal. She had an U/S in 202 that showed:  IMPRESSION: Slightly echogenic liver suggesting fatty change. This could be associated with and somatic abnormalities. No focal lesion, gallbladder disease or ductal disease.  The patient reports that she is not having any itching at the present time.  She reports that she has not had any other issues with her liver in the past.  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   CESAREAN SECTION   1990, 1993, 1999   COLONOSCOPY  2014   Dr Lemar Livings   ORIF ELBOW FRACTURE Left 05/26/2020   Procedure: OPEN REDUCTION INTERNAL FIXATION (ORIF) ELBOW/OLECRANON FRACTURE;  Surgeon: Christena Flake, MD;  Location: [facility];  Service: Orthopedics;  Laterality: Left;   SHOULDER ARTHROSCOPY WITH ROTATOR CUFF REPAIR Right 02/21/2018   Procedure: SHOULDER ARTHROSCOPY WITH DEBRIDEMENT DECOMPRESSION AND REPAIR OF ROTATOR CUFF TEAR;  Surgeon: Christena Flake, MD;  Location: [facility];  Service: Orthopedics;  Laterality: Right;  Diabetic - diet controlled    Prior to Admission medications   Medication Sig Start Date End Date Taking? Authorizing Provider  albuterol (VENTOLIN HFA) 108 (90 Base) MCG/ACT inhaler INHALE 1-2 PUFFS BY MOUTH EVERY 6 HOURS AS NEEDED FOR WHEEZE OR SHORTNESS OF BREATH 02/05/21   Dale Donaldson, MD  amLODipine (NORVASC) 5 MG tablet TAKE 1 TABLET BY MOUTH TWICE A DAY 02/05/21   Dale [place], MD  aspirin 81 MG tablet Take 81 mg by mouth daily.    [provider]  carvedilol (COREG) 6.25 MG tablet TAKE 1 TABLET BY MOUTH 2 TIMES DAILY WITH A MEAL. 12/29/20   Dale , MD  cetirizine (ZYRTEC) 10 MG tablet Take 10 mg by mouth daily.    [provider]  ferrous sulfate 325 (65  FE) MG tablet Take 325 mg by mouth daily with breakfast.    [provider]  Lancets (ONETOUCH ULTRASOFT) lancets Use as instructed to check Blood sugars 3-4 times a week, twice a day.  Dispense One Touch brand. E11.9 02/17/16   Dale [place], MD  losartan (COZAAR) 100 MG tablet TAKE 1 TABLET BY MOUTH EVERY DAY 02/05/21   Dale [place], MD  Magnesium 250 MG TABS Take 250 mg by mouth at bedtime.    [provider]  omeprazole (PRILOSEC) 20 MG capsule Take 20 mg by mouth daily.    [provider]  ONETOUCH VERIO test strip CHECK BLOOD SUGAR TWICE A DAY 3 TO 4 TIMES A WEEK 11/19/19   Dale [place], MD  rosuvastatin (CRESTOR) 40 MG tablet TAKE 1 TABLET BY MOUTH EVERY  DAY 02/05/21   Dale [place], MD  solifenacin (VESICARE) 5 MG tablet TAKE 1 TABLET BY MOUTH EVERY DAY 08/20/20   Dale [place], MD  vitamin B-12 (CYANOCOBALAMIN) 1000 MCG tablet Take 1,000 mcg by mouth daily.    [provider]  Vitamin D3 (VITAMIN D) 25 MCG tablet Take 1,000 Units by mouth daily.    [provider]    Family History  Problem Relation Age of Onset   Hypertension Mother    Diabetes Mother    Diabetes Maternal Grandmother    Diabetes Maternal Grandfather    Breast cancer Neg Hx    Colon cancer Neg Hx      Social History   Tobacco Use   Smoking status: Never   Smokeless tobacco: Never  Vaping Use   Vaping Use: Never used  Substance Use Topics   Alcohol use: No    Alcohol/week: 0.0 standard drinks   Drug use: No    Allergies as of 02/18/2021 - Review Complete 02/16/2021  Allergen Reaction Noted   Codeine Palpitations 03/20/2013    Review of Systems:    All systems reviewed and negative except where noted in HPI.   Physical Exam:  LMP 02/13/2013  Patient's last menstrual period was 02/13/2013. General:   Alert,  Well-developed, well-nourished, pleasant and cooperative in NAD Head:  Normocephalic and atraumatic. Eyes:  Sclera clear, no icterus.   Conjunctiva pink. Ears:  Normal auditory acuity. Neck:  Supple; no masses or thyromegaly. Lungs:  Respirations even and unlabored.  Clear throughout to auscultation.   No wheezes, crackles, or rhonchi. No acute distress. Heart:  Regular rate and rhythm; no murmurs, clicks, rubs, or gallops. Abdomen:  Normal bowel sounds.  No bruits.  Soft, non-tender and non-distended without masses, hepatosplenomegaly or hernias noted.  No guarding or rebound tenderness.  Negative Carnett sign.   Rectal:  Deferred.  Pulses:  Normal pulses noted. Extremities:  No clubbing or edema.  No cyanosis. Neurologic:  Alert and oriented x3;  grossly normal neurologically. Skin:  Intact without significant lesions or  rashes.  No jaundice. Lymph Nodes:  No significant cervical adenopathy. Psych:  Alert and cooperative. Normal mood and affect.  Imaging Studies: No results found.  Assessment and Plan:   ERIENNE SPELMAN is a 60 y.o. y/o female who comes in with an isolated alkaline phosphatase that appears to be from a liver source.  Following the algorithm illustrated below the patient will be set up for an antimitochondrial antibody to rule out PBC. The patient has early been told that she has fatty liver from her previous ultrasound. That was a few years ago and I will have the repeat ultrasound ordered.  Midge Minium, MD. Clementeen Graham    Note: This dictation was prepared with Dragon dictation along with smaller phrase technology. Any transcriptional errors that result from this process are unintentional.

## 2021-02-22 LAB — MITOCHONDRIAL ANTIBODIES: Mitochondrial Ab: <20 Units (ref 0.0–20.0)

## 2021-02-23 ENCOUNTER — Telehealth: Payer: Self-pay

## 2021-02-23 NOTE — Telephone Encounter (Signed)
Pt notified of lab results through Mychart. 

## 2021-02-23 NOTE — Telephone Encounter (Signed)
-----   Message from Midge Minium, MD sent at 02/23/2021  7:55 AM EDT ----- Let the patient know that the blood test she took for [facility] which we discussed during her office visit was negative for that disease.

## 2021-03-02 ENCOUNTER — Other Ambulatory Visit: Payer: Self-pay | Admitting: Internal Medicine

## 2021-03-06 ENCOUNTER — Other Ambulatory Visit: Payer: Self-pay | Admitting: Internal Medicine

## 2021-03-08 ENCOUNTER — Other Ambulatory Visit: Payer: Self-pay

## 2021-03-08 ENCOUNTER — Ambulatory Visit
Admission: RE | Admit: 2021-03-08 | Discharge: 2021-03-08 | Disposition: A | Discharge status: Home / Self Care | Payer: BC Managed Care – PPO | Source: Ambulatory Visit | Attending: Gastroenterology | Admitting: Gastroenterology

## 2021-03-08 DIAGNOSIS — R748 Abnormal levels of other serum enzymes: Secondary | ICD-10-CM | POA: Insufficient documentation

## 2021-03-12 ENCOUNTER — Encounter: Payer: Self-pay | Admitting: Internal Medicine

## 2021-03-15 ENCOUNTER — Telehealth: Payer: Self-pay

## 2021-03-15 NOTE — Telephone Encounter (Signed)
Pt notified through MyChart.

## 2021-03-15 NOTE — Telephone Encounter (Signed)
Called patient to see if she has started this new medication. LMTCB. I do not see anything in the chart about switching to this medication. Per Dr Roby Lofts last note, patient was to continue amlodipine along with her other medication.

## 2021-03-15 NOTE — Telephone Encounter (Signed)
-----   Message from Midge Minium, MD sent at 03/14/2021 10:34 AM EDT ----- Let the patient know that the ultrasound continued to show fatty live and she should continue to try and loose weight and a Mediterranean diet has been shown to help.

## 2021-03-16 NOTE — Telephone Encounter (Signed)
Confirmed with patient that she has not taken any of the nifedipine. She is still taking her amlodipine 5 mg BID. Patient was confused about why this medication was sent in.

## 2021-03-22 MED ORDER — AMLODIPINE BESYLATE 5 MG PO TABS: 5.0000 mg | ORAL_TABLET | Freq: Two times a day (BID) | ORAL | 1 refills | Status: DC

## 2021-03-26 ENCOUNTER — Ambulatory Visit: Payer: BC Managed Care – PPO | Admitting: Internal Medicine

## 2021-04-03 ENCOUNTER — Other Ambulatory Visit: Payer: Self-pay | Admitting: Internal Medicine

## 2021-04-05 ENCOUNTER — Other Ambulatory Visit: Payer: Self-pay | Admitting: Internal Medicine

## 2021-04-05 NOTE — Telephone Encounter (Signed)
Looks like rx  discontinued on 03/22/2021.

## 2021-04-05 NOTE — Telephone Encounter (Signed)
I have refused this medication.  I have not prescribed this medication previously.  Need to notify pharmacy she is not on this medication.  In reviewing chart, apparently was sent in previously by mistake.  Need to make sure off list and pharmacy aware - pt not on medication.

## 2021-04-06 ENCOUNTER — Ambulatory Visit: Payer: BC Managed Care – PPO | Admitting: Cardiovascular Disease

## 2021-04-19 NOTE — Telephone Encounter (Signed)
See previous note. Patient is taking amlodipine.

## 2021-04-27 ENCOUNTER — Other Ambulatory Visit: Payer: Self-pay | Admitting: Internal Medicine

## 2021-05-19 ENCOUNTER — Telehealth (INDEPENDENT_AMBULATORY_CARE_PROVIDER_SITE_OTHER): Payer: BC Managed Care – PPO | Admitting: Family Medicine

## 2021-05-19 ENCOUNTER — Encounter: Payer: Self-pay | Admitting: Family Medicine

## 2021-05-19 DIAGNOSIS — R0982 Postnasal drip: Secondary | ICD-10-CM

## 2021-05-19 NOTE — Progress Notes (Signed)
Virtual Visit via video Note  This visit type was conducted due to national recommendations for restrictions regarding the COVID-19 pandemic (e.g. social distancing).  This format is felt to be most appropriate for this patient at this time.  All issues noted in this document were discussed and addressed.  No physical exam was performed (except for noted visual exam findings with Video Visits).   I connected with Deborah Bartlett today at  1:15 PM EDT by a video enabled telemedicine application and verified that I am speaking with the correct person using two identifiers. Location patient: work Location provider: work  Persons participating in the virtual visit: patient, provider  I discussed the limitations, risks, security and privacy concerns of performing an evaluation and management service by telephone and the availability of in person appointments. I also discussed with the patient that there may be a patient responsible charge related to this service. The patient expressed understanding and agreed to proceed.  Reason for visit: same day visit  HPI: Postnasal drip: Patient notes onset of symptoms 3 to 4 days ago.  She has postnasal drip in the morning with some cough.  Some congestion.  Does have a history of allergies and notes they do typically affect her this time of year.  Notes her symptoms are not as bad throughout the day.  No sore throat.  No fevers.  No shortness of breath.  No taste or smell disturbances.  No known COVID exposures.  She has received 3 COVID vaccines.  She does take Zyrtec.  In the past she has been advised to use Flonase though she is not currently using that.  She has not tested herself for COVID-19.   ROS: See pertinent positives and negatives per HPI.  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   CESAREAN SECTION  1990, 1993, 1999   COLONOSCOPY  2014   Dr Lemar Livings   ORIF ELBOW FRACTURE Left 05/26/2020   Procedure: OPEN REDUCTION INTERNAL FIXATION (ORIF) ELBOW/OLECRANON FRACTURE;  Surgeon: Christena Flake, MD;  Location: [facility];  Service: Orthopedics;  Laterality: Left;   SHOULDER ARTHROSCOPY WITH ROTATOR CUFF REPAIR Right 02/21/2018   Procedure: SHOULDER ARTHROSCOPY WITH DEBRIDEMENT DECOMPRESSION AND REPAIR OF ROTATOR CUFF TEAR;  Surgeon: Christena Flake, MD;  Location: [facility];  Service: Orthopedics;  Laterality: Right;  Diabetic - diet controlled    Family History  Problem Relation Age of Onset   Hypertension Mother    Diabetes Mother    Diabetes Maternal Grandmother    Diabetes Maternal Grandfather    Breast cancer Neg Hx    Colon cancer Neg Hx     SOCIAL HX: Non-smoker   Current Outpatient Medications:    albuterol (VENTOLIN HFA) 108 (90 Base) MCG/ACT inhaler, INHALE 1-2 PUFFS BY MOUTH EVERY 6 HOURS AS NEEDED FOR WHEEZE OR SHORTNESS OF BREATH, Disp: 18 each, Rfl: 1   amLODipine (NORVASC) 5 MG tablet, Take 1 tablet (5 mg total) by mouth 2 (two) times daily., Disp: 180 tablet, Rfl: 1   aspirin 81 MG tablet, Take 81 mg by mouth daily., Disp: , Rfl:    carvedilol (COREG) 6.25 MG tablet, TAKE 1  TABLET BY MOUTH 2 TIMES DAILY WITH A MEAL., Disp: 60 tablet, Rfl: 1   cetirizine (ZYRTEC) 10 MG tablet, Take 10 mg by mouth daily., Disp: , Rfl:    ferrous sulfate 325 (65 FE) MG tablet, Take 325 mg by mouth daily with breakfast., Disp: , Rfl:    Lancets (ONETOUCH ULTRASOFT) lancets, Use as instructed to check Blood sugars 3-4 times a week, twice a day.  Dispense One Touch brand. E11.9, Disp: 100 each, Rfl: 12   losartan (COZAAR) 100 MG tablet, TAKE 1 TABLET BY MOUTH EVERY DAY, Disp: 90 tablet, Rfl: 1   Magnesium 250 MG TABS, Take 250 mg by  mouth at bedtime., Disp: , Rfl:    omeprazole (PRILOSEC) 20 MG capsule, Take 20 mg by mouth daily., Disp: , Rfl:    ONETOUCH VERIO test strip, CHECK BLOOD SUGAR TWICE A DAY 3 TO 4 TIMES A WEEK, Disp: 100 strip, Rfl: 12   rosuvastatin (CRESTOR) 40 MG tablet, TAKE 1 TABLET BY MOUTH EVERY DAY, Disp: 90 tablet, Rfl: 1   solifenacin (VESICARE) 5 MG tablet, TAKE 1 TABLET BY MOUTH EVERY DAY, Disp: 90 tablet, Rfl: 2   vitamin B-12 (CYANOCOBALAMIN) 1000 MCG tablet, Take 1,000 mcg by mouth daily., Disp: , Rfl:    Vitamin D3 (VITAMIN D) 25 MCG tablet, Take 1,000 Units by mouth daily., Disp: , Rfl:   EXAM:  VITALS per patient if applicable:  GENERAL: alert, oriented, appears well and in no acute distress  HEENT: atraumatic, conjunttiva clear, no obvious abnormalities on inspection of external nose and ears  NECK: normal movements of the head and neck  LUNGS: on inspection no signs of respiratory distress, breathing rate appears normal, no obvious gross SOB, gasping or wheezing  CV: no obvious cyanosis  MS: moves all visible extremities without noticeable abnormality  PSYCH/NEURO: pleasant and cooperative, no obvious depression or anxiety, speech and thought processing grossly intact  ASSESSMENT AND PLAN:  Discussed the following assessment and plan:  Problem List Items Addressed This Visit     Postnasal drip    Differential includes allergic rhinitis versus COVID versus other viral illness.  I encouraged her to get tested for COVID-19 and offered her to test through our office though she declines this and opts to test at home.  I did discuss that the home test are not quite as accurate regarding negative results.  She does have a history of allergies and the symptoms are consistent with those.  She will consistently use her Zyrtec.  She will start using her Flonase again.  If this does not help with her symptoms she will let us know.  If she tests positive for COVID she will let us know as  well.       No follow-ups on file.   I discussed the assessment and treatment plan with the patient. The patient was provided an opportunity to ask questions and all were answered. The patient agreed with the plan and demonstrated an understanding of the instructions.   The patient was advised to call back or seek an in-person evaluation if the symptoms worsen or if the condition fails to improve as anticipated.   Marikay Alar, MD

## 2021-05-19 NOTE — Assessment & Plan Note (Addendum)
Differential includes allergic rhinitis versus COVID versus other viral illness.  I encouraged her to get tested for COVID-19 and offered her to test through our office though she declines this and opts to test at home.  I did discuss that the home test are not quite as accurate regarding negative results.  She does have a history of allergies and the symptoms are consistent with those.  She will consistently use her Zyrtec.  She will start using her Flonase again.  If this does not help with her symptoms she will let us know.  If she tests positive for COVID she will let us know as well.

## 2021-05-21 ENCOUNTER — Encounter: Payer: Self-pay | Admitting: Internal Medicine

## 2021-05-21 NOTE — Telephone Encounter (Signed)
I spoke with patient & she has agreed to go to [facility] walk in or [facility]. I gave her the number to both and recommended that she calls first in case they adjusted their hours today due to inclement weather.

## 2021-05-21 NOTE — Telephone Encounter (Signed)
I would like for you to call her and let her know that if her eye is now red and "really sore" - I feel she needs an in person evaluation to confirm best treatment for her eye.  Recommend evaluation today.

## 2021-05-21 NOTE — Telephone Encounter (Signed)
Please call and confirm she is doing ok and where evaluated.

## 2021-05-26 NOTE — Telephone Encounter (Signed)
Patient is feeling better. She went over to urgent care across the street and the nurse advised warm compresses for her eye and did not feel visit was needed- symptoms have resolved. She will let me know if she needs anything.

## 2021-05-30 ENCOUNTER — Other Ambulatory Visit: Payer: Self-pay | Admitting: Internal Medicine

## 2021-07-02 ENCOUNTER — Other Ambulatory Visit: Payer: Self-pay | Admitting: Internal Medicine

## 2021-08-05 ENCOUNTER — Other Ambulatory Visit: Payer: Self-pay | Admitting: Internal Medicine

## 2021-08-31 ENCOUNTER — Telehealth: Payer: Self-pay | Admitting: Internal Medicine

## 2021-08-31 NOTE — Telephone Encounter (Signed)
Received notice overdue mammogram.  Need to schedule.   

## 2021-09-01 ENCOUNTER — Other Ambulatory Visit: Payer: Self-pay

## 2021-09-01 DIAGNOSIS — Z1231 Encounter for screening mammogram for malignant neoplasm of breast: Secondary | ICD-10-CM

## 2021-09-01 NOTE — Telephone Encounter (Signed)
I called the patient and the mailbox was full and I could not leave a message.  Message was sent to patient in mychart.  Niels Cranshaw,cma

## 2021-09-02 ENCOUNTER — Other Ambulatory Visit: Payer: Self-pay | Admitting: Internal Medicine

## 2021-09-13 LAB — HM DIABETES EYE EXAM

## 2021-09-18 ENCOUNTER — Other Ambulatory Visit: Payer: Self-pay | Admitting: Internal Medicine

## 2021-12-18 ENCOUNTER — Other Ambulatory Visit: Payer: Self-pay | Admitting: Internal Medicine

## 2021-12-21 ENCOUNTER — Encounter: Payer: Self-pay | Admitting: Internal Medicine

## 2021-12-22 MED ORDER — AZITHROMYCIN 250 MG PO TABS: ORAL_TABLET | ORAL | 0 refills | Status: AC

## 2022-02-10 ENCOUNTER — Other Ambulatory Visit: Payer: Self-pay | Admitting: Internal Medicine

## 2022-02-27 ENCOUNTER — Other Ambulatory Visit: Payer: Self-pay | Admitting: Internal Medicine

## 2022-02-28 MED ORDER — SOLIFENACIN SUCCINATE 5 MG PO TABS: 5.0000 mg | ORAL_TABLET | Freq: Every day | ORAL | 2 refills | Status: DC

## 2022-03-15 ENCOUNTER — Telehealth: Payer: Self-pay

## 2022-03-15 ENCOUNTER — Other Ambulatory Visit: Payer: Self-pay

## 2022-03-15 DIAGNOSIS — E1165 Type 2 diabetes mellitus with hyperglycemia: Secondary | ICD-10-CM

## 2022-03-15 DIAGNOSIS — E7800 Pure hypercholesterolemia, unspecified: Secondary | ICD-10-CM

## 2022-03-15 DIAGNOSIS — I10 Essential (primary) hypertension: Secondary | ICD-10-CM

## 2022-03-15 NOTE — Telephone Encounter (Signed)
Patient called to schedule her physical with Dr. Dale Fabrica.  Appointment has been scheduled for 06/06/2022.  Patient states she would like to know if Dr. Lorin Picket would like for her to have labs drawn before her physical.

## 2022-03-15 NOTE — Telephone Encounter (Signed)
Pt sched for labs 10/11  Orders placed

## 2022-06-01 ENCOUNTER — Other Ambulatory Visit (INDEPENDENT_AMBULATORY_CARE_PROVIDER_SITE_OTHER): Payer: BC Managed Care – PPO

## 2022-06-01 DIAGNOSIS — I10 Essential (primary) hypertension: Secondary | ICD-10-CM | POA: Diagnosis not present

## 2022-06-01 DIAGNOSIS — E7800 Pure hypercholesterolemia, unspecified: Secondary | ICD-10-CM

## 2022-06-01 DIAGNOSIS — E1165 Type 2 diabetes mellitus with hyperglycemia: Secondary | ICD-10-CM

## 2022-06-01 LAB — CBC WITH DIFFERENTIAL/PLATELET
Basophils Absolute: 0.1 10*3/uL (ref 0.0–0.1)
Basophils Relative: 1.2 % (ref 0.0–3.0)
Eosinophils Absolute: 0.2 10*3/uL (ref 0.0–0.7)
Eosinophils Relative: 4.4 % (ref 0.0–5.0)
HCT: 39 % (ref 36.0–46.0)
Hemoglobin: 12.9 g/dL (ref 12.0–15.0)
Lymphocytes Relative: 31.8 % (ref 12.0–46.0)
Lymphs Abs: 1.5 10*3/uL (ref 0.7–4.0)
MCHC: 32.9 g/dL (ref 30.0–36.0)
MCV: 90.4 fl (ref 78.0–100.0)
Monocytes Absolute: 0.5 10*3/uL (ref 0.1–1.0)
Monocytes Relative: 11.4 % (ref 3.0–12.0)
Neutro Abs: 2.4 10*3/uL (ref 1.4–7.7)
Neutrophils Relative %: 51.2 % (ref 43.0–77.0)
Platelets: 251 10*3/uL (ref 150.0–400.0)
RBC: 4.32 Mil/uL (ref 3.87–5.11)
RDW: 14.1 % (ref 11.5–15.5)
WBC: 4.6 10*3/uL (ref 4.0–10.5)

## 2022-06-01 LAB — BASIC METABOLIC PANEL
BUN: 19 mg/dL (ref 6–23)
CO2: 29 mEq/L (ref 19–32)
Calcium: 10.5 mg/dL (ref 8.4–10.5)
Chloride: 105 mEq/L (ref 96–112)
Creatinine, Ser: 0.92 mg/dL (ref 0.40–1.20)
GFR: 67.89 mL/min (ref >60.00)
Glucose, Bld: 109 mg/dL — ABNORMAL HIGH (ref 70–99)
Potassium: 4.6 mEq/L (ref 3.5–5.1)
Sodium: 140 mEq/L (ref 135–145)

## 2022-06-01 LAB — LIPID PANEL
Cholesterol: 185 mg/dL (ref 0–200)
HDL: 48 mg/dL (ref >39.00)
LDL Cholesterol: 97 mg/dL (ref 0–99)
NonHDL: 137.08
Total CHOL/HDL Ratio: 4
Triglycerides: 198 mg/dL — ABNORMAL HIGH (ref 0.0–149.0)
VLDL: 39.6 mg/dL (ref 0.0–40.0)

## 2022-06-01 LAB — TSH: TSH: 3.13 u[IU]/mL (ref 0.35–5.50)

## 2022-06-01 LAB — HEMOGLOBIN A1C: Hgb A1c MFr Bld: 6.6 % — ABNORMAL HIGH (ref 4.6–6.5)

## 2022-06-01 LAB — HEPATIC FUNCTION PANEL
ALT: 20 U/L (ref 0–35)
AST: 16 U/L (ref 0–37)
Albumin: 4.3 g/dL (ref 3.5–5.2)
Alkaline Phosphatase: 129 U/L — ABNORMAL HIGH (ref 39–117)
Bilirubin, Direct: 0.1 mg/dL (ref 0.0–0.3)
Total Bilirubin: 0.4 mg/dL (ref 0.2–1.2)
Total Protein: 7 g/dL (ref 6.0–8.3)

## 2022-06-06 ENCOUNTER — Encounter: Payer: Self-pay | Admitting: Internal Medicine

## 2022-06-06 ENCOUNTER — Ambulatory Visit (INDEPENDENT_AMBULATORY_CARE_PROVIDER_SITE_OTHER): Payer: BC Managed Care – PPO | Admitting: Internal Medicine

## 2022-06-06 ENCOUNTER — Other Ambulatory Visit (HOSPITAL_COMMUNITY)
Admission: RE | Admit: 2022-06-06 | Discharge: 2022-06-06 | Disposition: A | Discharge status: Home / Self Care | Payer: BC Managed Care – PPO | Source: Ambulatory Visit | Attending: Internal Medicine | Admitting: Internal Medicine

## 2022-06-06 VITALS — BP 124/80 | HR 80 | Temp 97.5°F | Resp 18 | Ht 60.0 in | Wt 167.2 lb

## 2022-06-06 DIAGNOSIS — I10 Essential (primary) hypertension: Secondary | ICD-10-CM | POA: Diagnosis not present

## 2022-06-06 DIAGNOSIS — M7989 Other specified soft tissue disorders: Secondary | ICD-10-CM

## 2022-06-06 DIAGNOSIS — E279 Disorder of adrenal gland, unspecified: Secondary | ICD-10-CM

## 2022-06-06 DIAGNOSIS — E7800 Pure hypercholesterolemia, unspecified: Secondary | ICD-10-CM

## 2022-06-06 DIAGNOSIS — Z0000 Encounter for general adult medical examination without abnormal findings: Secondary | ICD-10-CM | POA: Diagnosis present

## 2022-06-06 DIAGNOSIS — E1165 Type 2 diabetes mellitus with hyperglycemia: Secondary | ICD-10-CM

## 2022-06-06 DIAGNOSIS — R5383 Other fatigue: Secondary | ICD-10-CM

## 2022-06-06 DIAGNOSIS — R079 Chest pain, unspecified: Secondary | ICD-10-CM

## 2022-06-06 DIAGNOSIS — K219 Gastro-esophageal reflux disease without esophagitis: Secondary | ICD-10-CM | POA: Diagnosis not present

## 2022-06-06 DIAGNOSIS — R351 Nocturia: Secondary | ICD-10-CM

## 2022-06-06 MED ORDER — PANTOPRAZOLE SODIUM 40 MG PO TBEC: 40.0000 mg | DELAYED_RELEASE_TABLET | Freq: Every day | ORAL | 3 refills | Status: DC

## 2022-06-06 NOTE — Progress Notes (Signed)
Patient ID: Deborah Bartlett, female   DOB: 1961/01/27, 61 y.o.   MRN: [id number]   Subjective:    Patient ID: Deborah Bartlett, female    DOB: [birth year], 61 y.o.   MRN: [id number]   Patient here for  Chief Complaint  Patient presents with   Annual Exam    CPE w/ pap   .   HPI Recently evaluated by GI.  Fatty liver.  Per note - due colonoscopy next year.  Does report noticing a couple of weeks ago - chest pain.  Was in shower.  No increased sob.  Increased acid reflux.  Has noticed some increased swelling - legs.  No increased cough or congestion.  Blood pressure elevated - states 130s/80s.  Taking medication.  Wears compression hose.     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   CESAREAN SECTION  1990, 1993, 1999   COLONOSCOPY  2014   Dr Lemar Livings   ORIF ELBOW FRACTURE Left 05/26/2020   Procedure: OPEN REDUCTION INTERNAL FIXATION (ORIF) ELBOW/OLECRANON FRACTURE;  Surgeon: Christena Flake, MD;  Location: [facility];  Service: Orthopedics;  Laterality: Left;   SHOULDER ARTHROSCOPY WITH ROTATOR CUFF REPAIR Right 02/21/2018   Procedure: SHOULDER ARTHROSCOPY WITH DEBRIDEMENT DECOMPRESSION AND REPAIR OF ROTATOR CUFF TEAR;  Surgeon: Christena Flake, MD;  Location: [facility];  Service: Orthopedics;  Laterality: Right;  Diabetic - diet controlled   Family History  Problem Relation Age of Onset   Hypertension Mother    Diabetes Mother    Diabetes Maternal Grandmother    Diabetes Maternal Grandfather    Breast cancer Neg Hx    Colon cancer Neg Hx    Social History   Socioeconomic History   Marital status: Married    Spouse name: Not on file   Number of children: Not on file   Years of  education: Not on file   Highest education level: Not on file  Occupational History   Not on file  Tobacco Use   Smoking status: Never   Smokeless tobacco: Never  Vaping Use   Vaping Use: Never used  Substance and Sexual Activity   Alcohol use: No    Alcohol/week: 0.0 standard drinks of alcohol   Drug use: No   Sexual activity: Not on file  Other Topics Concern   Not on file  Social History Narrative   Not on file   Social Determinants of Health   Financial Resource Strain: Not on file  Food Insecurity: Not on file  Transportation Needs: Not on file  Physical Activity: Not on file  Stress: Not on file  Social Connections: Not on file     Review of Systems  Constitutional:  Negative for appetite change and unexpected weight change.  HENT:  Negative for congestion, sinus pressure and sore throat.   Eyes:  Negative for pain and visual disturbance.  Respiratory:  Negative for cough.        No increased sob.   Cardiovascular:  Positive for chest pain and leg swelling. Negative for palpitations.  Gastrointestinal:  Negative for abdominal pain, diarrhea, nausea and vomiting.  Genitourinary:  Negative for difficulty urinating, dysuria and frequency.  Musculoskeletal:  Negative for joint swelling and myalgias.  Skin:  Negative for color change and rash.  Neurological:  Negative for dizziness, light-headedness and headaches.  Hematological:  Negative for adenopathy. Does not bruise/bleed easily.  Psychiatric/Behavioral:  Negative for agitation and dysphoric mood.        Objective:     BP 124/80 (BP Location: Left Arm, Patient Position: Sitting, Cuff Size: Normal)   Pulse 80   Temp (!) 97.5 F (36.4 C) (Oral)   Resp 18   Ht 5' (1.524 m)   Wt 167 lb 3.2 oz (75.8 kg)   LMP 02/13/2013   SpO2 96%   BMI 32.65 kg/m  Wt Readings from Last 3 Encounters:  06/06/22 167 lb 3.2 oz (75.8 kg)  05/19/21 160 lb (72.6 kg)  02/18/21 159 lb 9.6 oz (72.4 kg)    Physical  Exam Vitals reviewed.  Constitutional:      General: She is not in acute distress.    Appearance: Normal appearance. She is well-developed.  HENT:     Head: Normocephalic and atraumatic.     Right Ear: External ear normal.     Left Ear: External ear normal.  Eyes:     General: No scleral icterus.       Right eye: No discharge.        Left eye: No discharge.     Conjunctiva/sclera: Conjunctivae normal.  Neck:     Thyroid: No thyromegaly.  Cardiovascular:     Rate and Rhythm: Normal rate and regular rhythm.  Pulmonary:     Effort: No tachypnea, accessory muscle usage or respiratory distress.     Breath sounds: Normal breath sounds. No decreased breath sounds or wheezing.  Chest:  Breasts:    Right: No inverted nipple, mass, nipple discharge or tenderness (no axillary adenopathy).     Left: No inverted nipple, mass, nipple discharge or tenderness (no axilarry adenopathy).  Abdominal:     General: Bowel sounds are normal.     Palpations: Abdomen is soft.     Tenderness: There is no abdominal tenderness.  Musculoskeletal:        General: No tenderness.     Cervical back: Neck supple.     Comments: Increased lower extremity edema/pedal edema.   Lymphadenopathy:     Cervical: No cervical adenopathy.  Skin:    Findings: No erythema or rash.  Neurological:     Mental Status: She is alert and oriented to person, place, and time.  Psychiatric:        Mood and Affect: Mood normal.        Behavior: Behavior normal.      Outpatient Encounter Medications as of 06/06/2022  Medication Sig   amLODipine (NORVASC) 5 MG tablet TAKE 1 TABLET BY MOUTH TWICE A DAY   aspirin 81 MG tablet Take 81 mg by mouth daily.   carvedilol (COREG) 6.25 MG tablet TAKE 1 TABLET BY MOUTH 2 TIMES DAILY WITH A MEAL.   ferrous sulfate 325 (65 FE) MG tablet Take 325 mg by mouth daily with breakfast.   Lancets (ONETOUCH ULTRASOFT) lancets Use as instructed to check Blood sugars 3-4 times a week, twice a day.   Dispense One Touch brand. E11.9   losartan (COZAAR) 100 MG tablet TAKE 1 TABLET BY MOUTH EVERY DAY   Magnesium 250 MG TABS Take 250 mg by mouth at bedtime.   ONETOUCH VERIO test strip CHECK BLOOD SUGAR TWICE A DAY 3 TO 4 TIMES A WEEK  pantoprazole (PROTONIX) 40 MG tablet Take 1 tablet (40 mg total) by mouth daily. Take 30 minutes before breakfast   rosuvastatin (CRESTOR) 40 MG tablet TAKE 1 TABLET BY MOUTH EVERY DAY   solifenacin (VESICARE) 5 MG tablet Take 1 tablet (5 mg total) by mouth daily.   vitamin B-12 (CYANOCOBALAMIN) 1000 MCG tablet Take 1,000 mcg by mouth daily.   Vitamin D3 (VITAMIN D) 25 MCG tablet Take 1,000 Units by mouth daily.   [DISCONTINUED] omeprazole (PRILOSEC) 20 MG capsule Take 20 mg by mouth daily.   [DISCONTINUED] albuterol (VENTOLIN HFA) 108 (90 Base) MCG/ACT inhaler INHALE 1-2 PUFFS BY MOUTH EVERY 6 HOURS AS NEEDED FOR WHEEZE OR SHORTNESS OF BREATH   [DISCONTINUED] cetirizine (ZYRTEC) 10 MG tablet Take 10 mg by mouth daily. (Patient not taking: Reported on 06/06/2022)   No facility-administered encounter medications on file as of 06/06/2022.     Lab Results  Component Value Date   WBC 4.6 06/01/2022   HGB 12.9 06/01/2022   HCT 39.0 06/01/2022   PLT 251.0 06/01/2022   GLUCOSE 109 (H) 06/01/2022   CHOL 185 06/01/2022   TRIG 198.0 (H) 06/01/2022   HDL 48.00 06/01/2022   LDLDIRECT 134.0 10/16/2018   LDLCALC 97 06/01/2022   ALT 20 06/01/2022   AST 16 06/01/2022   NA 140 06/01/2022   K 4.6 06/01/2022   CL 105 06/01/2022   CREATININE 0.92 06/01/2022   BUN 19 06/01/2022   CO2 29 06/01/2022   TSH 3.13 06/01/2022   INR 1.0 11/02/2020   HGBA1C 6.6 (H) 06/01/2022   MICROALBUR 1.8 03/23/2020    US ABDOMEN LIMITED RUQ (LIVER/GB)  Result Date: 03/08/2021 CLINICAL DATA:  Elevated liver enzymes EXAM: ULTRASOUND ABDOMEN LIMITED RIGHT UPPER QUADRANT COMPARISON:  CT scan March 01, 2019 FINDINGS: Gallbladder: No gallstones or wall thickening visualized. No sonographic  Murphy sign noted by sonographer. Common bile duct: Diameter: 2 mm Liver: Increased echogenicity. No focal mass. Portal vein is patent on color Doppler imaging with normal direction of blood flow towards the liver. Other: There is 1.9 cm right adrenal mass previously noted to be an adrenal adenoma on the March 01, 2019 study. IMPRESSION: 1. Nonspecific mild increased echogenicity in the liver. This is nonspecific but may be seen with hepatic steatosis. 2. Right adrenal adenoma better assessed on previous CT imaging. Electronically Signed   By: Dorise Bullion III M.D   On: 03/08/2021 16:40       Assessment & Plan:   Problem List Items Addressed This Visit     Chest pain    Chest pain as outlined.  EKG - SR. Non specific ST/T changes.  Discussed further evaluation and w/up.  Follow pressures.  Refer to cardiology for further evaluation and w/up.        Relevant Orders   EKG 12-Lead (Completed)   Ambulatory referral to Cardiology   Diabetes mellitus ([place])    Low-carb diet and exercise given elevated sugar.  Follow metabolic panel and Z6X.      Essential hypertension, benign    Continues on Coreg, losartan and amlodipine.  Blood pressure on recheck a little elevated.  Have her spot check her pressure.  Follow.  Get her back in soon to reassess.  Follow metabolic panel.  Hold on making changes in medication.      GERD (gastroesophageal reflux disease)    Acid reflux.  protonix 30 minutes before breakfast and pepcid 30 minutes before evening meal.  Had discussed GI f/u for question of  need for EGD.  Pursue cardiac w/up as outlined.       Relevant Medications   pantoprazole (PROTONIX) 40 MG tablet   Health care maintenance    Physical today 06/06/22.  PAP 06/06/22. Overdue mammogram.  Schedule.  Colonoscopy 05/2013.  Recommended f/u in 10 years.        Relevant Orders   Cytology - PAP( [place]) (Completed)   Hypercholesterolemia    Low-cholesterol diet and exercise.  Follow lipid  panel.      Lesion of adrenal gland (HCC)    Consistent with adrenal adenoma.  Work-up unrevealing.  Saw Dr. Gershon Crane.  Recommended follow-up in 1 year. Overdue f/u.       Swelling of lower extremity    Continue compression hose/socks.  Has been wearing compression hose regularly.  Reports worsening swelling.  Discussed vascular evaluation.       Relevant Orders   Ambulatory referral to Vascular Surgery   Other Visit Diagnoses     Routine general medical examination at a health care facility    -  Primary        Dale Cecilia, MD

## 2022-06-06 NOTE — Patient Instructions (Signed)
Take protonix 30 minutes before breakfast  Take pepcid 30 minutes before your evening meal.

## 2022-06-06 NOTE — Assessment & Plan Note (Addendum)
Physical today 06/06/22.  PAP 06/06/22. Overdue mammogram.  Schedule.  Colonoscopy 05/2013.  Recommended f/u in 10 years.

## 2022-06-08 LAB — CYTOLOGY - PAP
Comment: NEGATIVE
Diagnosis: NEGATIVE
High risk HPV: NEGATIVE

## 2022-06-08 NOTE — Telephone Encounter (Signed)
Please notify her that if she is getting up 3-4x/night - we need to evaluate for other possible causes.  Given her lower extremity swelling, blood pressure issues and the increased nighttime urination - I am concerned regarding possible sleep apnea.  Please confirm if she has ever been checked.  If no, I would like to get her referred to pulmonary for a home sleep test.  If agreeable, let me know and I will place order for referral.

## 2022-06-09 ENCOUNTER — Telehealth: Payer: Self-pay

## 2022-06-09 NOTE — Telephone Encounter (Signed)
Attempted to call Patient- no answer and voicemail is full 

## 2022-06-09 NOTE — Telephone Encounter (Signed)
Noted  

## 2022-06-10 ENCOUNTER — Telehealth: Payer: Self-pay

## 2022-06-10 NOTE — Telephone Encounter (Signed)
Attempted to call Patient again- no answer and voicemail is full 

## 2022-06-10 NOTE — Telephone Encounter (Signed)
Patient states she missed a couple of calls from [place], so she is calling to see what they are about.  I read Dr. Randell Patient Scott's message to patient.  Patient states she has never been checked for sleep apnea.  Patient states she would be agreeable to the referral to pulmonary for a home sleep test.

## 2022-06-10 NOTE — Telephone Encounter (Signed)
Attempted to call Patient again- no answer and voicemail is full

## 2022-06-11 NOTE — Assessment & Plan Note (Signed)
Consistent with adrenal adenoma.  Work-up unrevealing.  Saw Dr. Honor Junes.  Recommended follow-up in 1 year. Overdue f/u.

## 2022-06-11 NOTE — Telephone Encounter (Signed)
Order placed for referral to pulmonary.   

## 2022-06-11 NOTE — Assessment & Plan Note (Signed)
Low cholesterol diet and exercise.  Follow lipid panel.   

## 2022-06-11 NOTE — Assessment & Plan Note (Signed)
Continues on Coreg, losartan and amlodipine.  Blood pressure on recheck a little elevated.  Have her spot check her pressure.  Follow.  Get her back in soon to reassess.  Follow metabolic panel.  Hold on making changes in medication.

## 2022-06-11 NOTE — Assessment & Plan Note (Signed)
Chest pain as outlined.  EKG - SR. Non specific ST/T changes.  Discussed further evaluation and w/up.  Follow pressures.  Refer to cardiology for further evaluation and w/up.

## 2022-06-11 NOTE — Assessment & Plan Note (Addendum)
Acid reflux.  protonix 30 minutes before breakfast and pepcid 30 minutes before evening meal.  Had discussed GI f/u for question of need for EGD.  Pursue cardiac w/up as outlined.

## 2022-06-11 NOTE — Assessment & Plan Note (Signed)
Low-carb diet and exercise given elevated sugar.  Follow metabolic panel and A1c. 

## 2022-06-11 NOTE — Assessment & Plan Note (Signed)
Continue compression hose/socks.  Has been wearing compression hose regularly.  Reports worsening swelling.  Discussed vascular evaluation.

## 2022-07-26 ENCOUNTER — Encounter: Payer: Self-pay | Admitting: Internal Medicine

## 2022-07-26 ENCOUNTER — Ambulatory Visit: Payer: BC Managed Care – PPO | Admitting: Internal Medicine

## 2022-07-26 VITALS — BP 138/86 | HR 81 | Temp 98.5°F | Resp 16 | Ht 60.0 in | Wt 165.2 lb

## 2022-07-26 DIAGNOSIS — M7989 Other specified soft tissue disorders: Secondary | ICD-10-CM

## 2022-07-26 DIAGNOSIS — Z1231 Encounter for screening mammogram for malignant neoplasm of breast: Secondary | ICD-10-CM

## 2022-07-26 DIAGNOSIS — R748 Abnormal levels of other serum enzymes: Secondary | ICD-10-CM | POA: Diagnosis not present

## 2022-07-26 DIAGNOSIS — E1165 Type 2 diabetes mellitus with hyperglycemia: Secondary | ICD-10-CM | POA: Diagnosis not present

## 2022-07-26 DIAGNOSIS — E279 Disorder of adrenal gland, unspecified: Secondary | ICD-10-CM

## 2022-07-26 DIAGNOSIS — R079 Chest pain, unspecified: Secondary | ICD-10-CM

## 2022-07-26 DIAGNOSIS — I10 Essential (primary) hypertension: Secondary | ICD-10-CM

## 2022-07-26 DIAGNOSIS — K219 Gastro-esophageal reflux disease without esophagitis: Secondary | ICD-10-CM

## 2022-07-26 DIAGNOSIS — E7800 Pure hypercholesterolemia, unspecified: Secondary | ICD-10-CM

## 2022-07-26 MED ORDER — CARVEDILOL 12.5 MG PO TABS: 12.5000 mg | ORAL_TABLET | Freq: Two times a day (BID) | ORAL | 3 refills | Status: DC

## 2022-07-26 NOTE — Progress Notes (Signed)
Patient ID: Deborah Bartlett, female   DOB: 1961/02/13, 61 y.o.   MRN: [id number]   Subjective:    Patient ID: Deborah Bartlett, female    DOB: 06-18-1961, 61 y.o.   MRN: [id number]   Patient here for  Chief Complaint  Patient presents with   Follow-up   Diabetes   .   HPI Here to follow up diabetes, hypertension and lower extremity swelling.  Reports she has been doing relatively well.  Has had some increased posterior neck pain - extends to anterior chest.  If she moved a certain way - would aggravate.  Was questioning if slept wrong way or picked up something heavy.  Taking 4 ibuprofen q 4-5 hours.  Is better.  No chest pain with increased activity or exertion.  No increased cough or congestion.  No abdominal pain.  Bowels moving.  Blood pressures - mid to high 130s/80s.  Discussed medication adjustment.    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   CESAREAN SECTION  1990, 1993, 1999   COLONOSCOPY  2014   Dr Lemar Livings   ORIF ELBOW FRACTURE Left 05/26/2020   Procedure: OPEN REDUCTION INTERNAL FIXATION (ORIF) ELBOW/OLECRANON FRACTURE;  Surgeon: Christena Flake, MD;  Location: [facility];  Service: Orthopedics;  Laterality: Left;   SHOULDER ARTHROSCOPY WITH ROTATOR CUFF REPAIR Right 02/21/2018   Procedure: SHOULDER ARTHROSCOPY WITH DEBRIDEMENT DECOMPRESSION AND REPAIR OF ROTATOR CUFF TEAR;  Surgeon: Christena Flake, MD;  Location: [facility];  Service: Orthopedics;  Laterality: Right;  Diabetic - diet controlled   Family History  Problem Relation Age of Onset   Hypertension Mother    Diabetes Mother    Diabetes Maternal Grandmother    Diabetes Maternal Grandfather    Breast cancer Neg Hx     Colon cancer Neg Hx    Social History   Socioeconomic History   Marital status: Married    Spouse name: Not on file   Number of children: Not on file   Years of education: Not on file   Highest education level: Not on file  Occupational History   Not on file  Tobacco Use   Smoking status: Never   Smokeless tobacco: Never  Vaping Use   Vaping Use: Never used  Substance and Sexual Activity   Alcohol use: No    Alcohol/week: 0.0 standard drinks of alcohol   Drug use: No   Sexual activity: Not on file  Other Topics Concern   Not on file  Social History Narrative   Not on file   Social Determinants of Health   Financial Resource Strain: Not on file  Food Insecurity: Not on file  Transportation Needs: Not on file  Physical Activity: Not on file  Stress: Not on file  Social Connections: Not on file     Review of Systems  Constitutional:  Negative for appetite change and unexpected weight change.  HENT:  Negative for congestion and sinus pressure.   Respiratory:  Negative for cough, chest tightness and shortness of breath.   Cardiovascular:  Negative for palpitations and leg swelling.  Gastrointestinal:  Negative for abdominal pain, diarrhea, nausea and vomiting.  Genitourinary:  Negative  for difficulty urinating and dysuria.  Musculoskeletal:  Positive for neck pain. Negative for joint swelling and myalgias.  Skin:  Negative for color change and rash.  Neurological:  Negative for dizziness and headaches.  Psychiatric/Behavioral:  Negative for agitation and dysphoric mood.        Objective:     BP 138/86 (BP Location: Left Arm, Patient Position: Sitting, Cuff Size: Large)   Pulse 81   Temp 98.5 F (36.9 C) (Temporal)   Resp 16   Ht 5' (1.524 m)   Wt 165 lb 3.2 oz (74.9 kg)   LMP 02/13/2013   SpO2 98%   BMI 32.26 kg/m  Wt Readings from Last 3 Encounters:  07/26/22 165 lb 3.2 oz (74.9 kg)  06/06/22 167 lb 3.2 oz (75.8 kg)  05/19/21 160 lb (72.6 kg)     Physical Exam Vitals reviewed.  Constitutional:      General: She is not in acute distress.    Appearance: Normal appearance.  HENT:     Head: Normocephalic and atraumatic.     Right Ear: External ear normal.     Left Ear: External ear normal.  Eyes:     General: No scleral icterus.       Right eye: No discharge.        Left eye: No discharge.     Conjunctiva/sclera: Conjunctivae normal.  Neck:     Thyroid: No thyromegaly.  Cardiovascular:     Rate and Rhythm: Normal rate and regular rhythm.  Pulmonary:     Effort: No respiratory distress.     Breath sounds: Normal breath sounds. No wheezing.  Abdominal:     General: Bowel sounds are normal.     Palpations: Abdomen is soft.     Tenderness: There is no abdominal tenderness.  Musculoskeletal:        General: No swelling or tenderness.     Cervical back: Neck supple. No tenderness.     Comments: Increased tenderness/discomfort - palpation/movement - neck into anterior chest.   Lymphadenopathy:     Cervical: No cervical adenopathy.  Skin:    Findings: No erythema or rash.  Neurological:     Mental Status: She is alert.  Psychiatric:        Mood and Affect: Mood normal.        Behavior: Behavior normal.      Outpatient Encounter Medications as of 07/26/2022  Medication Sig   amLODipine (NORVASC) 5 MG tablet TAKE 1 TABLET BY MOUTH TWICE A DAY   aspirin 81 MG tablet Take 81 mg by mouth daily.   carvedilol (COREG) 12.5 MG tablet Take 1 tablet (12.5 mg total) by mouth 2 (two) times daily with a meal.   ferrous sulfate 325 (65 FE) MG tablet Take 325 mg by mouth daily with breakfast.   Lancets (ONETOUCH ULTRASOFT) lancets Use as instructed to check Blood sugars 3-4 times a week, twice a day.  Dispense One Touch brand. E11.9   losartan (COZAAR) 100 MG tablet TAKE 1 TABLET BY MOUTH EVERY DAY   Magnesium 250 MG TABS Take 250 mg by mouth at bedtime.   ONETOUCH VERIO test strip CHECK BLOOD SUGAR TWICE A DAY 3 TO 4 TIMES A WEEK    pantoprazole (PROTONIX) 40 MG tablet Take 1 tablet (40 mg total) by mouth daily. Take 30 minutes before breakfast   rosuvastatin (CRESTOR) 40 MG tablet TAKE 1 TABLET BY MOUTH EVERY DAY   solifenacin (VESICARE) 5 MG tablet Take 1 tablet (5  mg total) by mouth daily.   vitamin B-12 (CYANOCOBALAMIN) 1000 MCG tablet Take 1,000 mcg by mouth daily.   Vitamin D3 (VITAMIN D) 25 MCG tablet Take 1,000 Units by mouth daily.   [DISCONTINUED] carvedilol (COREG) 6.25 MG tablet TAKE 1 TABLET BY MOUTH 2 TIMES DAILY WITH A MEAL.   No facility-administered encounter medications on file as of 07/26/2022.     Lab Results  Component Value Date   WBC 4.6 06/01/2022   HGB 12.9 06/01/2022   HCT 39.0 06/01/2022   PLT 251.0 06/01/2022   GLUCOSE 109 (H) 06/01/2022   CHOL 185 06/01/2022   TRIG 198.0 (H) 06/01/2022   HDL 48.00 06/01/2022   LDLDIRECT 134.0 10/16/2018   LDLCALC 97 06/01/2022   ALT 20 06/01/2022   AST 16 06/01/2022   NA 140 06/01/2022   K 4.6 06/01/2022   CL 105 06/01/2022   CREATININE 0.92 06/01/2022   BUN 19 06/01/2022   CO2 29 06/01/2022   TSH 3.13 06/01/2022   INR 1.0 11/02/2020   HGBA1C 6.6 (H) 06/01/2022   MICROALBUR <0.7 07/26/2022       Assessment & Plan:   Problem List Items Addressed This Visit     Chest pain    Reproducible with movement and palpation.  Appeared episode - more msk.  Tylenol.  Has been taking ibuprofen.  Limit dose.  Follow.  Call with update.        Diabetes mellitus (HCC)    Low-carb diet and exercise given elevated sugar.  Follow metabolic panel and A1c.      Relevant Orders   Urine Microalbumin w/creat. ratio (Completed)   Elevated alkaline phosphatase level    Saw GI.  Ultrasound - fatty liver.  Diet and exercise.  Follow liver panel.       Essential hypertension, benign    Continues on Coreg, losartan and amlodipine.  Blood pressure still elevated.  Blood pressure (outside readings) - mid to high 130s/80s.  Increase coreg to 12.5mg  bid. Have  her to continue to spot check her pressure.  Follow.  Get her back in soon to reassess.  Follow metabolic panel.       Relevant Medications   carvedilol (COREG) 12.5 MG tablet   GERD (gastroesophageal reflux disease)    Continue protonix.       Hypercholesterolemia    Low-cholesterol diet and exercise.  Follow lipid panel.      Relevant Medications   carvedilol (COREG) 12.5 MG tablet   Lesion of adrenal gland (HCC)    Consistent with adrenal adenoma.  Work-up unrevealing.  Saw Dr. Gershon Crane.  Recommended follow-up in 1 year. Overdue f/u. Need to schedule.       Swelling of lower extremity    Continue compression hose/socks.  Has been wearing compression hose regularly. Swelling stable. Follow.       Other Visit Diagnoses     Encounter for screening mammogram for malignant neoplasm of breast    -  Primary   Relevant Orders   MM 3D SCREEN BREAST BILATERAL        Dale [place], MD

## 2022-07-27 LAB — MICROALBUMIN / CREATININE URINE RATIO
Creatinine,U: 65.7 mg/dL
Microalb Creat Ratio: 1.1 mg/g (ref 0.0–30.0)
Microalb, Ur: <0.7 mg/dL (ref 0.0–1.9)

## 2022-07-31 ENCOUNTER — Encounter: Payer: Self-pay | Admitting: Internal Medicine

## 2022-07-31 ENCOUNTER — Telehealth: Payer: Self-pay | Admitting: Internal Medicine

## 2022-07-31 NOTE — Assessment & Plan Note (Signed)
Consistent with adrenal adenoma.  Work-up unrevealing.  Saw Dr. O'Connell.  Recommended follow-up in 1 year. Overdue f/u. Need to schedule.  

## 2022-07-31 NOTE — Assessment & Plan Note (Signed)
Continues on Coreg, losartan and amlodipine.  Blood pressure still elevated.  Blood pressure (outside readings) - mid to high 130s/80s.  Increase coreg to 12.5mg  bid. Have her to continue to spot check her pressure.  Follow.  Get her back in soon to reassess.  Follow metabolic panel.

## 2022-07-31 NOTE — Assessment & Plan Note (Signed)
Reproducible with movement and palpation.  Appeared episode - more msk.  Tylenol.  Has been taking ibuprofen.  Limit dose.  Follow.  Call with update.

## 2022-07-31 NOTE — Assessment & Plan Note (Signed)
Continue protonix  

## 2022-07-31 NOTE — Telephone Encounter (Signed)
In reviewing chart, she previously saw Dr Gershon Crane (endocrinology) for adrenal lesion.  He had recommended f/up.  Is overdue.  Need to schedule f/u if pt agreeable.  [facility] endocrinology).

## 2022-07-31 NOTE — Assessment & Plan Note (Signed)
Low-carb diet and exercise given elevated sugar.  Follow metabolic panel and A1c. 

## 2022-07-31 NOTE — Assessment & Plan Note (Signed)
Continue compression hose/socks.  Has been wearing compression hose regularly. Swelling stable. Follow.

## 2022-07-31 NOTE — Assessment & Plan Note (Signed)
Low cholesterol diet and exercise.  Follow lipid panel.   

## 2022-07-31 NOTE — Assessment & Plan Note (Signed)
Saw GI.  Ultrasound - fatty liver.  Diet and exercise.  Follow liver panel.  

## 2022-08-11 ENCOUNTER — Other Ambulatory Visit: Payer: Self-pay | Admitting: Internal Medicine

## 2022-08-16 NOTE — Telephone Encounter (Signed)
Pt sched for 12/27 at 115pm LM to advise pt

## 2022-08-23 ENCOUNTER — Other Ambulatory Visit: Payer: Self-pay | Admitting: Internal Medicine

## 2022-08-25 ENCOUNTER — Telehealth: Payer: Self-pay

## 2022-08-25 NOTE — Telephone Encounter (Signed)
Lm for patient to ask if she has had previous sleep study or of she wears cpap.

## 2022-08-25 NOTE — Telephone Encounter (Signed)
Noted  

## 2022-08-26 ENCOUNTER — Encounter: Payer: Self-pay | Admitting: Pulmonary Disease

## 2022-08-26 ENCOUNTER — Ambulatory Visit (INDEPENDENT_AMBULATORY_CARE_PROVIDER_SITE_OTHER): Payer: BC Managed Care – PPO | Admitting: Pulmonary Disease

## 2022-08-26 VITALS — BP 130/84 | HR 82 | Temp 98.0°F | Ht 60.0 in | Wt 167.2 lb

## 2022-08-26 DIAGNOSIS — R0683 Snoring: Secondary | ICD-10-CM | POA: Diagnosis not present

## 2022-08-26 NOTE — Progress Notes (Signed)
Deborah Bartlett, Critical Care, and Sleep Medicine  Chief Complaint  Patient presents with   sleep consult    No prior sleep study- loud snoring and daytime sleepiness.     Past Surgical History:  She  has a past surgical history that includes Cesarean section ([place]); Cardiac surgery (Sept 2007); Colonoscopy (2014); Shoulder arthroscopy with rotator cuff repair (Right, 02/21/2018); and ORIF elbow fracture (Left, 05/26/2020).  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 female with snoring.      Subjective:   She has noticed trouble with her sleep for years.  Her family says she snores and is a restless sleeper.  She wakes up at night with a dry mouth and feels her throat closing sometimes.  She gets leg cramps a few times per week.  She falls asleep during the day while watching TV or working on a computer.  She goes to sleep at 10 pm.  She falls asleep in less than 30 minutes.  She wakes up 2 times to use the bathroom.  She gets out of bed at 4:45 am on weekdays, and 9 am on weekends.  She feels tired in the morning.  She has been getting headaches more frequently during the day.  She does not use anything to help her fall sleep or stay awake.  She denies sleep walking, sleep talking, bruxism, or nightmares.  There is no history of restless legs.  She denies sleep hallucinations, sleep paralysis, or cataplexy.  The Epworth score is 18 out of 24.   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 She has never used smokeless tobacco. She reports that she does not drink alcohol and does not use drugs.  Family History:  Her family history includes Diabetes in her maternal grandfather, maternal grandmother, and mother; Hypertension in her mother.    Discussion:  She has snoring, sleep disruption, apnea, and daytime sleepiness.  She has a history of hypertension.  I am concerned she could have obstructive sleep apnea.  Assessment/Plan:   Snoring with excessive daytime sleepiness. - will need to arrange for a home sleep study  Obesity. - discussed how weight can impact sleep and risk for sleep disordered breathing - discussed options to assist with weight loss: combination of diet modification, cardiovascular and strength training exercises  Cardiovascular risk. - had an extensive discussion regarding the adverse health consequences related to untreated sleep disordered breathing -  specifically discussed the risks for hypertension, coronary artery disease, cardiac dysrhythmias, cerebrovascular disease, and diabetes - lifestyle modification discussed  Safe driving practices. - discussed how sleep disruption can increase risk of accidents, particularly when driving - safe driving practices were discussed  Therapies for obstructive sleep apnea. - if the sleep study shows significant sleep apnea, then various therapies for treatment were reviewed: CPAP, oral appliance, and surgical interventions  Time Spent Involved in Patient Care on Day of Examination:  35  minutes  Follow up:   Patient Instructions  Will arrange for a home sleep study Will call to arrange for follow up after sleep study reviewed   Medication List:   Allergies as of 08/26/2022       Reactions   Codeine Palpitations   dizzy        Medication List        Accurate as of August 26, 2022  9:05 AM. If you have any questions, ask your nurse or doctor.          amLODipine 5 MG tablet Commonly known as: NORVASC TAKE 1 TABLET BY MOUTH TWICE A DAY   aspirin 81 MG tablet Take 81 mg by mouth daily.   carvedilol 12.5 MG tablet Commonly known as: COREG Take 1 tablet (12.5 mg total) by mouth 2 (two) times daily with a meal.   cyanocobalamin 1000 MCG tablet Commonly known as: VITAMIN B12 Take 1,000 mcg by mouth daily.   ferrous sulfate 325 (65 FE) MG tablet Take 325 mg by mouth daily with breakfast.   losartan 100 MG tablet Commonly known as: COZAAR TAKE 1 TABLET BY MOUTH EVERY DAY   Magnesium 250 MG Tabs Take 250 mg by mouth at bedtime.   onetouch ultrasoft lancets Use as instructed to check Blood sugars 3-4 times a week, twice a day.  Dispense One Touch brand. E11.9   OneTouch Verio test strip Generic drug: glucose blood CHECK BLOOD SUGAR TWICE A DAY 3 TO 4 TIMES A WEEK   pantoprazole 40 MG tablet Commonly known as: PROTONIX TAKE 1 TABLET (40 MG TOTAL) BY MOUTH DAILY TAKE 30 MINUTES BEFORE BREAKFAST   rosuvastatin 40 MG tablet Commonly known as: CRESTOR TAKE 1 TABLET BY MOUTH EVERY DAY   solifenacin 5 MG tablet Commonly known as: VESICARE Take 1 tablet (5 mg total) by mouth daily.   vitamin D3 25 MCG tablet Commonly known as: CHOLECALCIFEROL Take 1,000 Units by mouth daily.        Signature:  Chesley Mires, MD [place] Pager - [phone] 08/26/2022, 9:05 AM

## 2022-08-26 NOTE — Patient Instructions (Signed)
Will arrange for a home sleep study Will call to arrange for follow up after sleep study reviewed  

## 2022-08-31 ENCOUNTER — Other Ambulatory Visit: Payer: Self-pay | Admitting: Internal Medicine

## 2022-09-14 ENCOUNTER — Ambulatory Visit
Admission: RE | Admit: 2022-09-14 | Discharge: 2022-09-14 | Disposition: A | Discharge status: Home / Self Care | Payer: BC Managed Care – PPO | Source: Ambulatory Visit | Attending: Internal Medicine | Admitting: Internal Medicine

## 2022-09-14 DIAGNOSIS — Z1231 Encounter for screening mammogram for malignant neoplasm of breast: Secondary | ICD-10-CM | POA: Insufficient documentation

## 2022-09-17 ENCOUNTER — Other Ambulatory Visit: Payer: Self-pay | Admitting: Internal Medicine

## 2022-09-29 ENCOUNTER — Ambulatory Visit: Payer: BC Managed Care – PPO | Admitting: Internal Medicine

## 2022-10-06 ENCOUNTER — Ambulatory Visit: Payer: BC Managed Care – PPO | Admitting: Internal Medicine

## 2022-10-06 ENCOUNTER — Encounter: Payer: Self-pay | Admitting: Internal Medicine

## 2022-10-06 VITALS — BP 126/74 | HR 89 | Temp 98.0°F | Resp 16 | Ht 60.0 in | Wt 164.0 lb

## 2022-10-06 DIAGNOSIS — R059 Cough, unspecified: Secondary | ICD-10-CM

## 2022-10-06 DIAGNOSIS — K219 Gastro-esophageal reflux disease without esophagitis: Secondary | ICD-10-CM

## 2022-10-06 DIAGNOSIS — E1165 Type 2 diabetes mellitus with hyperglycemia: Secondary | ICD-10-CM | POA: Diagnosis not present

## 2022-10-06 DIAGNOSIS — R748 Abnormal levels of other serum enzymes: Secondary | ICD-10-CM

## 2022-10-06 DIAGNOSIS — U071 COVID-19: Secondary | ICD-10-CM

## 2022-10-06 DIAGNOSIS — E7800 Pure hypercholesterolemia, unspecified: Secondary | ICD-10-CM | POA: Diagnosis not present

## 2022-10-06 DIAGNOSIS — E279 Disorder of adrenal gland, unspecified: Secondary | ICD-10-CM

## 2022-10-06 DIAGNOSIS — I10 Essential (primary) hypertension: Secondary | ICD-10-CM | POA: Diagnosis not present

## 2022-10-06 LAB — POC COVID19 BINAXNOW: SARS Coronavirus 2 Ag: POSITIVE — AB

## 2022-10-06 MED ORDER — SOLIFENACIN SUCCINATE 5 MG PO TABS: 5.0000 mg | ORAL_TABLET | Freq: Every day | ORAL | 1 refills | Status: DC

## 2022-10-06 MED ORDER — ONDANSETRON HCL 4 MG PO TABS: 4.0000 mg | ORAL_TABLET | Freq: Three times a day (TID) | ORAL | 0 refills | Status: DC | PRN

## 2022-10-06 MED ORDER — LOSARTAN POTASSIUM 100 MG PO TABS: 100.0000 mg | ORAL_TABLET | Freq: Every day | ORAL | 1 refills | Status: DC

## 2022-10-06 NOTE — Progress Notes (Signed)
Subjective:    Patient ID: Deborah Bartlett, female    DOB: 1960/10/24, 62 y.o.   MRN: [id number]  Patient here for  Chief Complaint  Patient presents with   Medical Management of Chronic Issues    HPI Here to follow up diabetes, hypertension and lower extremity swelling.  Last visit increased coreg to 12.'5mg'$  bid due to continued elevated blood pressures.  Blood pressure doing better.  Tolerating medication.  Does report starting two days ago, developed sore throat.  Sore throat is better now.  Increased nasal congestion and head congestion.  Some nausea. Does not feel good.  Discussed covid testing and quarantine. . No increased sob.  No chest pain reported.  Needs f/u with Dr Honor Junes - f/u adrenal adenoma.  Saw pulmonary - Dr Halford Chessman.  Recommended HST.   Past Medical History:  Diagnosis Date   Diabetes mellitus without complication ([place])    GERD (gastroesophageal reflux disease)    Hypercholesteremia    Hypertension    Migraine    irregular, rare   Perimenopausal 2014   Past Surgical History:  Procedure Laterality Date   CARDIAC SURGERY  Sept 2007   birth defect/Scimitar Syndrome   [place]  2014   Dr Bary Castilla   ORIF ELBOW FRACTURE Left 05/26/2020   Procedure: OPEN REDUCTION INTERNAL FIXATION (ORIF) ELBOW/OLECRANON FRACTURE;  Surgeon: Corky Mull, MD;  Location: [facility];  Service: Orthopedics;  Laterality: Left;   SHOULDER ARTHROSCOPY WITH ROTATOR CUFF REPAIR Right 02/21/2018   Procedure: SHOULDER ARTHROSCOPY WITH DEBRIDEMENT DECOMPRESSION AND REPAIR OF ROTATOR CUFF TEAR;  Surgeon: Corky Mull, MD;  Location: [place];  Service: Orthopedics;  Laterality: Right;  Diabetic - diet controlled   Family History  Problem Relation Age of Onset   Hypertension Mother    Diabetes Mother    Diabetes Maternal Grandmother    Diabetes Maternal Grandfather    Breast cancer Neg Hx    Colon cancer Neg Hx    Social History    Socioeconomic History   Marital status: Married    Spouse name: Not on file   Number of children: Not on file   Years of education: Not on file   Highest education level: Not on file  Occupational History   Not on file  Tobacco Use   Smoking status: Never   Smokeless tobacco: Never  Vaping Use   Vaping Use: Never used  Substance and Sexual Activity   Alcohol use: No    Alcohol/week: 0.0 standard drinks of alcohol   Drug use: No   Sexual activity: Not on file  Other Topics Concern   Not on file  Social History Narrative   Not on file   Social Determinants of Health   Financial Resource Strain: Not on file  Food Insecurity: Not on file  Transportation Needs: Not on file  Physical Activity: Not on file  Stress: Not on file  Social Connections: Not on file     Review of Systems  Constitutional:  Negative for appetite change.       No documented fever.   HENT:  Positive for congestion and sore throat.        Nasal congestion.   Respiratory:  Negative for chest tightness.        No increased cough.  Some cough.  No sob.   Cardiovascular:  Negative for chest pain and palpitations.  Gastrointestinal:  Positive for nausea. Negative for abdominal pain,  diarrhea and vomiting.  Genitourinary:  Negative for difficulty urinating and dysuria.  Musculoskeletal:  Negative for joint swelling and myalgias.  Skin:  Negative for color change and rash.  Neurological:  Negative for dizziness and headaches.  Psychiatric/Behavioral:  Negative for agitation and dysphoric mood.        Objective:     BP 126/74   Pulse 89   Temp 98 F (36.7 C)   Resp 16   Ht 5' (1.524 m)   Wt 164 lb (74.4 kg)   LMP 02/13/2013   SpO2 98%   BMI 32.03 kg/m  Wt Readings from Last 3 Encounters:  10/06/22 164 lb (74.4 kg)  08/26/22 167 lb 3.2 oz (75.8 kg)  07/26/22 165 lb 3.2 oz (74.9 kg)    Physical Exam Vitals reviewed.  Constitutional:      General: She is not in acute distress.     Appearance: Normal appearance.  HENT:     Head: Normocephalic and atraumatic.     Right Ear: External ear normal.     Left Ear: External ear normal.  Eyes:     General: No scleral icterus.       Right eye: No discharge.        Left eye: No discharge.     Conjunctiva/sclera: Conjunctivae normal.  Neck:     Thyroid: No thyromegaly.  Cardiovascular:     Rate and Rhythm: Normal rate and regular rhythm.  Pulmonary:     Effort: No respiratory distress.     Breath sounds: Normal breath sounds. No wheezing.  Abdominal:     General: Bowel sounds are normal.     Palpations: Abdomen is soft.     Tenderness: There is no abdominal tenderness.  Musculoskeletal:        General: No swelling or tenderness.     Cervical back: Neck supple. No tenderness.  Lymphadenopathy:     Cervical: No cervical adenopathy.  Skin:    Findings: No erythema or rash.  Neurological:     Mental Status: She is alert.  Psychiatric:        Mood and Affect: Mood normal.        Behavior: Behavior normal.      Outpatient Encounter Medications as of 10/06/2022  Medication Sig   ondansetron (ZOFRAN) 4 MG tablet Take 1 tablet (4 mg total) by mouth every 8 (eight) hours as needed for nausea or vomiting.   amLODipine (NORVASC) 5 MG tablet TAKE 1 TABLET BY MOUTH TWICE A DAY   aspirin 81 MG tablet Take 81 mg by mouth daily.   carvedilol (COREG) 12.5 MG tablet Take 1 tablet (12.5 mg total) by mouth 2 (two) times daily with a meal.   ferrous sulfate 325 (65 FE) MG tablet Take 325 mg by mouth daily with breakfast.   Lancets (ONETOUCH ULTRASOFT) lancets Use as instructed to check Blood sugars 3-4 times a week, twice a day.  Dispense One Touch brand. E11.9   losartan (COZAAR) 100 MG tablet Take 1 tablet (100 mg total) by mouth daily.   Magnesium 250 MG TABS Take 250 mg by mouth at bedtime.   ONETOUCH VERIO test strip CHECK BLOOD SUGAR TWICE A DAY 3 TO 4 TIMES A WEEK   pantoprazole (PROTONIX) 40 MG tablet TAKE 1 TABLET (40 MG  TOTAL) BY MOUTH DAILY TAKE 30 MINUTES BEFORE BREAKFAST   rosuvastatin (CRESTOR) 40 MG tablet TAKE 1 TABLET BY MOUTH EVERY DAY   solifenacin (VESICARE) 5 MG tablet Take 1  tablet (5 mg total) by mouth daily.   vitamin B-12 (CYANOCOBALAMIN) 1000 MCG tablet Take 1,000 mcg by mouth daily.   Vitamin D3 (VITAMIN D) 25 MCG tablet Take 1,000 Units by mouth daily.   [DISCONTINUED] losartan (COZAAR) 100 MG tablet TAKE 1 TABLET BY MOUTH EVERY DAY   [DISCONTINUED] solifenacin (VESICARE) 5 MG tablet Take 1 tablet (5 mg total) by mouth daily.   No facility-administered encounter medications on file as of 10/06/2022.     Lab Results  Component Value Date   WBC 4.6 06/01/2022   HGB 12.9 06/01/2022   HCT 39.0 06/01/2022   PLT 251.0 06/01/2022   GLUCOSE 109 (H) 06/01/2022   CHOL 185 06/01/2022   TRIG 198.0 (H) 06/01/2022   HDL 48.00 06/01/2022   LDLDIRECT 134.0 10/16/2018   LDLCALC 97 06/01/2022   ALT 20 06/01/2022   AST 16 06/01/2022   NA 140 06/01/2022   K 4.6 06/01/2022   CL 105 06/01/2022   CREATININE 0.92 06/01/2022   BUN 19 06/01/2022   CO2 29 06/01/2022   TSH 3.13 06/01/2022   INR 1.0 11/02/2020   HGBA1C 6.6 (H) 06/01/2022   MICROALBUR <0.7 07/26/2022    MM 3D SCREEN BREAST BILATERAL  Result Date: 09/16/2022 CLINICAL DATA:  Screening. EXAM: DIGITAL SCREENING BILATERAL MAMMOGRAM WITH TOMOSYNTHESIS AND CAD TECHNIQUE: Bilateral screening digital craniocaudal and mediolateral oblique mammograms were obtained. Bilateral screening digital breast tomosynthesis was performed. The images were evaluated with computer-aided detection. COMPARISON:  Previous exam(s). ACR Breast Density Category b: There are scattered areas of fibroglandular density. FINDINGS: There are no findings suspicious for malignancy. IMPRESSION: No mammographic evidence of malignancy. A result letter of this screening mammogram will be mailed directly to the patient. RECOMMENDATION: Screening mammogram in one year.  (Code:[id number]) BI-RADS CATEGORY  1: Negative. Electronically Signed   By: Lillia Mountain M.D.   On: 09/16/2022 10:16       Assessment & Plan:  Type 2 diabetes mellitus with hyperglycemia, without long-term current use of insulin (HCC) Assessment & Plan: Low-carb diet and exercise given elevated sugar.  Follow metabolic panel and 123456.  Orders: -     Hemoglobin A1c; Future  Essential hypertension, benign Assessment & Plan: Continues on Coreg, losartan and amlodipine. Increase coreg to 12.'5mg'$  bid last visit.  Does appear to be some better. Have her to continue to spot check her pressure.  Follow.  Treat current infection.  Follow metabolic panel.   Orders: -     Basic metabolic panel; Future  Hypercholesterolemia Assessment & Plan: Low-cholesterol diet and exercise.  Follow lipid panel.  Orders: -     Lipid panel; Future -     Hepatic function panel; Future  Cough, unspecified type -     POC COVID-19 BinaxNow  Elevated alkaline phosphatase level Assessment & Plan: Saw GI.  Ultrasound - fatty liver.  Diet and exercise.  Follow liver panel.    Gastroesophageal reflux disease, unspecified whether esophagitis present Assessment & Plan: Continue protonix.    Lesion of adrenal gland [facility]) Assessment & Plan: Consistent with adrenal adenoma.  Work-up unrevealing.  Saw Dr. Honor Junes.  Recommended follow-up in 1 year. Overdue f/u. Need to schedule.    COVID-19 virus infection Assessment & Plan: Symptoms as outlined.  POC covid test - positive.  Discussed covid and quarantine guidelines.  Discussed oral antiviral medication.  She wants to hold on oral antiviral treatment.  Treat symptoms.  Robitussin DM to help with cough and congestion.  Zofran for nausea.  Note written to be out of work.  Follow symptoms.  Call with update.    Other orders -     Losartan Potassium; Take 1 tablet (100 mg total) by mouth daily.  Dispense: 90 tablet; Refill: 1 -     Solifenacin Succinate; Take 1  tablet (5 mg total) by mouth daily.  Dispense: 90 tablet; Refill: 1 -     Ondansetron HCl; Take 1 tablet (4 mg total) by mouth every 8 (eight) hours as needed for nausea or vomiting.  Dispense: 15 tablet; Refill: 0     Einar Pheasant, MD

## 2022-10-06 NOTE — Patient Instructions (Signed)
Robitussin DM   I have sent in the zofran for nausea.

## 2022-10-16 ENCOUNTER — Encounter: Payer: Self-pay | Admitting: Internal Medicine

## 2022-10-16 DIAGNOSIS — U071 COVID-19: Secondary | ICD-10-CM | POA: Insufficient documentation

## 2022-10-16 NOTE — Assessment & Plan Note (Signed)
Saw GI.  Ultrasound - fatty liver.  Diet and exercise.  Follow liver panel.

## 2022-10-16 NOTE — Assessment & Plan Note (Signed)
Symptoms as outlined.  POC covid test - positive.  Discussed covid and quarantine guidelines.  Discussed oral antiviral medication.  She wants to hold on oral antiviral treatment.  Treat symptoms.  Robitussin DM to help with cough and congestion.  Zofran for nausea.  Note written to be out of work.  Follow symptoms.  Call with update.

## 2022-10-16 NOTE — Assessment & Plan Note (Signed)
Continue protonix  

## 2022-10-16 NOTE — Assessment & Plan Note (Signed)
Low-carb diet and exercise given elevated sugar.  Follow metabolic panel and [id number].

## 2022-10-16 NOTE — Assessment & Plan Note (Signed)
Low cholesterol diet and exercise.  Follow lipid panel.   

## 2022-10-16 NOTE — Assessment & Plan Note (Signed)
Continues on Coreg, losartan and amlodipine. Increase coreg to 12.'5mg'$  bid last visit.  Does appear to be some better. Have her to continue to spot check her pressure.  Follow.  Treat current infection.  Follow metabolic panel.

## 2022-10-16 NOTE — Assessment & Plan Note (Signed)
Consistent with adrenal adenoma.  Work-up unrevealing.  Saw Dr. Honor Junes.  Recommended follow-up in 1 year. Overdue f/u. Need to schedule.

## 2022-10-17 ENCOUNTER — Ambulatory Visit: Payer: BC Managed Care – PPO

## 2022-10-17 DIAGNOSIS — G4733 Obstructive sleep apnea (adult) (pediatric): Secondary | ICD-10-CM

## 2022-10-17 DIAGNOSIS — R0683 Snoring: Secondary | ICD-10-CM

## 2022-10-21 ENCOUNTER — Telehealth: Payer: Self-pay | Admitting: Pulmonary Disease

## 2022-10-21 DIAGNOSIS — G4733 Obstructive sleep apnea (adult) (pediatric): Secondary | ICD-10-CM

## 2022-10-21 NOTE — Telephone Encounter (Signed)
Mychart message with results and request to call office to schedule a follow up visit sent.

## 2022-10-21 NOTE — Telephone Encounter (Signed)
HST 10/17/22 >> AHI 13.2, SpO2 low 65%   Please inform her that her sleep study shows mild obstructive sleep apnea.  Please arrange for ROV with me or NP to discuss treatment options.

## 2022-10-25 NOTE — Telephone Encounter (Signed)
I spoke with the patient and scheduled her an appt for 11/11/2022 at 2:00pm. I offered a sooner appt at our [facility] office but she wanted to stay in [place].  Nothing further needed.

## 2022-10-25 NOTE — Telephone Encounter (Signed)
Patient is returning phone call. Patient phone number is [phone].

## 2022-11-11 ENCOUNTER — Encounter: Payer: Self-pay | Admitting: Primary Care

## 2022-11-11 ENCOUNTER — Ambulatory Visit: Payer: BC Managed Care – PPO | Admitting: Primary Care

## 2022-11-11 VITALS — BP 130/70 | HR 82 | Temp 97.6°F | Ht 60.0 in | Wt 162.4 lb

## 2022-11-11 DIAGNOSIS — G4730 Sleep apnea, unspecified: Secondary | ICD-10-CM

## 2022-11-11 DIAGNOSIS — G4733 Obstructive sleep apnea (adult) (pediatric): Secondary | ICD-10-CM | POA: Diagnosis not present

## 2022-11-11 NOTE — Assessment & Plan Note (Signed)
-   Home sleep study on 10/17/2022 showed mild obstructive sleep apnea, average AHI 13.2 an hour with SpO2 low 66%.  We reviewed risks of untreated sleep apnea and treatment options.  Due to moderate to severe oxygen saturations recommending patient be started on CPAP, patient is in agreement with plan. We have placed an order for patient to be started on auto CPAP 5 to 15 cm H2O with mask of choice. Advised she aim to wear CPAP every night for 4 to 6 hours or longer.  Encourage side sleeping position and weight loss efforts.  Follow-up 31 to 90 days after CPAP start for compliance check.

## 2022-11-11 NOTE — Progress Notes (Signed)
@Patient  ID: Deborah Bartlett, female    DOB: [birth year], 62 y.o.   MRN: [id number]  Chief Complaint  Patient presents with   Follow-up    Review HST    Referring provider: Einar Pheasant, MD  HPI: 62 year old female, never smoked. PMH significant for hypertension, diabetes, endometriosis.  11/11/2022 Presents today to review sleep study results.  Patient had sleep consult with Dr. Halford Chessman on 08/26/2022 for snoring symptoms.  She wakes up frequently due to nocturia.  She has associated daytime sleepiness.  She had a home sleep study on 10/17/22 that showed evidence of mild obstructive sleep apnea, average AHI 13.2 an hour with SpO2 low 66%.  We reviewed sleep study results today, risks of untreated sleep apnea and treatment options. Due to moderate oxygen saturations recommending she be started on CPAP. She is open to starting PAP therapy.   Allergies  Allergen Reactions   Codeine Palpitations    dizzy    Immunization History  Administered Date(s) Administered   Moderna Sars-Covid-2 Vaccination 10/19/2019, 11/16/2019, 08/19/2020   Tdap 11/02/2020    Past Medical History:  Diagnosis Date   Diabetes mellitus without complication ([place])    GERD (gastroesophageal reflux disease)    Hypercholesteremia    Hypertension    Migraine    irregular, rare   Perimenopausal 2014    Tobacco History: Social History   Tobacco Use  Smoking Status Never  Smokeless Tobacco Never   Counseling given: Not Answered   Outpatient Medications Prior to Visit  Medication Sig Dispense Refill   amLODipine (NORVASC) 5 MG tablet TAKE 1 TABLET BY MOUTH TWICE A DAY 180 tablet 3   aspirin 81 MG tablet Take 81 mg by mouth daily.     carvedilol (COREG) 12.5 MG tablet Take 1 tablet (12.5 mg total) by mouth 2 (two) times daily with a meal. 60 tablet 3   ferrous sulfate 325 (65 FE) MG tablet Take 325 mg by mouth daily with breakfast.     Lancets (ONETOUCH ULTRASOFT) lancets Use as instructed to check Blood  sugars 3-4 times a week, twice a day.  Dispense One Touch brand. E11.9 100 each 12   losartan (COZAAR) 100 MG tablet Take 1 tablet (100 mg total) by mouth daily. 90 tablet 1   Magnesium 250 MG TABS Take 250 mg by mouth at bedtime.     ondansetron (ZOFRAN) 4 MG tablet Take 1 tablet (4 mg total) by mouth every 8 (eight) hours as needed for nausea or vomiting. 15 tablet 0   ONETOUCH VERIO test strip CHECK BLOOD SUGAR TWICE A DAY 3 TO 4 TIMES A WEEK 100 strip 12   pantoprazole (PROTONIX) 40 MG tablet TAKE 1 TABLET (40 MG TOTAL) BY MOUTH DAILY TAKE 30 MINUTES BEFORE BREAKFAST 90 tablet 1   rosuvastatin (CRESTOR) 40 MG tablet TAKE 1 TABLET BY MOUTH EVERY DAY 90 tablet 3   solifenacin (VESICARE) 5 MG tablet Take 1 tablet (5 mg total) by mouth daily. 90 tablet 1   vitamin B-12 (CYANOCOBALAMIN) 1000 MCG tablet Take 1,000 mcg by mouth daily.     Vitamin D3 (VITAMIN D) 25 MCG tablet Take 1,000 Units by mouth daily.     No facility-administered medications prior to visit.    Review of Systems  Review of Systems  Constitutional: Negative.   HENT: Negative.    Respiratory: Negative.    Cardiovascular: Negative.   Psychiatric/Behavioral:  Positive for sleep disturbance.     Physical Exam  BP 130/70 (BP Location:  Left Arm, Cuff Size: Normal)   Pulse 82   Temp 97.6 F (36.4 C) (Temporal)   Ht 5' (1.524 m)   Wt 162 lb 6.4 oz (73.7 kg)   LMP 02/13/2013   SpO2 96%   BMI 31.72 kg/m  Physical Exam Constitutional:      Appearance: Normal appearance.  HENT:     Head: Normocephalic and atraumatic.     Mouth/Throat:     Mouth: Mucous membranes are moist.     Pharynx: Oropharynx is clear.  Cardiovascular:     Rate and Rhythm: Normal rate.  Pulmonary:     Effort: Pulmonary effort is normal.  Neurological:     General: No focal deficit present.     Mental Status: She is alert and oriented to person, place, and time. Mental status is at baseline.  Psychiatric:        Mood and Affect: Mood  normal.        Behavior: Behavior normal.        Thought Content: Thought content normal.        Judgment: Judgment normal.      Lab Results:  CBC    Component Value Date/Time   WBC 4.6 06/01/2022 0847   RBC 4.32 06/01/2022 0847   HGB 12.9 06/01/2022 0847   HGB 11.3 (L) 12/05/2014 0736   HCT 39.0 06/01/2022 0847   HCT 34.5 (L) 12/05/2014 0736   PLT 251.0 06/01/2022 0847   PLT 319 12/05/2014 0736   MCV 90.4 06/01/2022 0847   MCV 87 12/05/2014 0736   MCH 29.5 11/02/2020 2049   MCHC 32.9 06/01/2022 0847   RDW 14.1 06/01/2022 0847   RDW 14.3 12/05/2014 0736   LYMPHSABS 1.5 06/01/2022 0847   LYMPHSABS 1.9 12/05/2014 0736   MONOABS 0.5 06/01/2022 0847   MONOABS 0.6 12/05/2014 0736   EOSABS 0.2 06/01/2022 0847   EOSABS 0.3 12/05/2014 0736   BASOSABS 0.1 06/01/2022 0847   BASOSABS 0.0 12/05/2014 0736    BMET    Component Value Date/Time   NA 140 06/01/2022 0847   NA 141 10/31/2018 0849   NA 140 12/05/2014 0736   K 4.6 06/01/2022 0847   K 3.8 12/06/2014 1053   CL 105 06/01/2022 0847   CL 104 12/05/2014 0736   CO2 29 06/01/2022 0847   CO2 28 12/05/2014 0736   GLUCOSE 109 (H) 06/01/2022 0847   GLUCOSE 121 (H) 12/05/2014 0736   BUN 19 06/01/2022 0847   BUN 17 10/31/2018 0849   BUN 15 12/05/2014 0736   CREATININE 0.92 06/01/2022 0847   CREATININE 0.81 12/06/2014 1053   CALCIUM 10.5 06/01/2022 0847   CALCIUM 8.9 12/05/2014 0736   GFRNONAA >60 11/02/2020 2049   GFRNONAA >60 12/06/2014 1053   GFRAA 76 10/31/2018 0849   GFRAA >60 12/06/2014 1053    BNP No results found for: "BNP"  ProBNP No results found for: "PROBNP"  Imaging: No results found.   Assessment & Plan:   Sleep apnea - Home sleep study on 10/17/2022 showed mild obstructive sleep apnea, average AHI 13.2 an hour with SpO2 low 66%.  We reviewed risks of untreated sleep apnea and treatment options.  Due to moderate to severe oxygen saturations recommending patient be started on CPAP, patient is in  agreement with plan. We have placed an order for patient to be started on auto CPAP 5 to 15 cm H2O with mask of choice. Advised she aim to wear CPAP every night for 4 to 6 hours  or longer.  Encourage side sleeping position and weight loss efforts.  Follow-up 31 to 90 days after CPAP start for compliance check.  Martyn Ehrich, NP 11/11/2022

## 2022-11-11 NOTE — Patient Instructions (Addendum)
Home sleep study showed mild to moderate obstructive sleep apnea, you had on average 13 apneic events an hour  Due to moderate oxygen desaturations and clinical symptoms recommending you be started on CPAP  Aim to wear CPAP nightly for 4 to 6 hours or longer  Encourage side sleeping position   Orders: CPAP 5 to 15 cm H2O with mask of choice  Follow-up: 31 to 90 days after starting CPAP for compliance check   CPAP and BIPAP Information CPAP and BIPAP are methods that use air pressure to keep your airways open and to help you breathe well. CPAP and BIPAP use different amounts of pressure. Your health care provider will tell you whether CPAP or BIPAP would be more helpful for you. CPAP stands for "continuous positive airway pressure." With CPAP, the amount of pressure stays the same while you breathe in (inhale) and out (exhale). BIPAP stands for "bi-level positive airway pressure." With BIPAP, the amount of pressure will be higher when you inhale and lower when you exhale. This allows you to take larger breaths. CPAP or BIPAP may be used in the hospital, or your health care provider may want you to use it at home. You may need to have a sleep study before your health care provider can order a machine for you to use at home. What are the advantages? CPAP or BIPAP can be helpful if you have: Sleep apnea. Chronic obstructive pulmonary disease (COPD). Heart failure. Medical conditions that cause muscle weakness, including muscular dystrophy or amyotrophic lateral sclerosis (ALS). Other problems that cause breathing to be shallow, weak, abnormal, or difficult. CPAP and BIPAP are most commonly used for obstructive sleep apnea (OSA) to keep the airways from collapsing when the muscles relax during sleep. What are the risks? Generally, this is a safe treatment. However, problems may occur, including: Irritated skin or skin sores if the mask does not fit properly. Dry or stuffy nose or  nosebleeds. Dry mouth. Feeling gassy or bloated. Sinus or lung infection if the equipment is not cleaned properly. When should CPAP or BIPAP be used? In most cases, the mask only needs to be worn during sleep. Generally, the mask needs to be worn throughout the night and during any daytime naps. People with certain medical conditions may also need to wear the mask at other times, such as when they are awake. Follow instructions from your health care provider about when to use the machine. What happens during CPAP or BIPAP?  Both CPAP and BIPAP are provided by a small machine with a flexible plastic tube that attaches to a plastic mask that you wear. Air is blown through the mask into your nose or mouth. The amount of pressure that is used to blow the air can be adjusted on the machine. Your health care provider will set the pressure setting and help you find the best mask for you. Tips for using the mask Because the mask needs to be snug, some people feel trapped or closed-in (claustrophobic) when first using the mask. If you feel this way, you may need to get used to the mask. One way to do this is to hold the mask loosely over your nose or mouth and then gradually apply the mask more snugly. You can also gradually increase the amount of time that you use the mask. Masks are available in various types and sizes. If your mask does not fit well, talk with your health care provider about getting a different one. Some common  types of masks include: Full face masks, which fit over the mouth and nose. Nasal masks, which fit over the nose. Nasal pillow or prong masks, which fit into the nostrils. If you are using a mask that fits over your nose and you tend to breathe through your mouth, a chin strap may be applied to help keep your mouth closed. Use a skin barrier to protect your skin as told by your health care provider. Some CPAP and BIPAP machines have alarms that may sound if the mask comes off or  develops a leak. If you have trouble with the mask, it is very important that you talk with your health care provider about finding a way to make the mask easier to tolerate. Do not stop using the mask. There could be a negative impact on your health if you stop using the mask. Tips for using the machine Place your CPAP or BIPAP machine on a secure table or stand near an electrical outlet. Know where the on/off switch is on the machine. Follow instructions from your health care provider about how to set the pressure on your machine and when you should use it. Do not eat or drink while the CPAP or BIPAP machine is on. Food or fluids could get pushed into your lungs by the pressure of the CPAP or BIPAP. For home use, CPAP and BIPAP machines can be rented or purchased through home health care companies. Many different brands of machines are available. Renting a machine before purchasing may help you find out which particular machine works well for you. Your health insurance company may also decide which machine you may get. Keep the CPAP or BIPAP machine and attachments clean. Ask your health care provider for specific instructions. Check the humidifier if you have a dry stuffy nose or nosebleeds. Make sure it is working correctly. Follow these instructions at home: Take over-the-counter and prescription medicines only as told by your health care provider. Ask if you can take sinus medicine if your sinuses are blocked. Do not use any products that contain nicotine or tobacco. These products include cigarettes, chewing tobacco, and vaping devices, such as e-cigarettes. If you need help quitting, ask your health care provider. Keep all follow-up visits. This is important. Contact a health care provider if: You have redness or pressure sores on your head, face, mouth, or nose from the mask or head gear. You have trouble using the CPAP or BIPAP machine. You cannot tolerate wearing the CPAP or BIPAP  mask. Someone tells you that you snore even when wearing your CPAP or BIPAP. Get help right away if: You have trouble breathing. You feel confused. Summary CPAP and BIPAP are methods that use air pressure to keep your airways open and to help you breathe well. If you have trouble with the mask, it is very important that you talk with your health care provider about finding a way to make the mask easier to tolerate. Do not stop using the mask. There could be a negative impact to your health if you stop using the mask. Follow instructions from your health care provider about when to use the machine. This information is not intended to replace advice given to you by your health care provider. Make sure you discuss any questions you have with your health care provider. Document Revised: 03/17/2021 Document Reviewed: 07/17/2020 Elsevier Patient Education  [place].

## 2022-11-12 NOTE — Progress Notes (Signed)
Reviewed and agree with assessment/plan.   Chesley Mires, MD [facility] Pulmonary/Critical Care 11/12/2022, 7:00 AM Pager:  [phone]
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
## 2022-12-15 ENCOUNTER — Encounter (INDEPENDENT_AMBULATORY_CARE_PROVIDER_SITE_OTHER): Payer: BC Managed Care – PPO | Admitting: Vascular Surgery

## 2022-12-21 ENCOUNTER — Encounter: Payer: Self-pay | Admitting: Internal Medicine

## 2022-12-22 NOTE — Telephone Encounter (Signed)
Ok

## 2022-12-22 NOTE — Telephone Encounter (Signed)
Ok to do another handicap form?

## 2022-12-23 NOTE — Telephone Encounter (Signed)
Placed out for signature 

## 2022-12-27 ENCOUNTER — Other Ambulatory Visit: Payer: Self-pay | Admitting: Internal Medicine

## 2023-02-24 ENCOUNTER — Other Ambulatory Visit: Payer: Self-pay | Admitting: Internal Medicine

## 2023-02-28 ENCOUNTER — Telehealth: Payer: Self-pay | Admitting: Internal Medicine

## 2023-02-28 NOTE — Telephone Encounter (Signed)
LMTCB

## 2023-02-28 NOTE — Telephone Encounter (Signed)
Patient returned Trisha's phone call. 

## 2023-02-28 NOTE — Telephone Encounter (Signed)
Patient returned call from Rita Ohara, LPN.  I was unable to reach Keller at the time of the call.  I let patient know that I will send her a message to let her know she returned her call.

## 2023-03-01 ENCOUNTER — Ambulatory Visit: Payer: BC Managed Care – PPO | Admitting: Internal Medicine

## 2023-03-01 VITALS — BP 118/70 | HR 74 | Temp 97.9°F | Resp 16 | Ht 60.0 in | Wt 161.0 lb

## 2023-03-01 DIAGNOSIS — I10 Essential (primary) hypertension: Secondary | ICD-10-CM | POA: Diagnosis not present

## 2023-03-01 DIAGNOSIS — R748 Abnormal levels of other serum enzymes: Secondary | ICD-10-CM

## 2023-03-01 DIAGNOSIS — E1165 Type 2 diabetes mellitus with hyperglycemia: Secondary | ICD-10-CM

## 2023-03-01 DIAGNOSIS — G4730 Sleep apnea, unspecified: Secondary | ICD-10-CM

## 2023-03-01 DIAGNOSIS — K219 Gastro-esophageal reflux disease without esophagitis: Secondary | ICD-10-CM

## 2023-03-01 DIAGNOSIS — E279 Disorder of adrenal gland, unspecified: Secondary | ICD-10-CM

## 2023-03-01 DIAGNOSIS — E7800 Pure hypercholesterolemia, unspecified: Secondary | ICD-10-CM

## 2023-03-01 DIAGNOSIS — M7989 Other specified soft tissue disorders: Secondary | ICD-10-CM

## 2023-03-01 NOTE — Telephone Encounter (Signed)
Will tell patient to disregard message

## 2023-03-01 NOTE — Progress Notes (Signed)
Subjective:    Patient ID: Deborah Bartlett, female    DOB: 1960/10/23, 62 y.o.   MRN: [id number]  Patient here for  Chief Complaint  Patient presents with   Medical Management of Chronic Issues    CPAP compliance    HPI Here to follow up diabetes, hypertension and lower extremity swelling. Recent HST revealed mild obstructive sleep apnea.  Recommended CPAP. Using cpap regularly.  Averaging 6-8 hours per night.  Tolerating.  Sleeping better.  Feels better. Benefits from regular cpap use. Saw endocrinology 12/07/22 - f/u adrenal adenoma.  Labs - no evidence of adrenal excess. Blood pressures averaging 120s/70-80. Still with persistent swelling.  Wearing compression hose.  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   CESAREAN SECTION  1990, 1993, 1999   COLONOSCOPY  2014   Dr Lemar Livings   ORIF ELBOW FRACTURE Left 05/26/2020   Procedure: OPEN REDUCTION INTERNAL FIXATION (ORIF) ELBOW/OLECRANON FRACTURE;  Surgeon: Christena Flake, MD;  Location: [facility];  Service: Orthopedics;  Laterality: Left;   SHOULDER ARTHROSCOPY WITH ROTATOR CUFF REPAIR Right 02/21/2018   Procedure: SHOULDER ARTHROSCOPY WITH DEBRIDEMENT DECOMPRESSION AND REPAIR OF ROTATOR CUFF TEAR;  Surgeon: Christena Flake, MD;  Location: [facility];  Service: Orthopedics;  Laterality: Right;  Diabetic - diet controlled   Family History  Problem Relation Age of Onset   Hypertension Mother    Diabetes Mother    Diabetes Maternal Grandmother    Diabetes Maternal Grandfather    Breast cancer Neg Hx    Colon cancer Neg Hx    Social History    Socioeconomic History   Marital status: Married    Spouse name: Not on file   Number of children: Not on file   Years of education: Not on file   Highest education level: 12th grade  Occupational History   Not on file  Tobacco Use   Smoking status: Never   Smokeless tobacco: Never  Vaping Use   Vaping status: Never Used  Substance and Sexual Activity   Alcohol use: No    Alcohol/week: 0.0 standard drinks of alcohol   Drug use: No   Sexual activity: Not on file  Other Topics Concern   Not on file  Social History Narrative   Not on file   Social Determinants of Health   Financial Resource Strain: Low Risk  (03/01/2023)   Overall Financial Resource Strain (CARDIA)    Difficulty of Paying Living Expenses: Not hard at all  Food Insecurity: Unknown (03/01/2023)   Hunger Vital Sign    Worried About Running Out of Food in the Last Year: Never true    Ran Out of Food in the Last Year: Patient declined  Transportation Needs: No Transportation Needs (03/01/2023)   PRAPARE - Administrator, Civil Service (Medical): No    Lack of Transportation (Non-Medical): No  Physical Activity: Insufficiently Active (03/01/2023)   Exercise Vital Sign    Days of Exercise per Week: 2 days    Minutes of Exercise per Session: 20 min  Stress: No Stress Concern Present (03/01/2023)  [employer] of Occupational Health - Occupational Stress Questionnaire    Feeling of Stress : Only a little  Social Connections: Socially Integrated (03/01/2023)   Social Connection and Isolation Panel [NHANES]    Frequency of Communication with Friends and Family: More than three times a week    Frequency of Social Gatherings with Friends and Family: Three times a week    Attends Religious Services: More than 4 times per year    Active Member of Clubs or Organizations: Yes    Attends Banker Meetings: 1 to 4 times per year    Marital Status: Married     Review of Systems   Constitutional:  Negative for appetite change and unexpected weight change.  HENT:  Negative for congestion and sinus pressure.   Respiratory:  Negative for cough, chest tightness and shortness of breath.   Cardiovascular:  Negative for chest pain and palpitations.       Increased lower extremity swelling - persistent - despite compression hose.   Gastrointestinal:  Negative for abdominal pain, diarrhea, nausea and vomiting.  Genitourinary:  Negative for difficulty urinating and dysuria.  Musculoskeletal:  Negative for joint swelling and myalgias.  Skin:  Negative for color change and rash.  Neurological:  Negative for dizziness and headaches.  Psychiatric/Behavioral:  Negative for agitation and dysphoric mood.        Objective:     BP 118/70   Pulse 74   Temp 97.9 F (36.6 C)   Resp 16   Ht 5' (1.524 m)   Wt 161 lb (73 kg)   LMP 02/13/2013   SpO2 98%   BMI 31.44 kg/m  Wt Readings from Last 3 Encounters:  03/01/23 161 lb (73 kg)  11/11/22 162 lb 6.4 oz (73.7 kg)  10/06/22 164 lb (74.4 kg)    Physical Exam Vitals reviewed.  Constitutional:      General: She is not in acute distress.    Appearance: Normal appearance.  HENT:     Head: Normocephalic and atraumatic.     Right Ear: External ear normal.     Left Ear: External ear normal.  Eyes:     General: No scleral icterus.       Right eye: No discharge.        Left eye: No discharge.     Conjunctiva/sclera: Conjunctivae normal.  Neck:     Thyroid: No thyromegaly.  Cardiovascular:     Rate and Rhythm: Normal rate and regular rhythm.  Pulmonary:     Effort: No respiratory distress.     Breath sounds: Normal breath sounds. No wheezing.  Abdominal:     General: Bowel sounds are normal.     Palpations: Abdomen is soft.     Tenderness: There is no abdominal tenderness.  Musculoskeletal:        General: No tenderness.     Cervical back: Neck supple. No tenderness.     Comments: Pedal/lower extremity swelling -  compression hose in place.   Lymphadenopathy:     Cervical: No cervical adenopathy.  Skin:    Findings: No erythema or rash.  Neurological:     Mental Status: She is alert.  Psychiatric:        Mood and Affect: Mood normal.        Behavior: Behavior normal.      Outpatient Encounter Medications as of 03/01/2023  Medication Sig   amLODipine (NORVASC) 5 MG tablet TAKE 1 TABLET BY MOUTH TWICE A DAY  aspirin 81 MG tablet Take 81 mg by mouth daily.   carvedilol (COREG) 12.5 MG tablet TAKE 1 TABLET (12.5MG  TOTAL) BY MOUTH TWICE A DAY WITH MEALS   ferrous sulfate 325 (65 FE) MG tablet Take 325 mg by mouth daily with breakfast.   Lancets (ONETOUCH ULTRASOFT) lancets Use as instructed to check Blood sugars 3-4 times a week, twice a day.  Dispense One Touch brand. E11.9   losartan (COZAAR) 100 MG tablet Take 1 tablet (100 mg total) by mouth daily.   Magnesium 250 MG TABS Take 250 mg by mouth at bedtime.   ondansetron (ZOFRAN) 4 MG tablet Take 1 tablet (4 mg total) by mouth every 8 (eight) hours as needed for nausea or vomiting.   ONETOUCH VERIO test strip CHECK BLOOD SUGAR TWICE A DAY 3 TO 4 TIMES A WEEK   pantoprazole (PROTONIX) 40 MG tablet TAKE 1 TABLET (40 MG TOTAL) BY MOUTH DAILY TAKE 30 MINUTES BEFORE BREAKFAST   rosuvastatin (CRESTOR) 40 MG tablet TAKE 1 TABLET BY MOUTH EVERY DAY   solifenacin (VESICARE) 5 MG tablet Take 1 tablet (5 mg total) by mouth daily.   vitamin B-12 (CYANOCOBALAMIN) 1000 MCG tablet Take 1,000 mcg by mouth daily.   Vitamin D3 (VITAMIN D) 25 MCG tablet Take 1,000 Units by mouth daily.   No facility-administered encounter medications on file as of 03/01/2023.     Lab Results  Component Value Date   WBC 4.6 06/01/2022   HGB 12.9 06/01/2022   HCT 39.0 06/01/2022   PLT 251.0 06/01/2022   GLUCOSE 109 (H) 06/01/2022   CHOL 185 06/01/2022   TRIG 198.0 (H) 06/01/2022   HDL 48.00 06/01/2022   LDLDIRECT 134.0 10/16/2018   LDLCALC 97 06/01/2022   ALT 20 06/01/2022    AST 16 06/01/2022   NA 140 06/01/2022   K 4.6 06/01/2022   CL 105 06/01/2022   CREATININE 0.92 06/01/2022   BUN 19 06/01/2022   CO2 29 06/01/2022   TSH 3.13 06/01/2022   INR 1.0 11/02/2020   HGBA1C 6.6 (H) 06/01/2022   MICROALBUR <0.7 07/26/2022    MM 3D SCREEN BREAST BILATERAL  Result Date: 09/16/2022 CLINICAL DATA:  Screening. EXAM: DIGITAL SCREENING BILATERAL MAMMOGRAM WITH TOMOSYNTHESIS AND CAD TECHNIQUE: Bilateral screening digital craniocaudal and mediolateral oblique mammograms were obtained. Bilateral screening digital breast tomosynthesis was performed. The images were evaluated with computer-aided detection. COMPARISON:  Previous exam(s). ACR Breast Density Category b: There are scattered areas of fibroglandular density. FINDINGS: There are no findings suspicious for malignancy. IMPRESSION: No mammographic evidence of malignancy. A result letter of this screening mammogram will be mailed directly to the patient. RECOMMENDATION: Screening mammogram in one year. (Code:SM-B-01Y) BI-RADS CATEGORY  1: Negative. Electronically Signed   By: Baird Lyons M.D.   On: 09/16/2022 10:16       Assessment & Plan:  Type 2 diabetes mellitus with hyperglycemia, without long-term current use of insulin (HCC) Assessment & Plan: Low-carb diet and exercise given elevated sugar.  Follow metabolic panel and A1c.   Elevated alkaline phosphatase level Assessment & Plan: Saw GI.  Ultrasound - fatty liver.  Diet and exercise.  Follow liver panel.    Essential hypertension, benign Assessment & Plan: Continues on Coreg, losartan and amlodipine. On coreg 12.5mg  bid. Blood pressures as outlined. Pressures better. Have her to continue to spot check her pressure.  Follow. Follow metabolic panel.    Gastroesophageal reflux disease, unspecified whether esophagitis present Assessment & Plan: Continue protonix.    Hypercholesterolemia  Assessment & Plan: Low-cholesterol diet and exercise.  Follow  lipid panel.   Lesion of adrenal gland [facility]) Assessment & Plan: Consistent with adrenal adenoma.  Work-up unrevealing.  Seeing Dr Val EagleElveria Rising. Saw endocrinology 12/07/22 - f/u adrenal adenoma.  Labs - no evidence of adrenal excess.    Sleep apnea, unspecified type Assessment & Plan:  Recent HST revealed mild obstructive sleep apnea.  Recommended CPAP. Using cpap regularly.  Averaging 6-8 hours per night.  Tolerating.  Sleeping better.  Feels better. Benefits from regular cpap use.    Swelling of lower extremity Assessment & Plan: Persistent lower extremity swelling.  Wearing compression hose regularly.  Refer to [facility] for further evaluation and treatment.   Orders: -     Ambulatory referral to Vascular Surgery     Dale [place], MD

## 2023-03-06 ENCOUNTER — Encounter: Payer: Self-pay | Admitting: Internal Medicine

## 2023-03-06 NOTE — Assessment & Plan Note (Addendum)
Continues on Coreg, losartan and amlodipine. On coreg 12.5mg  bid. Blood pressures as outlined. Pressures better. Have her to continue to spot check her pressure.  Follow. Follow metabolic panel.

## 2023-03-06 NOTE — Assessment & Plan Note (Signed)
Consistent with adrenal adenoma.  Work-up unrevealing.  Seeing Dr Val EagleElveria Rising. Saw endocrinology 12/07/22 - f/u adrenal adenoma.  Labs - no evidence of adrenal excess.

## 2023-03-06 NOTE — Assessment & Plan Note (Signed)
Low-carb diet and exercise given elevated sugar.  Follow metabolic panel and A1c. 

## 2023-03-06 NOTE — Assessment & Plan Note (Signed)
Persistent lower extremity swelling.  Wearing compression hose regularly.  Refer to [facility] for further evaluation and treatment.

## 2023-03-06 NOTE — Assessment & Plan Note (Signed)
 Saw GI.  Ultrasound - fatty liver.  Diet and exercise.  Follow liver panel.

## 2023-03-06 NOTE — Assessment & Plan Note (Signed)
Recent HST revealed mild obstructive sleep apnea.  Recommended CPAP. Using cpap regularly.  Averaging 6-8 hours per night.  Tolerating.  Sleeping better.  Feels better. Benefits from regular cpap use.

## 2023-03-06 NOTE — Assessment & Plan Note (Signed)
 Low cholesterol diet and exercise.  Follow lipid panel.   

## 2023-03-06 NOTE — Assessment & Plan Note (Signed)
 Continue protonix  

## 2023-03-09 ENCOUNTER — Other Ambulatory Visit: Payer: BC Managed Care – PPO

## 2023-03-09 ENCOUNTER — Other Ambulatory Visit (INDEPENDENT_AMBULATORY_CARE_PROVIDER_SITE_OTHER): Payer: BC Managed Care – PPO

## 2023-03-09 DIAGNOSIS — I10 Essential (primary) hypertension: Secondary | ICD-10-CM | POA: Diagnosis not present

## 2023-03-09 DIAGNOSIS — E1165 Type 2 diabetes mellitus with hyperglycemia: Secondary | ICD-10-CM | POA: Diagnosis not present

## 2023-03-09 DIAGNOSIS — E7800 Pure hypercholesterolemia, unspecified: Secondary | ICD-10-CM | POA: Diagnosis not present

## 2023-03-09 LAB — LIPID PANEL
Cholesterol: 171 mg/dL (ref 0–200)
HDL: 48.7 mg/dL (ref >39.00)
NonHDL: 122.43
Total CHOL/HDL Ratio: 4
Triglycerides: 214 mg/dL — ABNORMAL HIGH (ref 0.0–149.0)
VLDL: 42.8 mg/dL — ABNORMAL HIGH (ref 0.0–40.0)

## 2023-03-09 LAB — BASIC METABOLIC PANEL
BUN: 21 mg/dL (ref 6–23)
CO2: 28 mEq/L (ref 19–32)
Calcium: 10.5 mg/dL (ref 8.4–10.5)
Chloride: 106 mEq/L (ref 96–112)
Creatinine, Ser: 0.92 mg/dL (ref 0.40–1.20)
GFR: 67.53 mL/min (ref >60.00)
Glucose, Bld: 97 mg/dL (ref 70–99)
Potassium: 4 mEq/L (ref 3.5–5.1)
Sodium: 141 mEq/L (ref 135–145)

## 2023-03-09 LAB — HEPATIC FUNCTION PANEL
ALT: 20 U/L (ref 0–35)
AST: 16 U/L (ref 0–37)
Albumin: 4.4 g/dL (ref 3.5–5.2)
Alkaline Phosphatase: 117 U/L (ref 39–117)
Bilirubin, Direct: 0.1 mg/dL (ref 0.0–0.3)
Total Bilirubin: 0.4 mg/dL (ref 0.2–1.2)
Total Protein: 7.2 g/dL (ref 6.0–8.3)

## 2023-03-09 LAB — HEMOGLOBIN A1C: Hgb A1c MFr Bld: 6.3 % (ref 4.6–6.5)

## 2023-03-09 LAB — LDL CHOLESTEROL, DIRECT: Direct LDL: 83 mg/dL

## 2023-05-25 ENCOUNTER — Other Ambulatory Visit: Payer: Self-pay | Admitting: Internal Medicine

## 2023-05-31 ENCOUNTER — Other Ambulatory Visit: Payer: Self-pay | Admitting: Internal Medicine

## 2023-06-07 ENCOUNTER — Other Ambulatory Visit: Payer: Self-pay | Admitting: Internal Medicine

## 2023-06-13 ENCOUNTER — Encounter: Payer: Self-pay | Admitting: Internal Medicine

## 2023-06-14 ENCOUNTER — Encounter: Payer: Self-pay | Admitting: Family Medicine

## 2023-06-14 ENCOUNTER — Ambulatory Visit: Payer: BC Managed Care – PPO | Admitting: Family Medicine

## 2023-06-14 ENCOUNTER — Encounter: Payer: Self-pay | Admitting: Internal Medicine

## 2023-06-14 ENCOUNTER — Ambulatory Visit
Admission: RE | Admit: 2023-06-14 | Discharge: 2023-06-14 | Disposition: A | Discharge status: Home / Self Care | Payer: BC Managed Care – PPO | Source: Ambulatory Visit | Attending: Family Medicine | Admitting: Family Medicine

## 2023-06-14 VITALS — BP 126/84 | HR 107 | Temp 98.3°F | Ht 60.0 in | Wt 158.4 lb

## 2023-06-14 DIAGNOSIS — M79605 Pain in left leg: Secondary | ICD-10-CM

## 2023-06-14 DIAGNOSIS — M7989 Other specified soft tissue disorders: Secondary | ICD-10-CM | POA: Insufficient documentation

## 2023-06-14 MED ORDER — CEPHALEXIN 500 MG PO CAPS: 500.0000 mg | ORAL_CAPSULE | Freq: Four times a day (QID) | ORAL | 0 refills | Status: DC

## 2023-06-14 NOTE — Assessment & Plan Note (Signed)
Concern for cellulitis versus DVT.  We will proceed with Keflex 500 mg every 6 hours for 1 week.  We will order an ultrasound to evaluate for DVT.  Discussed risk of diarrhea with the Keflex.  Advised treatment would change if she does have a DVT.  She will be scheduled for follow-up with another provider in 2 days for recheck of her leg.  Advised to seek medical attention for chest pain, shortness of breath, worsening swelling, or spreading redness while on antibiotics.

## 2023-06-14 NOTE — Telephone Encounter (Signed)
Pt scheduled with Dr Caryl Bis

## 2023-06-14 NOTE — Progress Notes (Signed)
Marikay Alar, MD Phone: [phone]  Deborah Bartlett is a 62 y.o. female who presents today for same-day visit.  Left lower extremity swelling and erythema: Patient notes this started on 06/11/2023.  She has had no appetite.  She threw up on Monday.  She has had headache.  She notes her left lower extremity is red and warm to the touch.  She had fever on Sunday.  She notes a history of cellulitis in the past going back to 2016.  They note this seems to flare once a year for about a day and then goes away on its own.  She notes a blister on her left heel as well.  She denies chest pain and shortness of breath.  Social History   Tobacco Use  Smoking Status Never  Smokeless Tobacco Never    Current Outpatient Medications on File Prior to Visit  Medication Sig Dispense Refill   amLODipine (NORVASC) 5 MG tablet TAKE 1 TABLET BY MOUTH TWICE A DAY 180 tablet 3   aspirin 81 MG tablet Take 81 mg by mouth daily.     carvedilol (COREG) 12.5 MG tablet TAKE 1 TABLET (12.5MG  TOTAL) BY MOUTH TWICE A DAY WITH MEALS 180 tablet 1   ferrous sulfate 325 (65 FE) MG tablet Take 325 mg by mouth daily with breakfast.     Lancets (ONETOUCH ULTRASOFT) lancets Use as instructed to check Blood sugars 3-4 times a week, twice a day.  Dispense One Touch brand. E11.9 100 each 12   losartan (COZAAR) 100 MG tablet TAKE 1 TABLET BY MOUTH EVERY DAY 90 tablet 1   Magnesium 250 MG TABS Take 250 mg by mouth at bedtime.     ondansetron (ZOFRAN) 4 MG tablet Take 1 tablet (4 mg total) by mouth every 8 (eight) hours as needed for nausea or vomiting. 15 tablet 0   ONETOUCH VERIO test strip CHECK BLOOD SUGAR TWICE A DAY 3 TO 4 TIMES A WEEK 100 strip 12   pantoprazole (PROTONIX) 40 MG tablet TAKE 1 TABLET (40 MG TOTAL) BY MOUTH DAILY TAKE 30 MINUTES BEFORE BREAKFAST 90 tablet 1   rosuvastatin (CRESTOR) 40 MG tablet TAKE 1 TABLET BY MOUTH EVERY DAY 90 tablet 3   solifenacin (VESICARE) 5 MG tablet TAKE 1 TABLET (5 MG TOTAL) BY  MOUTH DAILY. 90 tablet 1   vitamin B-12 (CYANOCOBALAMIN) 1000 MCG tablet Take 1,000 mcg by mouth daily.     Vitamin D3 (VITAMIN D) 25 MCG tablet Take 1,000 Units by mouth daily.     No current facility-administered medications on file prior to visit.     ROS see history of present illness  Objective  Physical Exam Vitals:   06/14/23 1103 06/14/23 1112  BP: 128/86 126/84  Pulse: (!) 107   Temp: 98.3 F (36.8 C)   SpO2: 98%     BP Readings from Last 3 Encounters:  06/14/23 126/84  03/01/23 118/70  11/11/22 130/70   Wt Readings from Last 3 Encounters:  06/14/23 158 lb 6.4 oz (71.8 kg)  03/01/23 161 lb (73 kg)  11/11/22 162 lb 6.4 oz (73.7 kg)    Physical Exam Constitutional:      General: She is not in acute distress.    Appearance: She is not diaphoretic.  Cardiovascular:     Rate and Rhythm: Regular rhythm. Tachycardia present.     Heart sounds: Normal heart sounds.  Pulmonary:     Effort: Pulmonary effort is normal.     Breath sounds: Normal  breath sounds.  Skin:    General: Skin is warm and dry.  Neurological:     Mental Status: She is alert.     Area slightly tender, no warmth in this area    Areas of erythema are warm to touch, patient does have calf tenderness, negative Homans  Assessment/Plan: Please see individual problem list.  Pain and swelling of left lower extremity Assessment & Plan: Concern for cellulitis versus DVT.  We will proceed with Keflex 500 mg every 6 hours for 1 week.  We will order an ultrasound to evaluate for DVT.  Discussed risk of diarrhea with the Keflex.  Advised treatment would change if she does have a DVT.  She will be scheduled for follow-up with another provider in 2 days for recheck of her leg.  Advised to seek medical attention for chest pain, shortness of breath, worsening swelling, or spreading redness while on antibiotics.  Orders: -     US Venous Img Lower Unilateral Left (DVT); Future -     Cephalexin; Take 1  capsule (500 mg total) by mouth 4 (four) times daily.  Dispense: 28 capsule; Refill: 0   Return in about 2 days (around 06/16/2023) for With any available provider for recheck of left lower extremity possible cellulitis.   Marikay Alar, MD [facility]

## 2023-06-14 NOTE — Patient Instructions (Signed)
Nice to see you. We will contact you with your ultrasound results. Please start on the Keflex to cover for possible cellulitis. If you develop chest pain, shortness of breath, worsening swelling, or spreading redness while on the antibiotics or if you start to feel progressively worse please go to the emergency room.

## 2023-06-16 ENCOUNTER — Encounter: Payer: Self-pay | Admitting: Nurse Practitioner

## 2023-06-16 ENCOUNTER — Ambulatory Visit: Payer: BC Managed Care – PPO | Admitting: Nurse Practitioner

## 2023-06-16 VITALS — BP 118/68 | HR 80 | Temp 97.7°F | Ht 60.0 in | Wt 160.0 lb

## 2023-06-16 DIAGNOSIS — L03116 Cellulitis of left lower limb: Secondary | ICD-10-CM

## 2023-06-16 NOTE — Telephone Encounter (Signed)
Ok for handicap tag.  Make sure has f/u appt with me.

## 2023-06-16 NOTE — Telephone Encounter (Signed)
Ok to complete handicap form.

## 2023-06-16 NOTE — Progress Notes (Signed)
Bethanie Dicker, NP-C Phone: [phone]  Deborah Bartlett is a 62 y.o. female who presents today for left leg pain.   Patient evaluated 2 days ago for left lower extremity pain and swelling. She had an ultrasound the same day that was negative for a DVT. She was started on Keflex 500 mg every 6 hours for concern for cellulitis. Her left leg was noted to be red and warm to the touch. She also had a large blister on her left heel. Today she presents with continued left lower extremity swelling, it has not worsened. Her pain and redness has been improving. She is able to bear weight on her leg. The blister on her heel has gotten slightly larger and has started leaking fluid. Denies fevers. Denies chest pain and shortness of breath.   Social History   Tobacco Use  Smoking Status Never  Smokeless Tobacco Never    Current Outpatient Medications on File Prior to Visit  Medication Sig Dispense Refill   amLODipine (NORVASC) 5 MG tablet TAKE 1 TABLET BY MOUTH TWICE A DAY 180 tablet 3   aspirin 81 MG tablet Take 81 mg by mouth daily.     carvedilol (COREG) 12.5 MG tablet TAKE 1 TABLET (12.5MG  TOTAL) BY MOUTH TWICE A DAY WITH MEALS 180 tablet 1   cephALEXin (KEFLEX) 500 MG capsule Take 1 capsule (500 mg total) by mouth 4 (four) times daily. 28 capsule 0   ferrous sulfate 325 (65 FE) MG tablet Take 325 mg by mouth daily with breakfast.     Lancets (ONETOUCH ULTRASOFT) lancets Use as instructed to check Blood sugars 3-4 times a week, twice a day.  Dispense One Touch brand. E11.9 100 each 12   losartan (COZAAR) 100 MG tablet TAKE 1 TABLET BY MOUTH EVERY DAY 90 tablet 1   Magnesium 250 MG TABS Take 250 mg by mouth at bedtime.     ondansetron (ZOFRAN) 4 MG tablet Take 1 tablet (4 mg total) by mouth every 8 (eight) hours as needed for nausea or vomiting. 15 tablet 0   ONETOUCH VERIO test strip CHECK BLOOD SUGAR TWICE A DAY 3 TO 4 TIMES A WEEK 100 strip 12   pantoprazole (PROTONIX) 40 MG tablet TAKE 1 TABLET  (40 MG TOTAL) BY MOUTH DAILY TAKE 30 MINUTES BEFORE BREAKFAST 90 tablet 1   rosuvastatin (CRESTOR) 40 MG tablet TAKE 1 TABLET BY MOUTH EVERY DAY 90 tablet 3   solifenacin (VESICARE) 5 MG tablet TAKE 1 TABLET (5 MG TOTAL) BY MOUTH DAILY. 90 tablet 1   vitamin B-12 (CYANOCOBALAMIN) 1000 MCG tablet Take 1,000 mcg by mouth daily.     Vitamin D3 (VITAMIN D) 25 MCG tablet Take 1,000 Units by mouth daily.     No current facility-administered medications on file prior to visit.    ROS see history of present illness  Objective  Physical Exam Vitals:   06/16/23 1454  BP: 118/68  Pulse: 80  Temp: 97.7 F (36.5 C)  SpO2: 97%    BP Readings from Last 3 Encounters:  06/16/23 118/68  06/14/23 126/84  03/01/23 118/70   Wt Readings from Last 3 Encounters:  06/16/23 160 lb (72.6 kg)  06/14/23 158 lb 6.4 oz (71.8 kg)  03/01/23 161 lb (73 kg)    Physical Exam Constitutional:      General: She is not in acute distress.    Appearance: Normal appearance.  HENT:     Head: Normocephalic.  Cardiovascular:     Rate and Rhythm:  Normal rate and regular rhythm.     Heart sounds: Normal heart sounds.  Pulmonary:     Effort: Pulmonary effort is normal.     Breath sounds: Normal breath sounds.  Skin:    General: Skin is warm and dry.  Neurological:     General: No focal deficit present.     Mental Status: She is alert.  Psychiatric:        Mood and Affect: Mood normal.        Behavior: Behavior normal.    No erythema or warmth noted in left lower extremity. Blister as pictured above, mild tenderness present.   Assessment/Plan: Please see individual problem list.  There are no diagnoses linked to this encounter.   Return in 4 days (on 06/20/2023) for recheck of left lower leg.   Bethanie Dicker, NP-C [facility] [employer]

## 2023-06-16 NOTE — Assessment & Plan Note (Signed)
Improving. She will continue her Keflex 500 mg every 6 hours as prescribed. Advised to elevate legs when sitting. Strict return precautions given to patient. She will return to care in 4 days for a recheck of her leg before being cleared to return to work.

## 2023-06-19 NOTE — Telephone Encounter (Signed)
Signed and placed in box.   

## 2023-06-19 NOTE — Telephone Encounter (Signed)
Placed in quick sign folder. Pt has a follow-up appt scheduled next month.

## 2023-06-20 ENCOUNTER — Encounter: Payer: Self-pay | Admitting: Internal Medicine

## 2023-06-20 ENCOUNTER — Ambulatory Visit (INDEPENDENT_AMBULATORY_CARE_PROVIDER_SITE_OTHER): Payer: BC Managed Care – PPO | Admitting: Internal Medicine

## 2023-06-20 VITALS — BP 122/76 | HR 82 | Ht 60.0 in | Wt 158.8 lb

## 2023-06-20 DIAGNOSIS — I890 Lymphedema, not elsewhere classified: Secondary | ICD-10-CM | POA: Diagnosis not present

## 2023-06-20 DIAGNOSIS — L03116 Cellulitis of left lower limb: Secondary | ICD-10-CM | POA: Diagnosis not present

## 2023-06-20 DIAGNOSIS — L02419 Cutaneous abscess of limb, unspecified: Secondary | ICD-10-CM

## 2023-06-20 DIAGNOSIS — L03119 Cellulitis of unspecified part of limb: Secondary | ICD-10-CM

## 2023-06-20 MED ORDER — DOXYCYCLINE HYCLATE 100 MG PO TABS: 100.0000 mg | ORAL_TABLET | Freq: Two times a day (BID) | ORAL | 0 refills | Status: DC

## 2023-06-20 MED ORDER — ONDANSETRON HCL 4 MG PO TABS: 4.0000 mg | ORAL_TABLET | Freq: Three times a day (TID) | ORAL | 0 refills | Status: DC | PRN

## 2023-06-20 NOTE — Assessment & Plan Note (Addendum)
Dorsum of foot remains red and warm despite use of keflex since oct 23.  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  Changing abx to doxycycline .  Advised to resume pumping of LE twice daily using lymphedema pumps

## 2023-06-20 NOTE — Telephone Encounter (Signed)
Placed up front for pickup ° °

## 2023-06-20 NOTE — Assessment & Plan Note (Addendum)
Involving the left lower extremity,   diagnosis has not been formally made but she has lymphedema pumps from an unknown supplier/provider that she has not used in 4 years, andas a result has recurrent cellulitis of the LE>  advised to resume pumping.  Referral to [facility] in July has not materialized;  patient has returned their call twice and not heard back.

## 2023-06-20 NOTE — Patient Instructions (Addendum)
Changing antibiotic  no more keflex .  Start doxycycline with food.    Please take a probiotic ( Align, Floraque or Culturelle), the generic version of one of these over the counter medications,Yogurt, for a minimum of 3 weeks  ANY TIME YOU ARE TREATED WITH AN ANTIBIOTIIC to prevent a serious antibiotic associated diarrhea  Called clostridium dificile colitis.  Taking a probiotic may also prevent vaginitis due to yeast infections and can be continued indefinitely if you feel that it improves your digestion or your elimination (bowels).   Start pumping the left leg twice daily for an hour,  the blister will leak  !  So change the dressing whenever it feels moist

## 2023-06-20 NOTE — Progress Notes (Signed)
Subjective:  Patient ID: Deborah Bartlett, female    DOB: 1961/06/29  Age: 62 y.o. MRN: [id number]  CC: The primary encounter diagnosis was Lymphedema. Diagnoses of Cellulitis and abscess of leg and Cellulitis of left lower extremity were also pertinent to this visit.   HPI [name] presents for  Chief Complaint  Patient presents with   Wound Check   62 yr old female ith history of OSA and chronic  LE swelling managed with compression knee highs  (pending referral to [facility] made in July 2024  for evaluation :  CVI vs lymphedema)  presents for follow up on cellulitis.  Patient developed fever , nausea,  and redness of LLE on Oct 20.  Seen by ES and ruled  out for DVT ; was  treated for cellulitis of the left lower extremity with cephalexin prescribed on Oct 23 by Dr Art Buff.  Noted to have developed a large bulla on medial heel left leg. THAT STARTED AS A BRUISE (PICTURE TOOK PHOTOS)   She was seen  by NP Catalina Antigua on Oct 25 and cellulitis was considered  to be resolving (no warmth or redness) , but because of the  persistent bulla on the medial aspect of her left heel,  she was advised to return in 4 days.    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  And  apparently been diagnosed with lymphedema of the left lower extremity years ago (she is unaware of the diagnosis) and has lymphedema pumps THAT SHE HAS NOT USED IN 3-4 YEARS.     Outpatient Medications Prior to Visit  Medication Sig Dispense Refill   amLODipine (NORVASC) 5 MG tablet TAKE 1 TABLET BY MOUTH TWICE A DAY 180 tablet 3   aspirin 81 MG tablet Take 81 mg by mouth daily.     carvedilol (COREG) 12.5 MG tablet TAKE 1 TABLET (12.5MG  TOTAL) BY MOUTH TWICE A DAY WITH MEALS 180 tablet 1   cephALEXin (KEFLEX) 500 MG capsule Take 1 capsule (500 mg total) by mouth 4 (four) times daily. 28 capsule 0   ferrous sulfate 325 (65 FE) MG tablet Take 325 mg by mouth daily with breakfast.     Lancets  (ONETOUCH ULTRASOFT) lancets Use as instructed to check Blood sugars 3-4 times a week, twice a day.  Dispense One Touch brand. E11.9 100 each 12   losartan (COZAAR) 100 MG tablet TAKE 1 TABLET BY MOUTH EVERY DAY 90 tablet 1   Magnesium 250 MG TABS Take 250 mg by mouth at bedtime.     ONETOUCH VERIO test strip CHECK BLOOD SUGAR TWICE A DAY 3 TO 4 TIMES A WEEK 100 strip 12   pantoprazole (PROTONIX) 40 MG tablet TAKE 1 TABLET (40 MG TOTAL) BY MOUTH DAILY TAKE 30 MINUTES BEFORE BREAKFAST 90 tablet 1   rosuvastatin (CRESTOR) 40 MG tablet TAKE 1 TABLET BY MOUTH EVERY DAY 90 tablet 3   solifenacin (VESICARE) 5 MG tablet TAKE 1 TABLET (5 MG TOTAL) BY MOUTH DAILY. 90 tablet 1   vitamin B-12 (CYANOCOBALAMIN) 1000 MCG tablet Take 1,000 mcg by mouth daily.     Vitamin D3 (VITAMIN D) 25 MCG tablet Take 1,000 Units by mouth daily.     ondansetron (ZOFRAN) 4 MG tablet Take 1 tablet (4 mg total) by mouth every 8 (eight) hours as needed for nausea or vomiting. 15 tablet 0   No facility-administered medications prior to visit.  Review of Systems;  Patient denies headache, fevers, malaise, unintentional weight loss, skin rash, eye pain, sinus congestion and sinus pain, sore throat, dysphagia,  hemoptysis , cough, dyspnea, wheezing, chest pain, palpitations, orthopnea, edema, abdominal pain, nausea, melena, diarrhea, constipation, flank pain, dysuria, hematuria, urinary  Frequency, nocturia, numbness, tingling, seizures,  Focal weakness, Loss of consciousness,  Tremor, insomnia, depression, anxiety, and suicidal ideation.      Objective:  BP 122/76   Pulse 82   Ht 5' (1.524 m)   Wt 158 lb 12.8 oz (72 kg)   LMP 02/13/2013   SpO2 98%   BMI 31.01 kg/m   BP Readings from Last 3 Encounters:  06/20/23 122/76  06/16/23 118/68  06/14/23 126/84    Wt Readings from Last 3 Encounters:  06/20/23 158 lb 12.8 oz (72 kg)  06/16/23 160 lb (72.6 kg)  06/14/23 158 lb 6.4 oz (71.8 kg)    Physical Exam  Lab  Results  Component Value Date   HGBA1C 6.3 03/09/2023   HGBA1C 6.6 (H) 06/01/2022   HGBA1C 6.4 12/02/2020    Lab Results  Component Value Date   CREATININE 0.92 03/09/2023   CREATININE 0.92 06/01/2022   CREATININE 0.94 12/02/2020    Lab Results  Component Value Date   WBC 4.6 06/01/2022   HGB 12.9 06/01/2022   HCT 39.0 06/01/2022   PLT 251.0 06/01/2022   GLUCOSE 97 03/09/2023   CHOL 171 03/09/2023   TRIG 214.0 (H) 03/09/2023   HDL 48.70 03/09/2023   LDLDIRECT 83.0 03/09/2023   LDLCALC 97 06/01/2022   ALT 20 03/09/2023   AST 16 03/09/2023   NA 141 03/09/2023   K 4.0 03/09/2023   CL 106 03/09/2023   CREATININE 0.92 03/09/2023   BUN 21 03/09/2023   CO2 28 03/09/2023   TSH 3.13 06/01/2022   INR 1.0 11/02/2020   HGBA1C 6.3 03/09/2023   MICROALBUR <0.7 07/26/2022    US Venous Img Lower Unilateral Left (DVT)  Result Date: 06/14/2023 CLINICAL DATA:  Left lower extremity edema EXAM: LEFT LOWER EXTREMITY VENOUS DOPPLER ULTRASOUND TECHNIQUE: Gray-scale sonography with compression, as well as color and duplex ultrasound, were performed to evaluate the deep venous system(s) from the level of the common femoral vein through the popliteal and proximal calf veins. COMPARISON:  None Available. FINDINGS: VENOUS Normal compressibility of the common femoral, superficial femoral, and popliteal veins, as well as the visualized calf veins. Visualized portions of profunda femoral vein and great saphenous vein unremarkable. No filling defects to suggest DVT on grayscale or color Doppler imaging. Doppler waveforms show normal direction of venous flow, normal respiratory plasticity and response to augmentation. Limited views of the contralateral common femoral vein are unremarkable. OTHER Additional focal evaluation of the medial aspect of the left ankle demonstrates a shallow elongated anechoic fluid collection measuring 3.9 x 0.5 x 2.0 cm. Findings are consistent with a focal blister or cyst.  Limitations: none IMPRESSION: 1. No evidence of deep venous thrombosis. 2. Superficial simple fluid collection at the medial aspect of the left ankle consistent with a blister or cyst. Electronically Signed   By: Malachy Moan M.D.   On: 06/14/2023 14:42    Assessment & Plan:  .Lymphedema Assessment & Plan: Involving the left lower extremity,   diagnosis has not been formally made but she has lymphedema pumps from an unknown supplier/provider that she has not used in 4 years, andas a result has recurrent cellulitis of the LE>  advised to resume pumping.  Referral to  [facility] in July has not materialized;  patient has returned their call twice and not heard back.    Cellulitis and abscess of leg  Cellulitis of left lower extremity Assessment & Plan: Dorsum of foot remains red and warm despite use of keflex since oct 23.  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  Changing abx to doxycycline .  Advised to resume pumping of LE twice daily using lymphedema pumps    Other orders -     Doxycycline Hyclate; Take 1 tablet (100 mg total) by mouth 2 (two) times daily.  Dispense: 14 tablet; Refill: 0 -     Ondansetron HCl; Take 1 tablet (4 mg total) by mouth every 8 (eight) hours as needed for nausea or vomiting.  Dispense: 15 tablet; Refill: 0     I provided 30 minutes of face-to-face time during this encounter reviewing patient's  history of recurret cellulitis and chronic LLE edema c/w lymphedema  last v previous  labs and imaging studies, counseling on currently addressed issues,  and post visit ordering to diagnostics and therapeutics .   Follow-up: No follow-ups on file.   Sherlene Shams, MD

## 2023-06-23 ENCOUNTER — Encounter: Payer: Self-pay | Admitting: Internal Medicine

## 2023-06-23 NOTE — Telephone Encounter (Signed)
See me about this. Scheduled with [place] on monday

## 2023-06-23 NOTE — Telephone Encounter (Signed)
Reviewed chart. Reviewed pictures.  Left leg swelling increased?  She had ultrasound that revealed no DVT.  Abx changed to doxycycline - confirm taking.  Needs an appt with [facility] asap.   Per pt, no change in swelling.  If any acute symptoms, she does need to be reevaluated before appt on Monday.

## 2023-06-26 ENCOUNTER — Encounter: Payer: Self-pay | Admitting: Internal Medicine

## 2023-06-26 ENCOUNTER — Ambulatory Visit (INDEPENDENT_AMBULATORY_CARE_PROVIDER_SITE_OTHER): Payer: BC Managed Care – PPO | Admitting: Internal Medicine

## 2023-06-26 ENCOUNTER — Inpatient Hospital Stay
Admission: RE | Admit: 2023-06-26 | Discharge: 2023-07-01 | DRG: 603 | Disposition: A | Discharge status: Home / Self Care | Payer: BC Managed Care – PPO | Source: Ambulatory Visit | Attending: Internal Medicine | Admitting: Internal Medicine

## 2023-06-26 ENCOUNTER — Encounter (HOSPITAL_COMMUNITY): Payer: Self-pay

## 2023-06-26 ENCOUNTER — Other Ambulatory Visit: Payer: Self-pay

## 2023-06-26 VITALS — BP 120/70 | HR 80 | Temp 98.2°F | Resp 16 | Ht 60.0 in | Wt 157.0 lb

## 2023-06-26 DIAGNOSIS — E669 Obesity, unspecified: Secondary | ICD-10-CM | POA: Diagnosis present

## 2023-06-26 DIAGNOSIS — E119 Type 2 diabetes mellitus without complications: Secondary | ICD-10-CM | POA: Diagnosis present

## 2023-06-26 DIAGNOSIS — Z833 Family history of diabetes mellitus: Secondary | ICD-10-CM

## 2023-06-26 DIAGNOSIS — L03116 Cellulitis of left lower limb: Secondary | ICD-10-CM

## 2023-06-26 DIAGNOSIS — Z7982 Long term (current) use of aspirin: Secondary | ICD-10-CM

## 2023-06-26 DIAGNOSIS — R112 Nausea with vomiting, unspecified: Secondary | ICD-10-CM | POA: Diagnosis not present

## 2023-06-26 DIAGNOSIS — E1165 Type 2 diabetes mellitus with hyperglycemia: Secondary | ICD-10-CM

## 2023-06-26 DIAGNOSIS — I10 Essential (primary) hypertension: Secondary | ICD-10-CM | POA: Diagnosis not present

## 2023-06-26 DIAGNOSIS — Z79899 Other long term (current) drug therapy: Secondary | ICD-10-CM

## 2023-06-26 DIAGNOSIS — Z6830 Body mass index (BMI) 30.0-30.9, adult: Secondary | ICD-10-CM

## 2023-06-26 DIAGNOSIS — E7800 Pure hypercholesterolemia, unspecified: Secondary | ICD-10-CM | POA: Diagnosis present

## 2023-06-26 DIAGNOSIS — L0390 Cellulitis, unspecified: Principal | ICD-10-CM | POA: Diagnosis present

## 2023-06-26 DIAGNOSIS — K219 Gastro-esophageal reflux disease without esophagitis: Secondary | ICD-10-CM | POA: Diagnosis present

## 2023-06-26 DIAGNOSIS — Z885 Allergy status to narcotic agent status: Secondary | ICD-10-CM

## 2023-06-26 DIAGNOSIS — I890 Lymphedema, not elsewhere classified: Secondary | ICD-10-CM | POA: Diagnosis present

## 2023-06-26 DIAGNOSIS — Z8249 Family history of ischemic heart disease and other diseases of the circulatory system: Secondary | ICD-10-CM

## 2023-06-26 LAB — BASIC METABOLIC PANEL
Anion gap: 8 (ref 5–15)
BUN: 16 mg/dL (ref 8–23)
CO2: 26 mmol/L (ref 22–32)
Calcium: 10.1 mg/dL (ref 8.9–10.3)
Chloride: 107 mmol/L (ref 98–111)
Creatinine, Ser: 0.86 mg/dL (ref 0.44–1.00)
GFR, Estimated: >60 mL/min (ref >60)
Glucose, Bld: 101 mg/dL — ABNORMAL HIGH (ref 70–99)
Potassium: 3.9 mmol/L (ref 3.5–5.1)
Sodium: 141 mmol/L (ref 135–145)

## 2023-06-26 LAB — MRSA NEXT GEN BY PCR, NASAL: MRSA by PCR Next Gen: NOT DETECTED

## 2023-06-26 LAB — CBC
HCT: 38.2 % (ref 36.0–46.0)
Hemoglobin: 12.2 g/dL (ref 12.0–15.0)
MCH: 29.3 pg (ref 26.0–34.0)
MCHC: 31.9 g/dL (ref 30.0–36.0)
MCV: 91.8 fL (ref 80.0–100.0)
Platelets: 370 10*3/uL (ref 150–400)
RBC: 4.16 MIL/uL (ref 3.87–5.11)
RDW: 13 % (ref 11.5–15.5)
WBC: 5.5 10*3/uL (ref 4.0–10.5)
nRBC: 0 % (ref 0.0–0.2)

## 2023-06-26 LAB — HIV ANTIBODY (ROUTINE TESTING W REFLEX): HIV Screen 4th Generation wRfx: NONREACTIVE

## 2023-06-26 LAB — GROUP A STREP BY PCR: Group A Strep by PCR: NOT DETECTED

## 2023-06-26 MED ORDER — ASPIRIN 81 MG PO TBEC
81.0000 mg | DELAYED_RELEASE_TABLET | Freq: Every day | ORAL | Status: DC
  Administered 2023-06-26 – 2023-07-01 (×6): 81 mg via ORAL
  Filled 2023-06-26 (×6): qty 1

## 2023-06-26 MED ORDER — FESOTERODINE FUMARATE ER 4 MG PO TB24
4.0000 mg | ORAL_TABLET | Freq: Every day | ORAL | Status: DC
  Administered 2023-06-27 – 2023-07-01 (×5): 4 mg via ORAL
  Filled 2023-06-26 (×6): qty 1

## 2023-06-26 MED ORDER — ACETAMINOPHEN 650 MG RE SUPP: 650.0000 mg | Freq: Four times a day (QID) | RECTAL | Status: DC | PRN

## 2023-06-26 MED ORDER — LOSARTAN POTASSIUM 50 MG PO TABS
100.0000 mg | ORAL_TABLET | Freq: Every day | ORAL | Status: DC
  Administered 2023-06-27 – 2023-07-01 (×5): 100 mg via ORAL
  Filled 2023-06-26 (×6): qty 2

## 2023-06-26 MED ORDER — ROSUVASTATIN CALCIUM 20 MG PO TABS
40.0000 mg | ORAL_TABLET | Freq: Every day | ORAL | Status: DC
  Administered 2023-06-26 – 2023-07-01 (×6): 40 mg via ORAL
  Filled 2023-06-26 (×6): qty 2

## 2023-06-26 MED ORDER — ONDANSETRON HCL 4 MG PO TABS
4.0000 mg | ORAL_TABLET | Freq: Three times a day (TID) | ORAL | Status: DC | PRN
  Administered 2023-06-29 – 2023-06-30 (×2): 4 mg via ORAL
  Filled 2023-06-26 (×3): qty 1

## 2023-06-26 MED ORDER — PANTOPRAZOLE SODIUM 40 MG PO TBEC
40.0000 mg | DELAYED_RELEASE_TABLET | Freq: Every day | ORAL | Status: DC
  Administered 2023-06-26 – 2023-07-01 (×6): 40 mg via ORAL
  Filled 2023-06-26 (×6): qty 1

## 2023-06-26 MED ORDER — CARVEDILOL 6.25 MG PO TABS
12.5000 mg | ORAL_TABLET | Freq: Two times a day (BID) | ORAL | Status: DC
  Administered 2023-06-26 – 2023-07-01 (×10): 12.5 mg via ORAL
  Filled 2023-06-26 (×10): qty 2

## 2023-06-26 MED ORDER — ENOXAPARIN SODIUM 40 MG/0.4ML IJ SOSY
40.0000 mg | PREFILLED_SYRINGE | INTRAMUSCULAR | Status: DC
  Administered 2023-06-26 – 2023-06-30 (×5): 40 mg via SUBCUTANEOUS
  Filled 2023-06-26 (×5): qty 0.4

## 2023-06-26 MED ORDER — SODIUM CHLORIDE 0.9 % IV SOLN
2.0000 g | Freq: Three times a day (TID) | INTRAVENOUS | Status: DC
  Administered 2023-06-26 – 2023-06-30 (×12): 2 g via INTRAVENOUS
  Filled 2023-06-26 (×13): qty 12.5

## 2023-06-26 MED ORDER — ACETAMINOPHEN 325 MG PO TABS
650.0000 mg | ORAL_TABLET | Freq: Four times a day (QID) | ORAL | Status: DC | PRN
  Administered 2023-06-26 – 2023-06-29 (×8): 650 mg via ORAL
  Filled 2023-06-26 (×8): qty 2

## 2023-06-26 MED ORDER — AMLODIPINE BESYLATE 5 MG PO TABS
5.0000 mg | ORAL_TABLET | Freq: Two times a day (BID) | ORAL | Status: DC
  Administered 2023-06-26 – 2023-07-01 (×10): 5 mg via ORAL
  Filled 2023-06-26 (×11): qty 1

## 2023-06-26 MED ORDER — SODIUM CHLORIDE 0.9 % IV SOLN
2.0000 g | Freq: Once | INTRAVENOUS | Status: DC
  Filled 2023-06-26: qty 12.5

## 2023-06-26 MED ORDER — HYDROCODONE-ACETAMINOPHEN 5-325 MG PO TABS
1.0000 | ORAL_TABLET | ORAL | Status: DC | PRN
Start: 2023-06-26 — End: 2023-07-01
  Administered 2023-06-30: 1 via ORAL
  Filled 2023-06-26: qty 1

## 2023-06-26 NOTE — Progress Notes (Signed)
Pharmacy Antibiotic Note  Deborah Bartlett is a 62 y.o. female admitted on 06/26/2023 with cellulitis. Patient developed a rash on left lower extremity approximately 2 weeks prior to admission which developed into pain blisters and has continued to worsen in the last 7 days despite outpatient antibiotic treatment. PMH significant for T2DM, HTN, HLD. Patient remains afebrile with no leukocytosis. Pharmacy has been consulted for cefepime dosing.  Plan: Start cefepime 2 g IV every 8 hours based on current renal function Monitor renal function, clinical status, and LOT    Temp (24hrs), Avg:97.9 F (36.6 C), Min:97.6 F (36.4 C), Max:98.2 F (36.8 C)  Recent Labs  Lab 06/26/23 1157  WBC 5.5  CREATININE 0.86    Estimated Creatinine Clearance: 60.5 mL/min (by C-G formula based on SCr of 0.86 mg/dL).    Allergies  Allergen Reactions   Codeine Palpitations    dizzy    Antimicrobials this admission: Cefepime 11/4 >>   Dose adjustments this admission: N/A  Microbiology results: 11/04 MRSA PCR: Neg  Thank you for involving pharmacy in this patient's care.   Rockwell Alexandria, PharmD Clinical Pharmacist 06/26/2023 2:01 PM

## 2023-06-26 NOTE — Progress Notes (Signed)
Subjective:    Patient ID: Deborah Bartlett, female    DOB: 1960/10/22, 62 y.o.   MRN: [id number]  Patient here for  Chief Complaint  Patient presents with   Cellulitis    HPI Work in appt. Work in for continued leg swelling and cellulitis. Initially seen 06/14/23 - Dr Birdie Sons - left leg swelling and erythema. Also reported a blister on her left heel. Was started on keflex. Lower extremity ultrasound - negative for DVT. Reevaluated 06/16/23 by Bethanie Dicker. Pain and redness improved.  Still with increased swelling. Blister - larger.  Reevaluated by Dr Darrick Huntsman 06/20/23. Abx changed to doxycycline. Recommended to resume lymphedema pump. Presents today for follow up. Reports persistent increased swelling.  No improvement.  Persistent increased erythema.  Increased pain.  Unable to walk without increased pain.  She is eating, but reports decreased appetite.  No vomiting.  She has been taking the doxycycline regularly with no improvement.  Persistent blister lesion - heel.     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   CESAREAN SECTION  1990, 1993, 1999   COLONOSCOPY  2014   Dr Lemar Livings   ORIF ELBOW FRACTURE Left 05/26/2020   Procedure: OPEN REDUCTION INTERNAL FIXATION (ORIF) ELBOW/OLECRANON FRACTURE;  Surgeon: Christena Flake, MD;  Location: [facility];  Service: Orthopedics;  Laterality: Left;   SHOULDER ARTHROSCOPY WITH ROTATOR CUFF REPAIR Right 02/21/2018   Procedure: SHOULDER ARTHROSCOPY WITH DEBRIDEMENT DECOMPRESSION AND REPAIR OF ROTATOR CUFF TEAR;  Surgeon: Christena Flake, MD;  Location: [facility];  Service: Orthopedics;  Laterality: Right;   Diabetic - diet controlled   Family History  Problem Relation Age of Onset   Hypertension Mother    Diabetes Mother    Diabetes Maternal Grandmother    Diabetes Maternal Grandfather    Breast cancer Neg Hx    Colon cancer Neg Hx    Social History   Socioeconomic History   Marital status: Married    Spouse name: Not on file   Number of children: Not on file   Years of education: Not on file   Highest education level: 12th grade  Occupational History   Not on file  Tobacco Use   Smoking status: Never   Smokeless tobacco: Never  Vaping Use   Vaping status: Never Used  Substance and Sexual Activity   Alcohol use: No    Alcohol/week: 0.0 standard drinks of alcohol   Drug use: No   Sexual activity: Not on file  Other Topics Concern   Not on file  Social History Narrative   Not on file   Social Determinants of Health   Financial Resource Strain: Low Risk  (03/01/2023)   Overall Financial Resource Strain (CARDIA)    Difficulty of Paying Living Expenses: Not hard at all  Food Insecurity: Unknown (03/01/2023)   Hunger Vital Sign    Worried About Running Out of Food in the Last Year: Never true    Ran Out of Food in the Last Year: Patient declined  Transportation Needs: No Transportation Needs (03/01/2023)   PRAPARE - Administrator, Civil Service (Medical): No  Lack of Transportation (Non-Medical): No  Physical Activity: Insufficiently Active (03/01/2023)   Exercise Vital Sign    Days of Exercise per Week: 2 days    Minutes of Exercise per Session: 20 min  Stress: No Stress Concern Present (03/01/2023)   [employer] of Occupational Health - Occupational Stress Questionnaire    Feeling of Stress : Only a little  Social Connections: Socially Integrated (03/01/2023)   Social Connection and Isolation Panel [NHANES]    Frequency of Communication with Friends and Family: More than three times a week    Frequency of Social Gatherings with Friends and Family:  Three times a week    Attends Religious Services: More than 4 times per year    Active Member of Clubs or Organizations: Yes    Attends Banker Meetings: 1 to 4 times per year    Marital Status: Married     Review of Systems  Constitutional:  Negative for fever.       Decreased appetite.   HENT:  Negative for congestion and sinus pressure.   Respiratory:  Negative for cough, chest tightness and shortness of breath.   Cardiovascular:  Positive for leg swelling. Negative for chest pain and palpitations.  Gastrointestinal:  Negative for abdominal pain and vomiting.  Genitourinary:  Negative for difficulty urinating and dysuria.  Musculoskeletal:  Negative for back pain and myalgias.  Skin:        Increased erythema - extending up to just below the knee. Persistent blister lesion - heel.   Neurological:  Negative for dizziness and headaches.  Psychiatric/Behavioral:  Negative for agitation and dysphoric mood.        Objective:     BP 120/70   Pulse 80   Temp 98.2 F (36.8 C)   Resp 16   Ht 5' (1.524 m)   Wt 157 lb (71.2 kg)   LMP 02/13/2013   SpO2 99%   BMI 30.66 kg/m  Wt Readings from Last 3 Encounters:  06/26/23 157 lb (71.2 kg)  06/20/23 158 lb 12.8 oz (72 kg)  06/16/23 160 lb (72.6 kg)    Physical Exam Vitals reviewed.  Constitutional:      General: She is not in acute distress.    Appearance: Normal appearance.  HENT:     Head: Normocephalic and atraumatic.     Right Ear: External ear normal.     Left Ear: External ear normal.  Eyes:     General: No scleral icterus.       Right eye: No discharge.        Left eye: No discharge.     Conjunctiva/sclera: Conjunctivae normal.  Neck:     Thyroid: No thyromegaly.  Cardiovascular:     Rate and Rhythm: Normal rate and regular rhythm.  Pulmonary:     Effort: No respiratory distress.     Breath sounds: Normal breath sounds. No wheezing.  Abdominal:     General: Bowel sounds are normal.      Palpations: Abdomen is soft.     Tenderness: There is no abdominal tenderness.  Musculoskeletal:     Cervical back: Neck supple. No tenderness.     Comments: Persistent increased swelling of left leg  - lower left leg - extending up to her knee.  Increased erythema - up to knee.  Increased pain to palpation (increased pain with minimal touch).  Increased pain with weight bearing.   Lymphadenopathy:     Cervical: No cervical adenopathy.  Skin:  Findings: No erythema or rash.  Neurological:     Mental Status: She is alert.  Psychiatric:        Mood and Affect: Mood normal.        Behavior: Behavior normal.      Outpatient Encounter Medications as of 06/26/2023  Medication Sig   amLODipine (NORVASC) 5 MG tablet TAKE 1 TABLET BY MOUTH TWICE A DAY   aspirin 81 MG tablet Take 81 mg by mouth daily.   carvedilol (COREG) 12.5 MG tablet TAKE 1 TABLET (12.5MG  TOTAL) BY MOUTH TWICE A DAY WITH MEALS   ferrous sulfate 325 (65 FE) MG tablet Take 325 mg by mouth daily with breakfast.   Lancets (ONETOUCH ULTRASOFT) lancets Use as instructed to check Blood sugars 3-4 times a week, twice a day.  Dispense One Touch brand. E11.9   losartan (COZAAR) 100 MG tablet TAKE 1 TABLET BY MOUTH EVERY DAY   Magnesium 250 MG TABS Take 250 mg by mouth at bedtime.   ondansetron (ZOFRAN) 4 MG tablet Take 1 tablet (4 mg total) by mouth every 8 (eight) hours as needed for nausea or vomiting.   ONETOUCH VERIO test strip CHECK BLOOD SUGAR TWICE A DAY 3 TO 4 TIMES A WEEK   pantoprazole (PROTONIX) 40 MG tablet TAKE 1 TABLET (40 MG TOTAL) BY MOUTH DAILY TAKE 30 MINUTES BEFORE BREAKFAST   rosuvastatin (CRESTOR) 40 MG tablet TAKE 1 TABLET BY MOUTH EVERY DAY   solifenacin (VESICARE) 5 MG tablet TAKE 1 TABLET (5 MG TOTAL) BY MOUTH DAILY.   vitamin B-12 (CYANOCOBALAMIN) 1000 MCG tablet Take 1,000 mcg by mouth daily.   Vitamin D3 (VITAMIN D) 25 MCG tablet Take 1,000 Units by mouth daily.   [DISCONTINUED] cephALEXin (KEFLEX) 500  MG capsule Take 1 capsule (500 mg total) by mouth 4 (four) times daily.   [DISCONTINUED] doxycycline (VIBRA-TABS) 100 MG tablet Take 1 tablet (100 mg total) by mouth 2 (two) times daily.   No facility-administered encounter medications on file as of 06/26/2023.     Lab Results  Component Value Date   WBC 4.6 06/01/2022   HGB 12.9 06/01/2022   HCT 39.0 06/01/2022   PLT 251.0 06/01/2022   GLUCOSE 97 03/09/2023   CHOL 171 03/09/2023   TRIG 214.0 (H) 03/09/2023   HDL 48.70 03/09/2023   LDLDIRECT 83.0 03/09/2023   LDLCALC 97 06/01/2022   ALT 20 03/09/2023   AST 16 03/09/2023   NA 141 03/09/2023   K 4.0 03/09/2023   CL 106 03/09/2023   CREATININE 0.92 03/09/2023   BUN 21 03/09/2023   CO2 28 03/09/2023   TSH 3.13 06/01/2022   INR 1.0 11/02/2020   HGBA1C 6.3 03/09/2023   MICROALBUR <0.7 07/26/2022    US Venous Img Lower Unilateral Left (DVT)  Result Date: 06/14/2023 CLINICAL DATA:  Left lower extremity edema EXAM: LEFT LOWER EXTREMITY VENOUS DOPPLER ULTRASOUND TECHNIQUE: Gray-scale sonography with compression, as well as color and duplex ultrasound, were performed to evaluate the deep venous system(s) from the level of the common femoral vein through the popliteal and proximal calf veins. COMPARISON:  None Available. FINDINGS: VENOUS Normal compressibility of the common femoral, superficial femoral, and popliteal veins, as well as the visualized calf veins. Visualized portions of profunda femoral vein and great saphenous vein unremarkable. No filling defects to suggest DVT on grayscale or color Doppler imaging. Doppler waveforms show normal direction of venous flow, normal respiratory plasticity and response to augmentation. Limited views of the contralateral common femoral vein are  unremarkable. OTHER Additional focal evaluation of the medial aspect of the left ankle demonstrates a shallow elongated anechoic fluid collection measuring 3.9 x 0.5 x 2.0 cm. Findings are consistent with a  focal blister or cyst. Limitations: none IMPRESSION: 1. No evidence of deep venous thrombosis. 2. Superficial simple fluid collection at the medial aspect of the left ankle consistent with a blister or cyst. Electronically Signed   By: Malachy Moan M.D.   On: 06/14/2023 14:42       Assessment & Plan:  Cellulitis of left lower extremity Assessment & Plan: Persistent increased swelling, pain and erythema despite almost two weeks of abx.  Currently on doxycycline and no improvement.  Increased pain.  Increased pain with weight bearing and light touch.  Lower extremity swelling - persistent despite leg elevation and pumps.  Concern regarding persistent increased symptoms despite abx - failing outpatient treatment.  Discussed admission for IV abx and further w/up if needed to r/o osteomyelitis.  Appears to be more soft tissue, but increasing pain.  Recent lower extremity ultrasound negative for DVT.  Hospitalist contacted.    Type 2 diabetes mellitus with hyperglycemia, without long-term current use of insulin (HCC) Assessment & Plan: Diabetic.  Persistent increased swelling and redness, failing outpatient therapy.  Feel IV abx warranted.    Essential hypertension, benign Assessment & Plan: Continues on Coreg, losartan and amlodipine. On coreg 12.5mg  bid. Blood pressures as outlined.       Dale [place], MD

## 2023-06-26 NOTE — Plan of Care (Signed)
  Problem: Education: Goal: Knowledge of General Education information will improve Description: Including pain rating scale, medication(s)/side effects and non-pharmacologic comfort measures Outcome: Progressing   Problem: Education: Goal: Knowledge of General Education information will improve Description: Including pain rating scale, medication(s)/side effects and non-pharmacologic comfort measures Outcome: Progressing   Problem: Activity: Goal: Risk for activity intolerance will decrease Outcome: Progressing   Problem: Nutrition: Goal: Adequate nutrition will be maintained Outcome: Progressing

## 2023-06-26 NOTE — Plan of Care (Signed)
  Problem: Clinical Measurements: Goal: Ability to avoid or minimize complications of infection will improve Outcome: Progressing   Problem: Skin Integrity: Goal: Skin integrity will improve Outcome: Progressing   Problem: Education: Goal: Knowledge of General Education information will improve Description: Including pain rating scale, medication(s)/side effects and non-pharmacologic comfort measures 06/26/2023 1829 by Irish Lack, RN Outcome: Progressing 06/26/2023 1623 by Irish Lack, RN Outcome: Progressing   Problem: Education: Goal: Knowledge of General Education information will improve Description: Including pain rating scale, medication(s)/side effects and non-pharmacologic comfort measures Outcome: Progressing   Problem: Activity: Goal: Risk for activity intolerance will decrease 06/26/2023 1829 by Irish Lack, RN Outcome: Progressing 06/26/2023 1623 by Irish Lack, RN Outcome: Progressing   Problem: Nutrition: Goal: Adequate nutrition will be maintained 06/26/2023 1829 by Irish Lack, RN Outcome: Progressing 06/26/2023 1623 by Irish Lack, RN Outcome: Progressing

## 2023-06-26 NOTE — H&P (Signed)
History and Physical    Deborah Bartlett:[id number] DOB: [birth year] DOA: 06/26/2023  PCP: Dale [place], MD (Confirm with patient/family/NH records and if not entered, this has to be entered at [facility] point of entry) Patient coming from: Home  I have personally briefly reviewed patient's old medical records in [facility]  Chief Complaint: Cellulitis on left leg  HPI: Deborah Bartlett is a 62 y.o. female with medical history significant of IIDM on diet control, HTN, HLD, sent from PCP office to evaluate of worsening of left leg cellulitis.  Symptoms started 2 weeks ago patient developed left lower extremity rash and a painful blister filled with clear liquid.  Along with significant swelling "left leg 2 times as big at the right side ". went to see PCP, when she was diagnosed with cellulitis left leg cellulitis.  DVT study in the office was negative.  Patient was started on Keflex for 1 week followed by doxycycline for another week which she completed yesterday.  Initially patient saw some improvement however no significant improvement since last week.  Denies any fever or chills.  She went to see PCP today who sent patient to hospital for further evaluation with direct admission.   Review of Systems: As per HPI otherwise 14 point review of systems negative.    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   CESAREAN SECTION  1990, 1993, 1999   COLONOSCOPY  2014   Dr Lemar Livings   ORIF ELBOW FRACTURE Left 05/26/2020   Procedure: OPEN REDUCTION INTERNAL FIXATION (ORIF) ELBOW/OLECRANON FRACTURE;  Surgeon: Christena Flake, MD;  Location: [facility] ORS;  Service:  Orthopedics;  Laterality: Left;   SHOULDER ARTHROSCOPY WITH ROTATOR CUFF REPAIR Right 02/21/2018   Procedure: SHOULDER ARTHROSCOPY WITH DEBRIDEMENT DECOMPRESSION AND REPAIR OF ROTATOR CUFF TEAR;  Surgeon: Christena Flake, MD;  Location: [facility];  Service: Orthopedics;  Laterality: Right;  Diabetic - diet controlled     reports that she has never smoked. She has never used smokeless tobacco. She reports that she does not drink alcohol and does not use drugs.  Allergies  Allergen Reactions   Codeine Palpitations    dizzy    Family History  Problem Relation Age of Onset   Hypertension Mother    Diabetes Mother    Diabetes Maternal Grandmother    Diabetes Maternal Grandfather    Breast cancer Neg Hx    Colon cancer Neg Hx      Prior to Admission medications   Medication Sig Start Date End Date Taking? Authorizing Provider  amLODipine (NORVASC) 5 MG tablet TAKE 1 TABLET BY MOUTH TWICE A DAY 08/31/22   Dale [place], MD  aspirin 81 MG tablet Take 81 mg by mouth daily.    [provider]  carvedilol (COREG) 12.5 MG tablet TAKE 1 TABLET (12.5MG  TOTAL) BY MOUTH TWICE A DAY WITH MEALS 05/25/23   Dale [place], MD  ferrous sulfate 325 (65 FE) MG tablet Take 325 mg by mouth daily with breakfast.    [provider]  Lancets (ONETOUCH ULTRASOFT) lancets Use as instructed to check Blood sugars 3-4 times a week,  twice a day.  Dispense One Touch brand. E11.9 02/17/16   Dale [place], MD  losartan (COZAAR) 100 MG tablet TAKE 1 TABLET BY MOUTH EVERY DAY 05/31/23   Dale [place], MD  Magnesium 250 MG TABS Take 250 mg by mouth at bedtime.    [provider]  ondansetron (ZOFRAN) 4 MG tablet Take 1 tablet (4 mg total) by mouth every 8 (eight) hours as needed for nausea or vomiting. 06/20/23   Sherlene Shams, MD  ONETOUCH VERIO test strip CHECK BLOOD SUGAR TWICE A DAY 3 TO 4 TIMES A WEEK 02/10/22   Worthy Rancher B, FNP  pantoprazole (PROTONIX) 40 MG tablet TAKE 1  TABLET (40 MG TOTAL) BY MOUTH DAILY TAKE 30 MINUTES BEFORE BREAKFAST 02/24/23   Dale Motley, MD  rosuvastatin (CRESTOR) 40 MG tablet TAKE 1 TABLET BY MOUTH EVERY DAY 08/31/22   Dale Penermon, MD  solifenacin (VESICARE) 5 MG tablet TAKE 1 TABLET (5 MG TOTAL) BY MOUTH DAILY. 06/07/23   Glori Luis, MD  vitamin B-12 (CYANOCOBALAMIN) 1000 MCG tablet Take 1,000 mcg by mouth daily.    [provider]  Vitamin D3 (VITAMIN D) 25 MCG tablet Take 1,000 Units by mouth daily.    [provider]    Physical Exam: Vitals:   06/26/23 1050  BP: (!) 147/87  Pulse: 77  Temp: 97.6 F (36.4 C)  TempSrc: Oral  SpO2: 98%    Constitutional: NAD, calm, comfortable Vitals:   06/26/23 1050  BP: (!) 147/87  Pulse: 77  Temp: 97.6 F (36.4 C)  TempSrc: Oral  SpO2: 98%   Eyes: PERRL, lids and conjunctivae normal ENMT: Mucous membranes are moist. Posterior pharynx clear of any exudate or lesions.Normal dentition.  Neck: normal, supple, no masses, no thyromegaly Respiratory: clear to auscultation bilaterally, no wheezing, no crackles. Normal respiratory effort. No accessory muscle use.  Cardiovascular: Regular rate and rhythm, no murmurs / rubs / gallops. No extremity edema. 2+ pedal pulses. No carotid bruits.  Abdomen: no tenderness, no masses palpated. No hepatosplenomegaly. Bowel sounds positive.  Musculoskeletal: no clubbing / cyanosis. No joint deformity upper and lower extremities. Good ROM, no contractures. Normal muscle tone.  Skin: Left leg and left foot swelling and rash significant tenderness and increased local temperature compared to right side.  2+ edema left leg and foot Neurologic: CN 2-12 grossly intact. Sensation intact, DTR normal. Strength 5/5 in all 4.  Psychiatric: Normal judgment and insight. Alert and oriented x 3. Normal mood.     Labs on Admission: I have personally reviewed following labs and imaging studies  CBC: Recent Labs  Lab 06/26/23 1157   WBC 5.5  HGB 12.2  HCT 38.2  MCV 91.8  PLT 370   Basic Metabolic Panel: No results for input(s): "NA", "K", "CL", "CO2", "GLUCOSE", "BUN", "CREATININE", "CALCIUM", "MG", "PHOS" in the last 168 hours. GFR: CrCl cannot be calculated (Patient's most recent lab result is older than the maximum 21 days allowed.). Liver Function Tests: No results for input(s): "AST", "ALT", "ALKPHOS", "BILITOT", "PROT", "ALBUMIN" in the last 168 hours. No results for input(s): "LIPASE", "AMYLASE" in the last 168 hours. No results for input(s): "AMMONIA" in the last 168 hours. Coagulation Profile: No results for input(s): "INR", "PROTIME" in the last 168 hours. Cardiac Enzymes: No results for input(s): "CKTOTAL", "CKMB", "CKMBINDEX", "TROPONINI" in the last 168 hours. BNP (last 3 results) No results for input(s): "PROBNP" in the last 8760 hours. HbA1C: No results for input(s): "HGBA1C" in the last 72  hours. CBG: No results for input(s): "GLUCAP" in the last 168 hours. Lipid Profile: No results for input(s): "CHOL", "HDL", "LDLCALC", "TRIG", "CHOLHDL", "LDLDIRECT" in the last 72 hours. Thyroid Function Tests: No results for input(s): "TSH", "T4TOTAL", "FREET4", "T3FREE", "THYROIDAB" in the last 72 hours. Anemia Panel: No results for input(s): "VITAMINB12", "FOLATE", "FERRITIN", "TIBC", "IRON", "RETICCTPCT" in the last 72 hours. Urine analysis:    Component Value Date/Time   COLORURINE STRAW (A) 11/02/2020 2109   APPEARANCEUR CLEAR (A) 11/02/2020 2109   LABSPEC 1.009 11/02/2020 2109   PHURINE 7.0 11/02/2020 2109   GLUCOSEU NEGATIVE 11/02/2020 2109   GLUCOSEU NEGATIVE 07/13/2016 0849   HGBUR NEGATIVE 11/02/2020 2109   BILIRUBINUR NEGATIVE 11/02/2020 2109   KETONESUR NEGATIVE 11/02/2020 2109   PROTEINUR NEGATIVE 11/02/2020 2109   UROBILINOGEN 0.2 07/13/2016 0849   NITRITE NEGATIVE 11/02/2020 2109   LEUKOCYTESUR NEGATIVE 11/02/2020 2109    Radiological Exams on Admission: No results  found.  EKG: None  Assessment/Plan Principal Problem:   Cellulitis Active Problems:   Left leg cellulitis  (please populate well all problems here in Problem List. (For example, if patient is on BP meds at home and you resume or decide to hold them, it is a problem that needs to be her. Same for CAD, COPD, HLD and so on)  Left leg cellulitis -Failed outpatient management.  2 courses of outpatient antibiotics covering most common skin flora including MSSA and MRSA.  Clinically does not look like features compatible with MRSA cellulitis.  Given that patient also history of diabetes, will cover Pseudomonas cellulitis.  Start cefepime, pharmacy consulted.  Explained to the patient who agreed with the plan. -Send MRSA screening -DVT ruled out by outpatient Doppler study  IIDM -Most recent A1c 6.3, continue diet control  HTN -Continue amlodipine and losartan  HLD -Continue statin  DVT prophylaxis: Lovenox Code Status: Full code Family Communication: Husband at bedside Disposition Plan: Expect less than 2 midnight hospital stay Consults called: None Admission status: MedSurg observation   Emeline General MD [facility] Pager [phone]  06/26/2023, 12:23 PM

## 2023-06-26 NOTE — Assessment & Plan Note (Signed)
Persistent increased swelling, pain and erythema despite almost two weeks of abx.  Currently on doxycycline and no improvement.  Increased pain.  Increased pain with weight bearing and light touch.  Lower extremity swelling - persistent despite leg elevation and pumps.  Concern regarding persistent increased symptoms despite abx - failing outpatient treatment.  Discussed admission for IV abx and further w/up if needed to r/o osteomyelitis.  Appears to be more soft tissue, but increasing pain.  Recent lower extremity ultrasound negative for DVT.  Hospitalist contacted.

## 2023-06-26 NOTE — Assessment & Plan Note (Signed)
Continues on Coreg, losartan and amlodipine. On coreg 12.5mg  bid. Blood pressures as outlined.

## 2023-06-26 NOTE — Assessment & Plan Note (Signed)
Diabetic.  Persistent increased swelling and redness, failing outpatient therapy.  Feel IV abx warranted.

## 2023-06-26 NOTE — Plan of Care (Signed)
  Problem: Clinical Measurements: Goal: Ability to avoid or minimize complications of infection will improve 06/26/2023 2313 by Bailey Kolbe, Manfred Arch, RN Outcome: Progressing 06/26/2023 2313 by Elam Dutch, RN Outcome: Progressing   Problem: Skin Integrity: Goal: Skin integrity will improve 06/26/2023 2313 by Djeneba Barsch, Manfred Arch, RN Outcome: Progressing 06/26/2023 2313 by Elam Dutch, RN Outcome: Progressing   Problem: Education: Goal: Knowledge of General Education information will improve Description: Including pain rating scale, medication(s)/side effects and non-pharmacologic comfort measures 06/26/2023 2313 by Paulette Rockford, Manfred Arch, RN Outcome: Progressing 06/26/2023 2313 by Elam Dutch, RN Outcome: Progressing   Problem: Education: Goal: Knowledge of General Education information will improve Description: Including pain rating scale, medication(s)/side effects and non-pharmacologic comfort measures 06/26/2023 2313 by Elam Dutch, RN Outcome: Progressing 06/26/2023 2313 by Elam Dutch, RN Outcome: Progressing   Problem: Health Behavior/Discharge Planning: Goal: Ability to manage health-related needs will improve 06/26/2023 2313 by Sherrill Buikema, Manfred Arch, RN Outcome: Progressing 06/26/2023 2313 by Elam Dutch, RN Outcome: Progressing   Problem: Clinical Measurements: Goal: Ability to maintain clinical measurements within normal limits will improve 06/26/2023 2313 by Tnya Ades, Manfred Arch, RN Outcome: Progressing 06/26/2023 2313 by Elam Dutch, RN Outcome: Progressing Goal: Will remain free from infection 06/26/2023 2313 by Eulas Schweitzer, Manfred Arch, RN Outcome: Progressing 06/26/2023 2313 by Elam Dutch, RN Outcome: Progressing Goal: Diagnostic test results will improve 06/26/2023 2313 by Feven Alderfer, Manfred Arch, RN Outcome: Progressing 06/26/2023 2313 by Elam Dutch,  RN Outcome: Progressing Goal: Respiratory complications will improve 06/26/2023 2313 by Antasia Haider, Manfred Arch, RN Outcome: Progressing 06/26/2023 2313 by Elam Dutch, RN Outcome: Progressing Goal: Cardiovascular complication will be avoided 06/26/2023 2313 by Kymberlyn Eckford, Manfred Arch, RN Outcome: Progressing 06/26/2023 2313 by Elam Dutch, RN Outcome: Progressing   Problem: Activity: Goal: Risk for activity intolerance will decrease 06/26/2023 2313 by Waverly Chavarria, Manfred Arch, RN Outcome: Progressing 06/26/2023 2313 by Elam Dutch, RN Outcome: Progressing   Problem: Nutrition: Goal: Adequate nutrition will be maintained 06/26/2023 2313 by Mariabella Nilsen, Manfred Arch, RN Outcome: Progressing 06/26/2023 2313 by Elam Dutch, RN Outcome: Progressing   Problem: Coping: Goal: Level of anxiety will decrease 06/26/2023 2313 by Korrey Schleicher, Manfred Arch, RN Outcome: Progressing 06/26/2023 2313 by Elam Dutch, RN Outcome: Progressing   Problem: Elimination: Goal: Will not experience complications related to bowel motility 06/26/2023 2313 by Demetrion Wesby, Manfred Arch, RN Outcome: Progressing 06/26/2023 2313 by Elam Dutch, RN Outcome: Progressing Goal: Will not experience complications related to urinary retention 06/26/2023 2313 by Alexee Delsanto, Manfred Arch, RN Outcome: Progressing 06/26/2023 2313 by Elam Dutch, RN Outcome: Progressing   Problem: Pain Management: Goal: General experience of comfort will improve 06/26/2023 2313 by Todd Jelinski, Manfred Arch, RN Outcome: Progressing 06/26/2023 2313 by Elam Dutch, RN Outcome: Progressing   Problem: Safety: Goal: Ability to remain free from injury will improve 06/26/2023 2313 by Katria Botts, Manfred Arch, RN Outcome: Progressing 06/26/2023 2313 by Elam Dutch, RN Outcome: Progressing   Problem: Skin Integrity: Goal: Risk for impaired skin integrity will  decrease 06/26/2023 2313 by Walaa Carel, Manfred Arch, RN Outcome: Progressing 06/26/2023 2313 by Elam Dutch, RN Outcome: Progressing

## 2023-06-27 ENCOUNTER — Inpatient Hospital Stay: Payer: BC Managed Care – PPO

## 2023-06-27 ENCOUNTER — Ambulatory Visit: Payer: BC Managed Care – PPO | Admitting: Family

## 2023-06-27 DIAGNOSIS — Z8249 Family history of ischemic heart disease and other diseases of the circulatory system: Secondary | ICD-10-CM | POA: Diagnosis not present

## 2023-06-27 DIAGNOSIS — Z6830 Body mass index (BMI) 30.0-30.9, adult: Secondary | ICD-10-CM | POA: Diagnosis not present

## 2023-06-27 DIAGNOSIS — E7800 Pure hypercholesterolemia, unspecified: Secondary | ICD-10-CM

## 2023-06-27 DIAGNOSIS — E1165 Type 2 diabetes mellitus with hyperglycemia: Secondary | ICD-10-CM

## 2023-06-27 DIAGNOSIS — L03116 Cellulitis of left lower limb: Secondary | ICD-10-CM | POA: Diagnosis present

## 2023-06-27 DIAGNOSIS — Z833 Family history of diabetes mellitus: Secondary | ICD-10-CM | POA: Diagnosis not present

## 2023-06-27 DIAGNOSIS — Z7982 Long term (current) use of aspirin: Secondary | ICD-10-CM | POA: Diagnosis not present

## 2023-06-27 DIAGNOSIS — I10 Essential (primary) hypertension: Secondary | ICD-10-CM

## 2023-06-27 DIAGNOSIS — E669 Obesity, unspecified: Secondary | ICD-10-CM | POA: Diagnosis present

## 2023-06-27 DIAGNOSIS — I890 Lymphedema, not elsewhere classified: Secondary | ICD-10-CM | POA: Diagnosis present

## 2023-06-27 DIAGNOSIS — K219 Gastro-esophageal reflux disease without esophagitis: Secondary | ICD-10-CM | POA: Diagnosis present

## 2023-06-27 DIAGNOSIS — R112 Nausea with vomiting, unspecified: Secondary | ICD-10-CM | POA: Diagnosis not present

## 2023-06-27 DIAGNOSIS — E119 Type 2 diabetes mellitus without complications: Secondary | ICD-10-CM | POA: Diagnosis present

## 2023-06-27 DIAGNOSIS — Z885 Allergy status to narcotic agent status: Secondary | ICD-10-CM | POA: Diagnosis not present

## 2023-06-27 DIAGNOSIS — Z79899 Other long term (current) drug therapy: Secondary | ICD-10-CM | POA: Diagnosis not present

## 2023-06-27 LAB — GLUCOSE, CAPILLARY: Glucose-Capillary: 144 mg/dL — ABNORMAL HIGH (ref 70–99)

## 2023-06-27 MED ORDER — MAGNESIUM OXIDE -MG SUPPLEMENT 400 (240 MG) MG PO TABS
200.0000 mg | ORAL_TABLET | Freq: Every day | ORAL | Status: DC
  Administered 2023-06-27 – 2023-06-30 (×4): 200 mg via ORAL
  Filled 2023-06-27 (×3): qty 1
  Filled 2023-06-27: qty 0.5

## 2023-06-27 NOTE — Assessment & Plan Note (Signed)
-

## 2023-06-27 NOTE — TOC CM/SW Note (Signed)
Transition of Care [facility]) - Inpatient Brief Assessment   Patient Details  Name: AIVAH PUTMAN MRN: [id number] Date of Birth: 01-22-1961  Transition of Care [facility] CM/SW Contact:    Allena Katz, LCSW Phone Number: 06/27/2023, 3:52 PM   Clinical Narrative:    Transition of Care Asessment: Insurance and Status: Insurance coverage has been reviewed Patient has primary care physician: Yes Home environment has been reviewed: [id number] Prior level of function:: Independent Prior/Current Home Services: No current home services Social Determinants of Health Reivew: SDOH reviewed no interventions necessary Readmission risk has been reviewed: Yes Transition of care needs: no transition of care needs at this time

## 2023-06-27 NOTE — Plan of Care (Signed)
  Problem: Clinical Measurements: Goal: Ability to avoid or minimize complications of infection will improve Outcome: Progressing   Problem: Skin Integrity: Goal: Skin integrity will improve Outcome: Progressing   Problem: Education: Goal: Knowledge of General Education information will improve Description: Including pain rating scale, medication(s)/side effects and non-pharmacologic comfort measures Outcome: Progressing   Problem: Education: Goal: Knowledge of General Education information will improve Description: Including pain rating scale, medication(s)/side effects and non-pharmacologic comfort measures Outcome: Progressing   Problem: Health Behavior/Discharge Planning: Goal: Ability to manage health-related needs will improve Outcome: Progressing   Problem: Clinical Measurements: Goal: Ability to maintain clinical measurements within normal limits will improve Outcome: Progressing Goal: Will remain free from infection Outcome: Progressing Goal: Diagnostic test results will improve Outcome: Progressing Goal: Respiratory complications will improve Outcome: Progressing Goal: Cardiovascular complication will be avoided Outcome: Progressing   Problem: Activity: Goal: Risk for activity intolerance will decrease Outcome: Progressing   Problem: Nutrition: Goal: Adequate nutrition will be maintained Outcome: Progressing   Problem: Coping: Goal: Level of anxiety will decrease Outcome: Progressing   Problem: Elimination: Goal: Will not experience complications related to bowel motility Outcome: Progressing Goal: Will not experience complications related to urinary retention Outcome: Progressing   Problem: Pain Management: Goal: General experience of comfort will improve Outcome: Progressing   Problem: Safety: Goal: Ability to remain free from injury will improve Outcome: Progressing   Problem: Skin Integrity: Goal: Risk for impaired skin integrity will  decrease Outcome: Progressing

## 2023-06-27 NOTE — Assessment & Plan Note (Signed)
Failed outpatient antibiotics twice, received 1 week of Keflex followed by 1 week of doxycycline.  Based on her history of diabetes she was started on cefepime for pseudomonal coverage.  Clinically seems improving. Significant calf tenderness and edema, repeat venous Doppler was ordered, it was negative for DVT 2 weeks ago. -Continue with cefepime -Follow-up venous Doppler results

## 2023-06-27 NOTE — Assessment & Plan Note (Signed)
Diet controlled with most recent A1c of 6.3 -Continue to monitor

## 2023-06-27 NOTE — Assessment & Plan Note (Signed)
Continue statin. 

## 2023-06-27 NOTE — Progress Notes (Signed)
  Progress Note   Patient: Deborah Bartlett ZOX:[id number] DOB: 04-09-1961 DOA: 06/26/2023     0 DOS: the patient was seen and examined on 06/27/2023   Brief hospital course: Taken from H&P.  Deborah Bartlett is a 62 y.o. female with medical history significant of IIDM on diet control, HTN, HLD, sent from PCP office to evaluate of worsening of left leg cellulitis.   Approximately 2 weeks ago patient developed L LE rash and a painful blister filled with clear liquid, along with edema.  Patient was diagnosed with left leg cellulitis at PCP office, DVT studies were negative.  She was started on Keflex for 1 week followed by doxycycline for another week which she completed yesterday.  Due to persistent symptoms and no significant improvement she was sent to ED.  No fever or chills.  Patient was started on cefepime with history of diabetes.  11/5: Vital and lab stable.  MRSA and group A strep PCR negative.  Left lower extremity DVT venous Doppler was done due to significant calf tenderness-pending result. It was -2 weeks ago.   Assessment and Plan: Left leg cellulitis Failed outpatient antibiotics twice, received 1 week of Keflex followed by 1 week of doxycycline.  Based on her history of diabetes she was started on cefepime for pseudomonal coverage.  Clinically seems improving. Significant calf tenderness and edema, repeat venous Doppler was ordered, it was negative for DVT 2 weeks ago. -Continue with cefepime -Follow-up venous Doppler results  Essential hypertension, benign -Continue home amlodipine and losartan  Diabetes mellitus (HCC) Diet controlled with most recent A1c of 6.3 -Continue to monitor  Hypercholesterolemia -Continue statin  GERD (gastroesophageal reflux disease) -Continue PPI   Subjective: Patient continued to have significant pain and tenderness on left lower leg.  Physical Exam: Vitals:   06/26/23 2010 06/26/23 2016 06/27/23 0504 06/27/23 0739  BP:  126/83 132/81  (!) 140/85  Pulse:  94 85 84  Resp:  18  16  Temp:  98.6 F (37 C) 98 F (36.7 C)   TempSrc:      SpO2:  97% 97% 97%  Weight: 71.2 kg     Height: 5' (1.524 m)      General.  Overweight lady, in no acute distress. Pulmonary.  Lungs clear bilaterally, normal respiratory effort. CV.  Regular rate and rhythm, no JVD, rub or murmur. Abdomen.  Soft, nontender, nondistended, BS positive. CNS.  Alert and oriented .  No focal neurologic deficit. Extremities.  L LE with edema, erythema involving anterior surface and significant calf tenderness, pulses intact and symmetrical. Psychiatry.  Judgment and insight appears normal.   Data Reviewed: Prior data reviewed  Family Communication: Discussed with patient  Disposition: Status is: Inpatient Remains inpatient appropriate because: For IV antibiotic use  Planned Discharge Destination: Home  DVT prophylaxis.  Lovenox Time spent: 45 minutes  This record has been created using Conservation officer, historic buildings. Errors have been sought and corrected,but may not always be located. Such creation errors do not reflect on the standard of care.   Author: Arnetha Courser, MD 06/27/2023 3:17 PM  For on call review [email or web address].

## 2023-06-27 NOTE — Assessment & Plan Note (Signed)
Continue PPI ?

## 2023-06-27 NOTE — Hospital Course (Addendum)
Taken from H&P.  Deborah Bartlett is a 62 y.o. female with medical history significant of IIDM on diet control, HTN, HLD, sent from PCP office to evaluate of worsening of left leg cellulitis.   Approximately 2 weeks ago patient developed L LE rash and a painful blister filled with clear liquid, along with edema.  Patient was diagnosed with left leg cellulitis at PCP office, DVT studies were negative.  She was started on Keflex for 1 week followed by doxycycline for another week which she completed yesterday.  Due to persistent symptoms and no significant improvement she was sent to ED.  No fever or chills.  Patient was started on cefepime with history of diabetes.  11/5: Vital and lab stable.  MRSA and group A strep PCR negative.  Left lower extremity DVT venous Doppler was done due to significant calf tenderness-pending result. It was -2 weeks ago.

## 2023-06-28 DIAGNOSIS — L03116 Cellulitis of left lower limb: Secondary | ICD-10-CM | POA: Diagnosis not present

## 2023-06-28 LAB — GLUCOSE, CAPILLARY
Glucose-Capillary: 101 mg/dL — ABNORMAL HIGH (ref 70–99)
Glucose-Capillary: 89 mg/dL (ref 70–99)

## 2023-06-28 NOTE — Plan of Care (Signed)
  Problem: Clinical Measurements: Goal: Ability to avoid or minimize complications of infection will improve Outcome: Progressing   Problem: Skin Integrity: Goal: Skin integrity will improve Outcome: Progressing   Problem: Education: Goal: Knowledge of General Education information will improve Description: Including pain rating scale, medication(s)/side effects and non-pharmacologic comfort measures Outcome: Progressing   Problem: Education: Goal: Knowledge of General Education information will improve Description: Including pain rating scale, medication(s)/side effects and non-pharmacologic comfort measures Outcome: Progressing   Problem: Health Behavior/Discharge Planning: Goal: Ability to manage health-related needs will improve Outcome: Progressing   Problem: Clinical Measurements: Goal: Ability to maintain clinical measurements within normal limits will improve Outcome: Progressing Goal: Will remain free from infection Outcome: Progressing Goal: Diagnostic test results will improve Outcome: Progressing Goal: Respiratory complications will improve Outcome: Progressing Goal: Cardiovascular complication will be avoided Outcome: Progressing   Problem: Activity: Goal: Risk for activity intolerance will decrease Outcome: Progressing   Problem: Nutrition: Goal: Adequate nutrition will be maintained Outcome: Progressing   Problem: Coping: Goal: Level of anxiety will decrease Outcome: Progressing   Problem: Elimination: Goal: Will not experience complications related to bowel motility Outcome: Progressing Goal: Will not experience complications related to urinary retention Outcome: Progressing   Problem: Pain Management: Goal: General experience of comfort will improve Outcome: Progressing   Problem: Safety: Goal: Ability to remain free from injury will improve Outcome: Progressing   Problem: Skin Integrity: Goal: Risk for impaired skin integrity will  decrease Outcome: Progressing

## 2023-06-28 NOTE — Progress Notes (Addendum)
Progress Note    Deborah Bartlett  DGU:[id number] DOB: Jul 09, 1961  DOA: 06/26/2023 PCP: Dale [place], MD      Brief Narrative:    Medical records reviewed and are as summarized below:   Deborah Bartlett is a 62 y.o. female with medical history significant of for type II DM on diet control, HTN, HLD, sent from PCP office because of worsening left leg cellulitis.   Approximately 2 weeks prior to admission, patient developed left leg rash and a painful blister filled with clear liquid, along with edema.  Patient was diagnosed with left leg cellulitis at PCP office.  Venous duplex of the lower extremity was negative for DVT.  She was started on Keflex for 1 week followed by doxycycline for another week which she completed a day prior to admission.  Due to persistent symptoms and no significant improvement she was sent to ED.  No fever or chills.  Patient was started on cefepime with history of diabetes.  11/5: Vital and lab stable.  MRSA and group A strep PCR negative.  Left lower extremity DVT venous Doppler was done due to significant calf tenderness but this was negative for DVT.        Assessment/Plan:   Principal Problem:   Left leg cellulitis Active Problems:   Essential hypertension, benign   Diabetes mellitus (HCC)   Hypercholesterolemia   GERD (gastroesophageal reflux disease)    Body mass index is 30.66 kg/m.  (Obesity)   Left leg and foot cellulitis: Continue IV cefepime.  Analgesics as needed for pain. Venous duplex of the left lower extremity was negative for DVT. Of note, previously completed 1 week of Keflex followed by 1 week of doxycycline.   Type II DM: Glucose levels are adequate.  Hemoglobin A1c was 6.3 on 03/09/2023.   Hypertension: Continue amlodipine, carvedilol and losartan.   Other comorbidities include hypercholesterolemia, GERD    Diet Order             Diet heart healthy/carb modified Fluid consistency: Thin  Diet effective  now                            Consultants: None  Procedures: None    Medications:    amLODipine  5 mg Oral BID   aspirin EC  81 mg Oral Daily   carvedilol  12.5 mg Oral BID WC   enoxaparin (LOVENOX) injection  40 mg Subcutaneous Q24H   fesoterodine  4 mg Oral Daily   losartan  100 mg Oral Daily   magnesium oxide  200 mg Oral QHS   pantoprazole  40 mg Oral Daily   rosuvastatin  40 mg Oral Daily   Continuous Infusions:  ceFEPime (MAXIPIME) IV 2 g (06/28/23 0840)     Anti-infectives (From admission, onward)    Start     Dose/Rate Route Frequency Ordered Stop   06/26/23 1500  ceFEPIme (MAXIPIME) 2 g in sodium chloride 0.9 % 100 mL IVPB        2 g 200 mL/hr over 30 Minutes Intravenous Every 8 hours 06/26/23 1402     06/26/23 1315  ceFEPIme (MAXIPIME) 2 g in sodium chloride 0.9 % 100 mL IVPB  Status:  Discontinued        2 g 200 mL/hr over 30 Minutes Intravenous  Once 06/26/23 1217 06/26/23 1402              Family Communication/Anticipated D/C  date and plan/Code Status   DVT prophylaxis: enoxaparin (LOVENOX) injection 40 mg Start: 06/26/23 2200     Code Status: Full Code  Family Communication: None Disposition Plan: Plan to discharge home   Status is: Inpatient Remains inpatient appropriate because: IV antibiotics for left leg cellulitis       Subjective:   Interval events noted.  She complains of pain and swelling in the left leg.  Objective:    Vitals:   06/27/23 1553 06/27/23 2002 06/28/23 0512 06/28/23 0805  BP: 128/83 117/72 (!) 143/81 (!) 140/82  Pulse: 86 90 87 85  Resp: 16   16  Temp:  98 F (36.7 C) 98.1 F (36.7 C) 98.2 F (36.8 C)  TempSrc:      SpO2: 94% 98% 98% 96%  Weight:      Height:       No data found.  No intake or output data in the 24 hours ending 06/28/23 1405 Filed Weights   06/26/23 2010  Weight: 71.2 kg    Exam:  GEN: NAD SKIN: Warm and dry EYES: No pallor or icterus ENT: MMM CV:  RRR PULM: CTA B ABD: soft, obese, NT, +BS CNS: AAO x 3, non focal EXT: Left leg and foot swelling, tenderness and erythema.  An area of blister noted on the medial aspect of the distal left foot/calcaneus.               Data Reviewed:   I have personally reviewed following labs and imaging studies:  Labs: Labs show the following:   Basic Metabolic Panel: Recent Labs  Lab 06/26/23 1157  NA 141  K 3.9  CL 107  CO2 26  GLUCOSE 101*  BUN 16  CREATININE 0.86  CALCIUM 10.1   GFR Estimated Creatinine Clearance: 60.5 mL/min (by C-G formula based on SCr of 0.86 mg/dL). Liver Function Tests: No results for input(s): "AST", "ALT", "ALKPHOS", "BILITOT", "PROT", "ALBUMIN" in the last 168 hours. No results for input(s): "LIPASE", "AMYLASE" in the last 168 hours. No results for input(s): "AMMONIA" in the last 168 hours. Coagulation profile No results for input(s): "INR", "PROTIME" in the last 168 hours.  CBC: Recent Labs  Lab 06/26/23 1157  WBC 5.5  HGB 12.2  HCT 38.2  MCV 91.8  PLT 370   Cardiac Enzymes: No results for input(s): "CKTOTAL", "CKMB", "CKMBINDEX", "TROPONINI" in the last 168 hours. BNP (last 3 results) No results for input(s): "PROBNP" in the last 8760 hours. CBG: Recent Labs  Lab 06/27/23 1749 06/28/23 0807  GLUCAP 144* 101*   D-Dimer: No results for input(s): "DDIMER" in the last 72 hours. Hgb A1c: No results for input(s): "HGBA1C" in the last 72 hours. Lipid Profile: No results for input(s): "CHOL", "HDL", "LDLCALC", "TRIG", "CHOLHDL", "LDLDIRECT" in the last 72 hours. Thyroid function studies: No results for input(s): "TSH", "T4TOTAL", "T3FREE", "THYROIDAB" in the last 72 hours.  Invalid input(s): "FREET3" Anemia work up: No results for input(s): "VITAMINB12", "FOLATE", "FERRITIN", "TIBC", "IRON", "RETICCTPCT" in the last 72 hours. Sepsis Labs: Recent Labs  Lab 06/26/23 1157  WBC 5.5    Microbiology Recent Results (from the  past 240 hour(s))  MRSA Next Gen by PCR, Nasal     Status: None   Collection Time: 06/26/23 11:45 AM   Specimen: Throat; Nasal Swab  Result Value Ref Range Status   MRSA by PCR Next Gen NOT DETECTED NOT DETECTED Final    Comment: (NOTE) The GeneXpert MRSA Assay (FDA approved for NASAL specimens only),  is one component of a comprehensive MRSA colonization surveillance program. It is not intended to diagnose MRSA infection nor to guide or monitor treatment for MRSA infections. Test performance is not FDA approved in patients less than 10 years old. Performed at [facility], [address] [place], [place] [address]   Group A Strep by PCR     Status: None   Collection Time: 06/26/23 11:45 AM   Specimen: Throat; Sterile Swab  Result Value Ref Range Status   Group A Strep by PCR NOT DETECTED NOT DETECTED Final    Comment: Performed at [facility], [address] [place], [place] [address]    Procedures and diagnostic studies:  US Venous Img Lower Unilateral Left (DVT)  Result Date: 06/27/2023 CLINICAL DATA:  [id number] Edema of left lower leg [id number] Pain of left calf [id number] EXAM: LEFT LOWER EXTREMITY VENOUS DOPPLER ULTRASOUND TECHNIQUE: Gray-scale sonography with compression, as well as color and duplex ultrasound, were performed to evaluate the deep venous system(s) from the level of the common femoral vein through the popliteal and proximal calf veins. COMPARISON:  06/14/2023 FINDINGS: VENOUS Normal compressibility of the common femoral, superficial femoral, and popliteal veins, as well as the visualized calf veins. Visualized portions of profunda femoral vein and great saphenous vein unremarkable. No filling defects to suggest DVT on grayscale or color Doppler imaging. Doppler waveforms show normal direction of venous flow, normal respiratory plasticity and response to augmentation. Limited views of the contralateral common femoral vein are unremarkable. OTHER  None. Limitations: none IMPRESSION: 1. No evidence of deep venous thrombosis within the left lower extremity. Electronically Signed   By: Sharlet Salina M.D.   On: 06/27/2023 15:47               LOS: 1 day   Illiana Losurdo  [facility]   Pager on [email or web address]. If 7PM-7AM, please contact night-coverage at [email or web address]     06/28/2023, 2:05 PM

## 2023-06-29 DIAGNOSIS — L03116 Cellulitis of left lower limb: Secondary | ICD-10-CM | POA: Diagnosis not present

## 2023-06-29 MED ORDER — CALCIUM CARBONATE ANTACID 500 MG PO CHEW: 1.0000 | CHEWABLE_TABLET | Freq: Three times a day (TID) | ORAL | Status: DC | PRN

## 2023-06-29 MED ORDER — CALCIUM CARBONATE ANTACID 500 MG PO CHEW
400.0000 mg | CHEWABLE_TABLET | Freq: Three times a day (TID) | ORAL | Status: DC | PRN
  Administered 2023-06-29 – 2023-06-30 (×2): 400 mg via ORAL
  Filled 2023-06-29 (×2): qty 2

## 2023-06-29 MED ORDER — ONDANSETRON HCL 4 MG/2ML IJ SOLN
4.0000 mg | Freq: Three times a day (TID) | INTRAMUSCULAR | Status: DC | PRN
  Administered 2023-06-30 – 2023-07-01 (×2): 4 mg via INTRAVENOUS
  Filled 2023-06-29 (×2): qty 2

## 2023-06-29 NOTE — Progress Notes (Signed)
Progress Note    Deborah Bartlett  XBJ:[id number] DOB: 1961-06-04  DOA: 06/26/2023 PCP: Dale , MD      Brief Narrative:    Medical records reviewed and are as summarized below:   Deborah Bartlett is a 62 y.o. female with medical history significant of for type II DM on diet control, HTN, HLD, sent from PCP office because of worsening left leg cellulitis.   Approximately 2 weeks prior to admission, patient developed left leg rash and a painful blister filled with clear liquid, along with edema.  Patient was diagnosed with left leg cellulitis at PCP office.  Venous duplex of the lower extremity was negative for DVT.  She was started on Keflex for 1 week followed by doxycycline for another week which she completed a day prior to admission.  Due to persistent symptoms and no significant improvement she was sent to ED.  No fever or chills.  Patient was started on cefepime with history of diabetes.  11/5: Vital and lab stable.  MRSA and group A strep PCR negative.  Left lower extremity DVT venous Doppler was done due to significant calf tenderness but this was negative for DVT.        Assessment/Plan:   Principal Problem:   Left leg cellulitis Active Problems:   Essential hypertension, benign   Diabetes mellitus (HCC)   Hypercholesterolemia   GERD (gastroesophageal reflux disease)    Body mass index is 30.66 kg/m.  (Obesity)   Left leg and foot cellulitis: Continue IV cefepime.  Patient will probably require minimum of 5 days of IV antibiotics in the hospital.  Analgesics as needed for pain. Venous duplex of the left lower extremity was negative for DVT. Of note, previously completed 1 week of Keflex followed by 1 week of doxycycline.   Type II DM: Glucose levels are adequate.  Hemoglobin A1c was 6.3 on 03/09/2023.   Hypertension: Continue amlodipine, carvedilol and losartan.   Nausea: Zofran as needed for nausea.  Tums as needed for acid  reflux.   Other comorbidities include hypercholesterolemia, GERD    Diet Order             Diet heart healthy/carb modified Fluid consistency: Thin  Diet effective now                            Consultants: None  Procedures: None    Medications:    amLODipine  5 mg Oral BID   aspirin EC  81 mg Oral Daily   carvedilol  12.5 mg Oral BID WC   enoxaparin (LOVENOX) injection  40 mg Subcutaneous Q24H   fesoterodine  4 mg Oral Daily   losartan  100 mg Oral Daily   magnesium oxide  200 mg Oral QHS   pantoprazole  40 mg Oral Daily   rosuvastatin  40 mg Oral Daily   Continuous Infusions:  ceFEPime (MAXIPIME) IV 2 g (06/29/23 0618)     Anti-infectives (From admission, onward)    Start     Dose/Rate Route Frequency Ordered Stop   06/26/23 1500  ceFEPIme (MAXIPIME) 2 g in sodium chloride 0.9 % 100 mL IVPB        2 g 200 mL/hr over 30 Minutes Intravenous Every 8 hours 06/26/23 1402     06/26/23 1315  ceFEPIme (MAXIPIME) 2 g in sodium chloride 0.9 % 100 mL IVPB  Status:  Discontinued  2 g 200 mL/hr over 30 Minutes Intravenous  Once 06/26/23 1217 06/26/23 1402              Family Communication/Anticipated D/C date and plan/Code Status   DVT prophylaxis: enoxaparin (LOVENOX) injection 40 mg Start: 06/26/23 2200     Code Status: Full Code  Family Communication: None Disposition Plan: Plan to discharge home   Status is: Inpatient Remains inpatient appropriate because: IV antibiotics for left leg cellulitis       Subjective:   Interval events noted.  She complains of significant pain in the left leg.  Left leg is still swollen and red.  She also complains of nausea.  Objective:    Vitals:   06/28/23 1657 06/28/23 1934 06/29/23 0403 06/29/23 0829  BP: (!) 143/76 (!) 146/91 136/77 124/77  Pulse: 77 82 80 93  Resp:    16  Temp: 98.2 F (36.8 C) 98.2 F (36.8 C) 98.1 F (36.7 C) (!) 97.5 F (36.4 C)  TempSrc:    Oral  SpO2:  98% 97% 97% 97%  Weight:      Height:       No data found.  No intake or output data in the 24 hours ending 06/29/23 1325 Filed Weights   06/26/23 2010  Weight: 71.2 kg    Exam:  GEN: NAD SKIN: Warm and dry EYES: No pallor or icterus ENT: MMM CV: RRR PULM: CTA B ABD: soft, obese, NT, +BS CNS: AAO x 3, non focal EXT: Pain, swelling and redness of the left leg.  Placed on the medial aspect of the distal left foot/calcaneus.            Data Reviewed:   I have personally reviewed following labs and imaging studies:  Labs: Labs show the following:   Basic Metabolic Panel: Recent Labs  Lab 06/26/23 1157  NA 141  K 3.9  CL 107  CO2 26  GLUCOSE 101*  BUN 16  CREATININE 0.86  CALCIUM 10.1   GFR Estimated Creatinine Clearance: 60.5 mL/min (by C-G formula based on SCr of 0.86 mg/dL). Liver Function Tests: No results for input(s): "AST", "ALT", "ALKPHOS", "BILITOT", "PROT", "ALBUMIN" in the last 168 hours. No results for input(s): "LIPASE", "AMYLASE" in the last 168 hours. No results for input(s): "AMMONIA" in the last 168 hours. Coagulation profile No results for input(s): "INR", "PROTIME" in the last 168 hours.  CBC: Recent Labs  Lab 06/26/23 1157  WBC 5.5  HGB 12.2  HCT 38.2  MCV 91.8  PLT 370   Cardiac Enzymes: No results for input(s): "CKTOTAL", "CKMB", "CKMBINDEX", "TROPONINI" in the last 168 hours. BNP (last 3 results) No results for input(s): "PROBNP" in the last 8760 hours. CBG: Recent Labs  Lab 06/27/23 1749 06/28/23 0807 06/28/23 1655  GLUCAP 144* 101* 89   D-Dimer: No results for input(s): "DDIMER" in the last 72 hours. Hgb A1c: No results for input(s): "HGBA1C" in the last 72 hours. Lipid Profile: No results for input(s): "CHOL", "HDL", "LDLCALC", "TRIG", "CHOLHDL", "LDLDIRECT" in the last 72 hours. Thyroid function studies: No results for input(s): "TSH", "T4TOTAL", "T3FREE", "THYROIDAB" in the last 72 hours.  Invalid  input(s): "FREET3" Anemia work up: No results for input(s): "VITAMINB12", "FOLATE", "FERRITIN", "TIBC", "IRON", "RETICCTPCT" in the last 72 hours. Sepsis Labs: Recent Labs  Lab 06/26/23 1157  WBC 5.5    Microbiology Recent Results (from the past 240 hour(s))  MRSA Next Gen by PCR, Nasal     Status: None   Collection  Time: 06/26/23 11:45 AM   Specimen: Throat; Nasal Swab  Result Value Ref Range Status   MRSA by PCR Next Gen NOT DETECTED NOT DETECTED Final    Comment: (NOTE) The GeneXpert MRSA Assay (FDA approved for NASAL specimens only), is one component of a comprehensive MRSA colonization surveillance program. It is not intended to diagnose MRSA infection nor to guide or monitor treatment for MRSA infections. Test performance is not FDA approved in patients less than 54 years old. Performed at [facility], [address] [place], [place] [address]   Group A Strep by PCR     Status: None   Collection Time: 06/26/23 11:45 AM   Specimen: Throat; Sterile Swab  Result Value Ref Range Status   Group A Strep by PCR NOT DETECTED NOT DETECTED Final    Comment: Performed at [facility], [address] [place], [place] [address]    Procedures and diagnostic studies:  No results found.             LOS: 2 days   Nelta Caudill  [facility]   Pager on [email or web address]. If 7PM-7AM, please contact night-coverage at [email or web address]     06/29/2023, 1:25 PM

## 2023-06-29 NOTE — Plan of Care (Signed)
  Problem: Clinical Measurements: Goal: Ability to avoid or minimize complications of infection will improve Outcome: Progressing   Problem: Skin Integrity: Goal: Skin integrity will improve Outcome: Progressing   Problem: Education: Goal: Knowledge of General Education information will improve Description: Including pain rating scale, medication(s)/side effects and non-pharmacologic comfort measures Outcome: Progressing   Problem: Education: Goal: Knowledge of General Education information will improve Description: Including pain rating scale, medication(s)/side effects and non-pharmacologic comfort measures Outcome: Progressing   Problem: Health Behavior/Discharge Planning: Goal: Ability to manage health-related needs will improve Outcome: Progressing   Problem: Clinical Measurements: Goal: Ability to maintain clinical measurements within normal limits will improve Outcome: Progressing Goal: Will remain free from infection Outcome: Progressing Goal: Diagnostic test results will improve Outcome: Progressing Goal: Respiratory complications will improve Outcome: Progressing Goal: Cardiovascular complication will be avoided Outcome: Progressing   Problem: Activity: Goal: Risk for activity intolerance will decrease Outcome: Progressing   Problem: Nutrition: Goal: Adequate nutrition will be maintained Outcome: Progressing   Problem: Coping: Goal: Level of anxiety will decrease Outcome: Progressing   Problem: Elimination: Goal: Will not experience complications related to bowel motility Outcome: Progressing Goal: Will not experience complications related to urinary retention Outcome: Progressing   Problem: Pain Management: Goal: General experience of comfort will improve Outcome: Progressing   Problem: Safety: Goal: Ability to remain free from injury will improve Outcome: Progressing   Problem: Skin Integrity: Goal: Risk for impaired skin integrity will  decrease Outcome: Progressing

## 2023-06-30 ENCOUNTER — Inpatient Hospital Stay: Payer: BC Managed Care – PPO

## 2023-06-30 DIAGNOSIS — L03116 Cellulitis of left lower limb: Secondary | ICD-10-CM | POA: Diagnosis not present

## 2023-06-30 LAB — CBC WITH DIFFERENTIAL/PLATELET
Abs Immature Granulocytes: 0.01 10*3/uL (ref 0.00–0.07)
Basophils Absolute: 0.1 10*3/uL (ref 0.0–0.1)
Basophils Relative: 1 %
Eosinophils Absolute: 0 10*3/uL (ref 0.0–0.5)
Eosinophils Relative: 1 %
HCT: 38.5 % (ref 36.0–46.0)
Hemoglobin: 12.8 g/dL (ref 12.0–15.0)
Immature Granulocytes: 0 %
Lymphocytes Relative: 18 %
Lymphs Abs: 0.7 10*3/uL (ref 0.7–4.0)
MCH: 29.4 pg (ref 26.0–34.0)
MCHC: 33.2 g/dL (ref 30.0–36.0)
MCV: 88.3 fL (ref 80.0–100.0)
Monocytes Absolute: 0.3 10*3/uL (ref 0.1–1.0)
Monocytes Relative: 7 %
Neutro Abs: 2.8 10*3/uL (ref 1.7–7.7)
Neutrophils Relative %: 73 %
Platelets: 390 10*3/uL (ref 150–400)
RBC: 4.36 MIL/uL (ref 3.87–5.11)
RDW: 12.3 % (ref 11.5–15.5)
WBC: 3.9 10*3/uL — ABNORMAL LOW (ref 4.0–10.5)
nRBC: 0 % (ref 0.0–0.2)

## 2023-06-30 LAB — BASIC METABOLIC PANEL
Anion gap: 8 (ref 5–15)
BUN: 17 mg/dL (ref 8–23)
CO2: 26 mmol/L (ref 22–32)
Calcium: 10.5 mg/dL — ABNORMAL HIGH (ref 8.9–10.3)
Chloride: 101 mmol/L (ref 98–111)
Creatinine, Ser: 0.68 mg/dL (ref 0.44–1.00)
GFR, Estimated: >60 mL/min (ref >60)
Glucose, Bld: 134 mg/dL — ABNORMAL HIGH (ref 70–99)
Potassium: 4 mmol/L (ref 3.5–5.1)
Sodium: 135 mmol/L (ref 135–145)

## 2023-06-30 LAB — PROCALCITONIN: Procalcitonin: <0.1 ng/mL

## 2023-06-30 LAB — MAGNESIUM: Magnesium: 2.2 mg/dL (ref 1.7–2.4)

## 2023-06-30 LAB — LACTIC ACID, PLASMA: Lactic Acid, Venous: 0.8 mmol/L (ref 0.5–1.9)

## 2023-06-30 MED ORDER — SULFAMETHOXAZOLE-TRIMETHOPRIM 800-160 MG PO TABS: 1.0000 | ORAL_TABLET | Freq: Two times a day (BID) | ORAL | 0 refills | Status: AC

## 2023-06-30 MED ORDER — LACTATED RINGERS IV SOLN: INTRAVENOUS | Status: DC

## 2023-06-30 MED ORDER — ONDANSETRON HCL 4 MG PO TABS
4.0000 mg | ORAL_TABLET | Freq: Four times a day (QID) | ORAL | Status: DC | PRN
  Administered 2023-06-30: 4 mg via ORAL

## 2023-06-30 NOTE — Progress Notes (Addendum)
Progress Note    Deborah Bartlett  ZOX:[id number] DOB: 06-11-1961  DOA: 06/26/2023 PCP: Dale [place], MD      Brief Narrative:    Medical records reviewed and are as summarized below:   Deborah Bartlett is a 62 y.o. female with medical history significant of for type II DM on diet control, HTN, HLD, sent from PCP office because of worsening left leg cellulitis.   Approximately 2 weeks prior to admission, patient developed left leg rash and a painful blister filled with clear liquid, along with edema.  Patient was diagnosed with left leg cellulitis at PCP office.  Venous duplex of the lower extremity was negative for DVT.  She was started on Keflex for 1 week followed by doxycycline for another week which she completed a day prior to admission.  Due to persistent symptoms and no significant improvement she was sent to ED.  No fever or chills.  Patient was started on cefepime with history of diabetes.  11/5: Vital and lab stable.  MRSA and group A strep PCR negative.  Left lower extremity DVT venous Doppler was done due to significant calf tenderness but this was negative for DVT.        Assessment/Plan:   Principal Problem:   Left leg cellulitis Active Problems:   Essential hypertension, benign   Diabetes mellitus (HCC)   Hypercholesterolemia   GERD (gastroesophageal reflux disease)    Body mass index is 30.66 kg/m.  (Obesity)   Left leg and foot cellulitis: She has a 5 days of IV cefepime.  Discontinue IV cefepime.  Plan to start Bactrim tomorrow.  Analgesics as needed for pain. Venous duplex of the left lower extremity was negative for DVT. Of note, previously completed 1 week of Keflex followed by 1 week of doxycycline. Patient had a history of left lower extremity cellulitis and 2016 and developed left leg lymphedema afterwards.   Nausea and vomiting: Suspect this may be due to her IV antibiotics.  Antiemetics as needed.  Will consider abdominal imaging if  symptoms persist.  Discharge has been canceled and patient will be monitored overnight.  Check CBC and BMP   Type II DM: Glucose levels are adequate.  Hemoglobin A1c was 6.3 on 03/09/2023.   Hypertension: Continue amlodipine, carvedilol and losartan.   Other comorbidities include hypercholesterolemia, GERD    Diet Order             Diet - low sodium heart healthy           Diet Carb Modified           Diet heart healthy/carb modified Fluid consistency: Thin  Diet effective now                            Consultants: None  Procedures: None    Medications:    amLODipine  5 mg Oral BID   aspirin EC  81 mg Oral Daily   carvedilol  12.5 mg Oral BID WC   enoxaparin (LOVENOX) injection  40 mg Subcutaneous Q24H   fesoterodine  4 mg Oral Daily   losartan  100 mg Oral Daily   magnesium oxide  200 mg Oral QHS   pantoprazole  40 mg Oral Daily   rosuvastatin  40 mg Oral Daily   Continuous Infusions:     Anti-infectives (From admission, onward)    Start     Dose/Rate Route Frequency Ordered Stop  06/30/23 0000  sulfamethoxazole-trimethoprim (BACTRIM DS) 800-160 MG tablet        1 tablet Oral 2 times daily 06/30/23 1051 07/05/23 2359   06/26/23 1500  ceFEPIme (MAXIPIME) 2 g in sodium chloride 0.9 % 100 mL IVPB  Status:  Discontinued        2 g 200 mL/hr over 30 Minutes Intravenous Every 8 hours 06/26/23 1402 06/30/23 1219   06/26/23 1315  ceFEPIme (MAXIPIME) 2 g in sodium chloride 0.9 % 100 mL IVPB  Status:  Discontinued        2 g 200 mL/hr over 30 Minutes Intravenous  Once 06/26/23 1217 06/26/23 1402              Family Communication/Anticipated D/C date and plan/Code Status   DVT prophylaxis: enoxaparin (LOVENOX) injection 40 mg Start: 06/26/23 2200     Code Status: Full Code  Family Communication: None Disposition Plan: Plan to discharge home   Status is: Inpatient Remains inpatient appropriate because: Nausea and  vomiting       Subjective:   Interval event noted.  She complains of nausea and vomiting.  She vomited 2 a couple times this morning after discharge order was placed today.  She vomited again in the afternoon.  She moved her bowels yesterday but none today.  No abdominal pain.  Objective:    Vitals:   06/29/23 1557 06/29/23 2002 06/30/23 0454 06/30/23 0700  BP: (!) 139/93 130/83 127/73 (!) 146/81  Pulse: 81 87 80 82  Resp:  12 16 16   Temp: 98.1 F (36.7 C) 97.9 F (36.6 C) 98 F (36.7 C) 98.2 F (36.8 C)  TempSrc:      SpO2: 96% 95% 95% 97%  Weight:      Height:       No data found.   Intake/Output Summary (Last 24 hours) at 06/30/2023 1553 Last data filed at 06/30/2023 0900 Gross per 24 hour  Intake 0 ml  Output --  Net 0 ml   Filed Weights   06/26/23 2010  Weight: 71.2 kg    Exam:  GEN: NAD SKIN: Warm and dry EYES: EOMI ENT: MMM CV: RRR PULM: CTA B ABD: soft, obese, NT, +BS CNS: AAO x 3, non focal EXT: Improvement in left leg and foot tenderness and redness.  There is no swelling of the left leg and foot.         Data Reviewed:   I have personally reviewed following labs and imaging studies:  Labs: Labs show the following:   Basic Metabolic Panel: Recent Labs  Lab 06/26/23 1157  NA 141  K 3.9  CL 107  CO2 26  GLUCOSE 101*  BUN 16  CREATININE 0.86  CALCIUM 10.1   GFR Estimated Creatinine Clearance: 60.5 mL/min (by C-G formula based on SCr of 0.86 mg/dL). Liver Function Tests: No results for input(s): "AST", "ALT", "ALKPHOS", "BILITOT", "PROT", "ALBUMIN" in the last 168 hours. No results for input(s): "LIPASE", "AMYLASE" in the last 168 hours. No results for input(s): "AMMONIA" in the last 168 hours. Coagulation profile No results for input(s): "INR", "PROTIME" in the last 168 hours.  CBC: Recent Labs  Lab 06/26/23 1157  WBC 5.5  HGB 12.2  HCT 38.2  MCV 91.8  PLT 370   Cardiac Enzymes: No results for input(s):  "CKTOTAL", "CKMB", "CKMBINDEX", "TROPONINI" in the last 168 hours. BNP (last 3 results) No results for input(s): "PROBNP" in the last 8760 hours. CBG: Recent Labs  Lab 06/27/23 1749 06/28/23  8119 06/28/23 1655  GLUCAP 144* 101* 89   D-Dimer: No results for input(s): "DDIMER" in the last 72 hours. Hgb A1c: No results for input(s): "HGBA1C" in the last 72 hours. Lipid Profile: No results for input(s): "CHOL", "HDL", "LDLCALC", "TRIG", "CHOLHDL", "LDLDIRECT" in the last 72 hours. Thyroid function studies: No results for input(s): "TSH", "T4TOTAL", "T3FREE", "THYROIDAB" in the last 72 hours.  Invalid input(s): "FREET3" Anemia work up: No results for input(s): "VITAMINB12", "FOLATE", "FERRITIN", "TIBC", "IRON", "RETICCTPCT" in the last 72 hours. Sepsis Labs: Recent Labs  Lab 06/26/23 1157  WBC 5.5    Microbiology Recent Results (from the past 240 hour(s))  MRSA Next Gen by PCR, Nasal     Status: None   Collection Time: 06/26/23 11:45 AM   Specimen: Throat; Nasal Swab  Result Value Ref Range Status   MRSA by PCR Next Gen NOT DETECTED NOT DETECTED Final    Comment: (NOTE) The GeneXpert MRSA Assay (FDA approved for NASAL specimens only), is one component of a comprehensive MRSA colonization surveillance program. It is not intended to diagnose MRSA infection nor to guide or monitor treatment for MRSA infections. Test performance is not FDA approved in patients less than 59 years old. Performed at [facility], [address] [place], [place] [address]   Group A Strep by PCR     Status: None   Collection Time: 06/26/23 11:45 AM   Specimen: Throat; Sterile Swab  Result Value Ref Range Status   Group A Strep by PCR NOT DETECTED NOT DETECTED Final    Comment: Performed at [facility], [address] [place], [place] [address]    Procedures and diagnostic studies:  No results found.             LOS: 3 days   Evany Schecter  [facility]   Pager on [email or web address]. If 7PM-7AM, please contact night-coverage at [email or web address]     06/30/2023, 3:53 PM

## 2023-07-01 DIAGNOSIS — L03116 Cellulitis of left lower limb: Secondary | ICD-10-CM | POA: Diagnosis not present

## 2023-07-01 MED ORDER — SULFAMETHOXAZOLE-TRIMETHOPRIM 800-160 MG PO TABS
1.0000 | ORAL_TABLET | Freq: Two times a day (BID) | ORAL | Status: DC
  Administered 2023-07-01: 1 via ORAL
  Filled 2023-07-01 (×2): qty 1

## 2023-07-01 MED ORDER — SODIUM CHLORIDE 0.9 % IV SOLN
12.5000 mg | Freq: Four times a day (QID) | INTRAVENOUS | Status: DC | PRN
  Administered 2023-07-01: 12.5 mg via INTRAVENOUS
  Filled 2023-07-01: qty 0.5

## 2023-07-01 NOTE — Discharge Summary (Addendum)
Physician Discharge Summary   Patient: Deborah Bartlett MRN: [id number] DOB: 1960/10/21  Admit date:     06/26/2023  Discharge date: 07/01/23  Discharge Physician: Lurene Shadow   PCP: Dale [place], MD   Recommendations at discharge:   Follow-up with PCP in 1 week  Discharge Diagnoses: Principal Problem:   Left leg cellulitis Active Problems:   Essential hypertension, benign   Diabetes mellitus (HCC)   Hypercholesterolemia   GERD (gastroesophageal reflux disease)  Resolved Problems:   * No resolved hospital problems. *  Hospital Course:  Deborah Bartlett is a 62 y.o. female with medical history significant of for type II DM on diet control, HTN, HLD, history of left lower extremity cellulitis in 2016 complicated by left leg lymphedema, who was sent from PCP office because of worsening left leg cellulitis.     Approximately 2 weeks prior to admission, patient developed left leg rash and a painful blister filled with clear liquid, along with edema.  Patient was diagnosed with left leg cellulitis at PCP office.  Venous duplex of the lower extremity was negative for DVT.  She was started on Keflex for 1 week followed by doxycycline for another week which she completed a day prior to admission.  Due to persistent symptoms and no significant improvement she was sent to ED.  No fever or chills.   She was admitted to the hospital for left lower extremity cellulitis.  Patient was started on IV cefepime because of history of diabetes.  Venous duplex of the left lower extremity was negative for DVT.     Assessment and Plan:  Left leg and foot cellulitis, underlying left leg lymphedema: Improved.  She completed 5 days of IV cefepime.  She will be discharged on 5 days of Bactrim.  Venous duplex of the left lower extremity was negative for DVT. Of note, previously completed 1 week of Keflex followed by 1 week of doxycycline.      Nausea and vomiting: Resolved.  She was given IV fluids  overnight.  CBC, BMP, procalcitonin and lactic acid were unremarkable.  Abdominal x-ray was unremarkable.   She already has Zofran at home to be taken as needed.      Type II DM: Glucose levels are adequate.  Hemoglobin A1c was 6.3 on 03/09/2023.  Continue dietary measures for glucose control.     Hypertension: Continue amlodipine, carvedilol and losartan.     Other comorbidities include hypercholesterolemia, GERD   Her condition is improved and she is deemed stable for discharge to home today.  Discharge plan was discussed with the husband and her youngest daughter at the bedside.       Consultants: None Procedures performed: None  Disposition: Home Diet recommendation:  Discharge Diet Orders (From admission, onward)     Start     Ordered   06/30/23 0000  Diet - low sodium heart healthy        06/30/23 1051   06/30/23 0000  Diet Carb Modified        06/30/23 1052           Cardiac diet DISCHARGE MEDICATION: Allergies as of 07/01/2023       Reactions   Codeine Palpitations   dizzy        Medication List     TAKE these medications    amLODipine 5 MG tablet Commonly known as: NORVASC TAKE 1 TABLET BY MOUTH TWICE A DAY   aspirin 81 MG tablet Take 81 mg by mouth daily.  carvedilol 12.5 MG tablet Commonly known as: COREG TAKE 1 TABLET (12.5MG  TOTAL) BY MOUTH TWICE A DAY WITH MEALS   cyanocobalamin 1000 MCG tablet Commonly known as: VITAMIN B12 Take 1,000 mcg by mouth daily.   ferrous sulfate 325 (65 FE) MG tablet Take 325 mg by mouth daily with breakfast.   losartan 100 MG tablet Commonly known as: COZAAR TAKE 1 TABLET BY MOUTH EVERY DAY   Magnesium 250 MG Tabs Take 250 mg by mouth at bedtime.   ondansetron 4 MG tablet Commonly known as: Zofran Take 1 tablet (4 mg total) by mouth every 8 (eight) hours as needed for nausea or vomiting.   onetouch ultrasoft lancets Use as instructed to check Blood sugars 3-4 times a week, twice a day.   Dispense One Touch brand. E11.9   OneTouch Verio test strip Generic drug: glucose blood CHECK BLOOD SUGAR TWICE A DAY 3 TO 4 TIMES A WEEK   pantoprazole 40 MG tablet Commonly known as: PROTONIX TAKE 1 TABLET (40 MG TOTAL) BY MOUTH DAILY TAKE 30 MINUTES BEFORE BREAKFAST   rosuvastatin 40 MG tablet Commonly known as: CRESTOR TAKE 1 TABLET BY MOUTH EVERY DAY   solifenacin 5 MG tablet Commonly known as: VESICARE TAKE 1 TABLET (5 MG TOTAL) BY MOUTH DAILY.   sulfamethoxazole-trimethoprim 800-160 MG tablet Commonly known as: Bactrim DS Take 1 tablet by mouth 2 (two) times daily for 5 days.   vitamin D3 25 MCG tablet Commonly known as: CHOLECALCIFEROL Take 1,000 Units by mouth daily.        Discharge Exam: Filed Weights   06/26/23 2010  Weight: 71.2 kg   GEN: NAD SKIN: Warm and dry  EYES: No pallor or icterus ENT: MMM CV: RRR PULM: CTA B ABD: soft, obese, NT, +BS CNS: AAO x 3, non focal EXT: Left leg edema.  Erythema and tenderness of the left leg have improved   Condition at discharge: good  The results of significant diagnostics from this hospitalization (including imaging, microbiology, ancillary and laboratory) are listed below for reference.   Imaging Studies: DG Abd 2 Views  Result Date: 06/30/2023 CLINICAL DATA:  On medication for emphysema and cellulitis with subsequent nausea and vomiting. EXAM: ABDOMEN - 2 VIEW COMPARISON:  None Available. FINDINGS: The bowel gas pattern is normal. There is no evidence of free air. No radio-opaque calculi or other significant radiographic abnormality is seen. IMPRESSION: Negative. Electronically Signed   By: Aram Candela M.D.   On: 06/30/2023 22:11   US Venous Img Lower Unilateral Left (DVT)  Result Date: 06/27/2023 CLINICAL DATA:  [id number] Edema of left lower leg [id number] [phone] Pain of left calf [id number] EXAM: LEFT LOWER EXTREMITY VENOUS DOPPLER ULTRASOUND TECHNIQUE: Gray-scale sonography with compression, as well as  color and duplex ultrasound, were performed to evaluate the deep venous system(s) from the level of the common femoral vein through the popliteal and proximal calf veins. COMPARISON:  06/14/2023 FINDINGS: VENOUS Normal compressibility of the common femoral, superficial femoral, and popliteal veins, as well as the visualized calf veins. Visualized portions of profunda femoral vein and great saphenous vein unremarkable. No filling defects to suggest DVT on grayscale or color Doppler imaging. Doppler waveforms show normal direction of venous flow, normal respiratory plasticity and response to augmentation. Limited views of the contralateral common femoral vein are unremarkable. OTHER None. Limitations: none IMPRESSION: 1. No evidence of deep venous thrombosis within the left lower extremity. Electronically Signed   By: Sharlet Salina M.D.   On: 06/27/2023 15:47  US Venous Img Lower Unilateral Left (DVT)  Result Date: 06/14/2023 CLINICAL DATA:  Left lower extremity edema EXAM: LEFT LOWER EXTREMITY VENOUS DOPPLER ULTRASOUND TECHNIQUE: Gray-scale sonography with compression, as well as color and duplex ultrasound, were performed to evaluate the deep venous system(s) from the level of the common femoral vein through the popliteal and proximal calf veins. COMPARISON:  None Available. FINDINGS: VENOUS Normal compressibility of the common femoral, superficial femoral, and popliteal veins, as well as the visualized calf veins. Visualized portions of profunda femoral vein and great saphenous vein unremarkable. No filling defects to suggest DVT on grayscale or color Doppler imaging. Doppler waveforms show normal direction of venous flow, normal respiratory plasticity and response to augmentation. Limited views of the contralateral common femoral vein are unremarkable. OTHER Additional focal evaluation of the medial aspect of the left ankle demonstrates a shallow elongated anechoic fluid collection measuring 3.9 x 0.5 x 2.0  cm. Findings are consistent with a focal blister or cyst. Limitations: none IMPRESSION: 1. No evidence of deep venous thrombosis. 2. Superficial simple fluid collection at the medial aspect of the left ankle consistent with a blister or cyst. Electronically Signed   By: Malachy Moan M.D.   On: 06/14/2023 14:42    Microbiology: Results for orders placed or performed during the hospital encounter of 06/26/23  MRSA Next Gen by PCR, Nasal     Status: None   Collection Time: 06/26/23 11:45 AM   Specimen: Throat; Nasal Swab  Result Value Ref Range Status   MRSA by PCR Next Gen NOT DETECTED NOT DETECTED Final    Comment: (NOTE) The GeneXpert MRSA Assay (FDA approved for NASAL specimens only), is one component of a comprehensive MRSA colonization surveillance program. It is not intended to diagnose MRSA infection nor to guide or monitor treatment for MRSA infections. Test performance is not FDA approved in patients less than 18 years old. Performed at [facility], [address] [place], [place] [address]   Group A Strep by PCR     Status: None   Collection Time: 06/26/23 11:45 AM   Specimen: Throat; Sterile Swab  Result Value Ref Range Status   Group A Strep by PCR NOT DETECTED NOT DETECTED Final    Comment: Performed at [facility], [address] [place], [place] [address]    Labs: CBC: Recent Labs  Lab 06/26/23 1157 06/30/23 1711  WBC 5.5 3.9*  NEUTROABS  --  2.8  HGB 12.2 12.8  HCT 38.2 38.5  MCV 91.8 88.3  PLT 370 390   Basic Metabolic Panel: Recent Labs  Lab 06/26/23 1157 06/30/23 1711  NA 141 135  K 3.9 4.0  CL 107 101  CO2 26 26  GLUCOSE 101* 134*  BUN 16 17  CREATININE 0.86 0.68  CALCIUM 10.1 10.5*  MG  --  2.2   Liver Function Tests: No results for input(s): "AST", "ALT", "ALKPHOS", "BILITOT", "PROT", "ALBUMIN" in the last 168 hours. CBG: Recent Labs  Lab 06/27/23 1749 06/28/23 0807 06/28/23 1655  GLUCAP 144* 101* 89     Discharge time spent: greater than 30 minutes.  Signed: Lurene Shadow, MD [facility] 07/01/2023

## 2023-07-04 ENCOUNTER — Ambulatory Visit (INDEPENDENT_AMBULATORY_CARE_PROVIDER_SITE_OTHER): Payer: BC Managed Care – PPO | Admitting: Internal Medicine

## 2023-07-04 ENCOUNTER — Telehealth: Payer: Self-pay

## 2023-07-04 VITALS — BP 122/70 | HR 84 | Temp 98.0°F | Resp 16 | Ht 60.0 in | Wt 154.8 lb

## 2023-07-04 DIAGNOSIS — I10 Essential (primary) hypertension: Secondary | ICD-10-CM | POA: Diagnosis not present

## 2023-07-04 DIAGNOSIS — L03116 Cellulitis of left lower limb: Secondary | ICD-10-CM

## 2023-07-04 DIAGNOSIS — I890 Lymphedema, not elsewhere classified: Secondary | ICD-10-CM | POA: Diagnosis not present

## 2023-07-04 DIAGNOSIS — E1165 Type 2 diabetes mellitus with hyperglycemia: Secondary | ICD-10-CM

## 2023-07-04 NOTE — Transitions of Care (Post Inpatient/ED Visit) (Signed)
07/04/2023  Name: Deborah Bartlett MRN: [id number] DOB: 24-Nov-1960  Today's TOC FU Call Status: Today's TOC FU Call Status:: Successful TOC FU Call Completed TOC FU Call Complete Date: 07/04/23 Patient's Name and Date of Birth confirmed.  Transition Care Management Follow-up Telephone Call Date of Discharge: 07/01/23 [facility]) Type of Discharge: Inpatient Admission Primary Inpatient Discharge Diagnosis:: DM and Cellulitis How have you been since you were released from the hospital?: Better Any questions or concerns?: No  Items Reviewed: Did you receive and understand the discharge instructions provided?: Yes Medications obtained,verified, and reconciled?: Yes (Medications Reviewed) Any new allergies since your discharge?: No Dietary orders reviewed?: NA Do you have support at home?: Yes People in Home: spouse Name of Support/Comfort Primary Source: Deborah Bartlett  Medications Reviewed Today: Medications Reviewed Today     Reviewed by Valentino Nose, RN (Registered Nurse) on 07/04/23 at 1452  Med List Status: <None>   Medication Order Taking? Sig Documenting Provider Last Dose Status Informant  amLODipine (NORVASC) 5 MG tablet [id number] No TAKE 1 TABLET BY MOUTH TWICE A Donne Anon, MD Past Week Active Self  aspirin 81 MG tablet [id number] No Take 81 mg by mouth daily. [provider] Past Week Active Self  carvedilol (COREG) 12.5 MG tablet [id number] No TAKE 1 TABLET (12.5MG  TOTAL) BY MOUTH TWICE A DAY WITH MEALS Dale Clifton, MD Past Week Active Self  ferrous sulfate 325 (65 FE) MG tablet [id number] No Take 325 mg by mouth daily with breakfast. [provider] Past Week Active Self  Lancets (ONETOUCH ULTRASOFT) lancets [id number] No Use as instructed to check Blood sugars 3-4 times a week, twice a day.  Dispense One Touch brand. E11.9 Dale [place], MD Taking Active Self  losartan (COZAAR) 100 MG tablet  [id number] No TAKE 1 TABLET BY MOUTH EVERY DAY Dale Tysons, MD Past Week Active Self  Magnesium 250 MG TABS [id number] No Take 250 mg by mouth at bedtime. [provider] Past Week Active Self  ondansetron (ZOFRAN) 4 MG tablet [id number] No Take 1 tablet (4 mg total) by mouth every 8 (eight) hours as needed for nausea or vomiting. Sherlene Shams, MD prn unknown Active Self  ONETOUCH VERIO test strip [id number] No CHECK BLOOD SUGAR TWICE A DAY 3 TO 4 TIMES A WEEK Worthy Rancher B, FNP Taking Active Self  pantoprazole (PROTONIX) 40 MG tablet [id number] No TAKE 1 TABLET (40 MG TOTAL) BY MOUTH DAILY TAKE 30 MINUTES BEFORE Roena Malady, MD Past Week Active Self  rosuvastatin (CRESTOR) 40 MG tablet [id number] No TAKE 1 TABLET BY MOUTH EVERY DAY Dale , MD Past Week Active Self  solifenacin (VESICARE) 5 MG tablet [id number] No TAKE 1 TABLET (5 MG TOTAL) BY MOUTH DAILY. Glori Luis, MD Past Week Active Self  sulfamethoxazole-trimethoprim (BACTRIM DS) 800-160 MG tablet [id number]  Take 1 tablet by mouth 2 (two) times daily for 5 days. Lurene Shadow, MD  Active   vitamin B-12 (CYANOCOBALAMIN) 1000 MCG tablet [id number] No Take 1,000 mcg by mouth daily. [provider] Past Week Active Self  Vitamin D3 (VITAMIN D) 25 MCG tablet [id number] No Take 1,000 Units by mouth daily. [provider] Past Week Active Self            Home Care and Equipment/Supplies: Were Home Health Services Ordered?: No Any new equipment or medical supplies ordered?: No  Functional Questionnaire: Do you need assistance with bathing/showering or dressing?: No Do you need assistance  with meal preparation?: Yes Do you need assistance with eating?: No Do you have difficulty maintaining continence: No Do you have difficulty managing or taking your medications?: No  Follow up appointments reviewed: PCP Follow-up appointment confirmed?: Yes Date of PCP follow-up appointment?:  07/04/23 Follow-up Provider: Dr. Lorin Picket Specialist [facility] Follow-up appointment confirmed?: NA Do you need transportation to your follow-up appointment?: No Do you understand care options if your condition(s) worsen?: Yes-patient verbalized understanding    SIGNATURE: Valentino Nose, RN

## 2023-07-04 NOTE — Telephone Encounter (Signed)
Pt evaluated 07/04/23 - see note.

## 2023-07-04 NOTE — Progress Notes (Signed)
Subjective:    Patient ID: Deborah Bartlett, female    DOB: 09-Apr-1961, 62 y.o.   MRN: [id number]  Patient here for  Chief Complaint  Patient presents with   Hospitalization Follow-up    HPI Here for hospitalized - hospitalized 06/26/23 - 07/01/23 - with left leg cellulitis and lymphedema. Was started on IV cefepime.  Venous duplex - negative for DVT. Completed 5 days of IV cefepime. Discharged on 5 days of bactrim. While in the hospital, experienced some nausea and vomiting.  Was given IVFs. Abdominal xray unremarkable.  Lactic acid normal.  GI issues resolved. Discharged home on oral abx.  Comes in today with persistent lower extremity swelling.  Redness has improved. Wound - heel - improved. No surrounding erythema.  Pain now as bad, but still with increased swelling and discomfort. Trying to keep her leg elevated. No fever. Eating.    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   CESAREAN SECTION  1990, 1993, 1999   COLONOSCOPY  2014   Dr Lemar Livings   ORIF ELBOW FRACTURE Left 05/26/2020   Procedure: OPEN REDUCTION INTERNAL FIXATION (ORIF) ELBOW/OLECRANON FRACTURE;  Surgeon: Christena Flake, MD;  Location: [facility];  Service: Orthopedics;  Laterality: Left;   SHOULDER ARTHROSCOPY WITH ROTATOR CUFF REPAIR Right 02/21/2018   Procedure: SHOULDER ARTHROSCOPY WITH DEBRIDEMENT DECOMPRESSION AND REPAIR OF ROTATOR CUFF TEAR;  Surgeon: Christena Flake, MD;  Location: [facility];  Service: Orthopedics;  Laterality: Right;  Diabetic - diet controlled   Family History  Problem Relation Age of Onset   Hypertension Mother    Diabetes Mother    Diabetes Maternal  Grandmother    Diabetes Maternal Grandfather    Breast cancer Neg Hx    Colon cancer Neg Hx    Social History   Socioeconomic History   Marital status: Married    Spouse name: Not on file   Number of children: Not on file   Years of education: Not on file   Highest education level: 12th grade  Occupational History   Not on file  Tobacco Use   Smoking status: Never   Smokeless tobacco: Never  Vaping Use   Vaping status: Never Used  Substance and Sexual Activity   Alcohol use: No    Alcohol/week: 0.0 standard drinks of alcohol   Drug use: No   Sexual activity: Not on file  Other Topics Concern   Not on file  Social History Narrative   Not on file   Social Determinants of Health   Financial Resource Strain: Low Risk  (03/01/2023)   Overall Financial Resource Strain (CARDIA)    Difficulty of Paying Living Expenses: Not hard at all  Food Insecurity: Unknown (06/26/2023)   Hunger Vital Sign    Worried About Running Out of Food in the Last Year: Never true    Ran Out of Food in the Last Year: Patient declined  Transportation Needs: No Transportation Needs (06/26/2023)   PRAPARE - Administrator, Civil Service (Medical): No    Lack of Transportation (Non-Medical): No  Physical Activity: Insufficiently Active (03/01/2023)   Exercise Vital Sign  Days of Exercise per Week: 2 days    Minutes of Exercise per Session: 20 min  Stress: No Stress Concern Present (03/01/2023)   [employer] of Occupational Health - Occupational Stress Questionnaire    Feeling of Stress : Only a little  Social Connections: Socially Integrated (03/01/2023)   Social Connection and Isolation Panel [NHANES]    Frequency of Communication with Friends and Family: More than three times a week    Frequency of Social Gatherings with Friends and Family: Three times a week    Attends Religious Services: More than 4 times per year    Active Member of Clubs or Organizations: Yes    Attends Tax inspector Meetings: 1 to 4 times per year    Marital Status: Married     Review of Systems  Constitutional:  Negative for fever and unexpected weight change.  HENT:  Negative for congestion and sinus pressure.   Respiratory:  Negative for cough, chest tightness and shortness of breath.   Cardiovascular:  Positive for leg swelling. Negative for chest pain and palpitations.  Gastrointestinal:  Negative for abdominal pain and vomiting.  Genitourinary:  Negative for difficulty urinating and dysuria.  Musculoskeletal:  Negative for back pain and myalgias.  Skin:        Wound - heel - open lesion. No surrounding erythema.   Neurological:  Negative for dizziness and headaches.  Psychiatric/Behavioral:  Negative for agitation and dysphoric mood.        Objective:     BP 122/70   Pulse 84   Temp 98 F (36.7 C)   Resp 16   Ht 5' (1.524 m)   Wt 154 lb 12.8 oz (70.2 kg)   LMP 02/13/2013   SpO2 98%   BMI 30.23 kg/m  Wt Readings from Last 3 Encounters:  07/04/23 154 lb 12.8 oz (70.2 kg)  06/26/23 156 lb 15.5 oz (71.2 kg)  06/26/23 157 lb (71.2 kg)    Physical Exam Vitals reviewed.  Constitutional:      General: She is not in acute distress.    Appearance: Normal appearance.  HENT:     Head: Normocephalic and atraumatic.     Right Ear: External ear normal.     Left Ear: External ear normal.  Eyes:     General: No scleral icterus.       Right eye: No discharge.        Left eye: No discharge.     Conjunctiva/sclera: Conjunctivae normal.  Neck:     Thyroid: No thyromegaly.  Cardiovascular:     Rate and Rhythm: Normal rate and regular rhythm.  Pulmonary:     Effort: No respiratory distress.     Breath sounds: Normal breath sounds. No wheezing.  Abdominal:     General: Bowel sounds are normal.     Palpations: Abdomen is soft.     Tenderness: There is no abdominal tenderness.  Musculoskeletal:     Cervical back: Neck supple. No tenderness.     Comments: Persistent  increased lower extremity swelling and lymphedema (pedal edema). Improved erythema.   Lymphadenopathy:     Cervical: No cervical adenopathy.  Skin:    Findings: No erythema or rash.  Neurological:     Mental Status: She is alert.  Psychiatric:        Mood and Affect: Mood normal.        Behavior: Behavior normal.           Outpatient Encounter Medications as  of 07/04/2023  Medication Sig   amLODipine (NORVASC) 5 MG tablet TAKE 1 TABLET BY MOUTH TWICE A DAY   aspirin 81 MG tablet Take 81 mg by mouth daily.   carvedilol (COREG) 12.5 MG tablet TAKE 1 TABLET (12.5MG  TOTAL) BY MOUTH TWICE A DAY WITH MEALS   ferrous sulfate 325 (65 FE) MG tablet Take 325 mg by mouth daily with breakfast.   Lancets (ONETOUCH ULTRASOFT) lancets Use as instructed to check Blood sugars 3-4 times a week, twice a day.  Dispense One Touch brand. E11.9   losartan (COZAAR) 100 MG tablet TAKE 1 TABLET BY MOUTH EVERY DAY   Magnesium 250 MG TABS Take 250 mg by mouth at bedtime.   ondansetron (ZOFRAN) 4 MG tablet Take 1 tablet (4 mg total) by mouth every 8 (eight) hours as needed for nausea or vomiting.   ONETOUCH VERIO test strip CHECK BLOOD SUGAR TWICE A DAY 3 TO 4 TIMES A WEEK   pantoprazole (PROTONIX) 40 MG tablet TAKE 1 TABLET (40 MG TOTAL) BY MOUTH DAILY TAKE 30 MINUTES BEFORE BREAKFAST   rosuvastatin (CRESTOR) 40 MG tablet TAKE 1 TABLET BY MOUTH EVERY DAY   solifenacin (VESICARE) 5 MG tablet TAKE 1 TABLET (5 MG TOTAL) BY MOUTH DAILY.   [EXPIRED] sulfamethoxazole-trimethoprim (BACTRIM DS) 800-160 MG tablet Take 1 tablet by mouth 2 (two) times daily for 5 days.   vitamin B-12 (CYANOCOBALAMIN) 1000 MCG tablet Take 1,000 mcg by mouth daily.   Vitamin D3 (VITAMIN D) 25 MCG tablet Take 1,000 Units by mouth daily.   No facility-administered encounter medications on file as of 07/04/2023.     Lab Results  Component Value Date   WBC 3.9 (L) 06/30/2023   HGB 12.8 06/30/2023   HCT 38.5 06/30/2023   PLT 390  06/30/2023   GLUCOSE 134 (H) 06/30/2023   CHOL 171 03/09/2023   TRIG 214.0 (H) 03/09/2023   HDL 48.70 03/09/2023   LDLDIRECT 83.0 03/09/2023   LDLCALC 97 06/01/2022   ALT 20 03/09/2023   AST 16 03/09/2023   NA 135 06/30/2023   K 4.0 06/30/2023   CL 101 06/30/2023   CREATININE 0.68 06/30/2023   BUN 17 06/30/2023   CO2 26 06/30/2023   TSH 3.13 06/01/2022   INR 1.0 11/02/2020   HGBA1C 6.3 03/09/2023   MICROALBUR <0.7 07/26/2022    US Venous Img Lower Unilateral Left (DVT)  Result Date: 06/27/2023 CLINICAL DATA:  [id number] Edema of left lower leg [id number] Pain of left calf [id number] EXAM: LEFT LOWER EXTREMITY VENOUS DOPPLER ULTRASOUND TECHNIQUE: Gray-scale sonography with compression, as well as color and duplex ultrasound, were performed to evaluate the deep venous system(s) from the level of the common femoral vein through the popliteal and proximal calf veins. COMPARISON:  06/14/2023 FINDINGS: VENOUS Normal compressibility of the common femoral, superficial femoral, and popliteal veins, as well as the visualized calf veins. Visualized portions of profunda femoral vein and great saphenous vein unremarkable. No filling defects to suggest DVT on grayscale or color Doppler imaging. Doppler waveforms show normal direction of venous flow, normal respiratory plasticity and response to augmentation. Limited views of the contralateral common femoral vein are unremarkable. OTHER None. Limitations: none IMPRESSION: 1. No evidence of deep venous thrombosis within the left lower extremity. Electronically Signed   By: Sharlet Salina M.D.   On: 06/27/2023 15:47       Assessment & Plan:  Cellulitis of left lower extremity Assessment & Plan: Persistent increased swelling and discomfort - lower lower  extremity. Persistent open lesion/wound - heel.  Decreased erythema.  Continue bactrim.  Recent lower extremity ultrasound negative for DVT.  Discussed persistent swelling.  Continue leg elevation.  Has  lymphedema pump. Instructed to use the pump daily.  Unable to get compression hose on - due to heel wound.  Heel wound dressed.  Discussed with [facility].  Appt made for further treatment of lower extremity swelling/lymphedema. Discussed unna wraps, etc.     Type 2 diabetes mellitus with hyperglycemia, without long-term current use of insulin (HCC) Assessment & Plan: Diabetic. Low carb diet.  Follow met b and A1c.   Lab Results  Component Value Date   HGBA1C 6.3 03/09/2023      Essential hypertension, benign Assessment & Plan: Continues on Coreg, losartan and amlodipine. On coreg 12.5mg  bid. Blood pressures as outlined.    Lymphedema Assessment & Plan: Discussed using lymphedema pump daily to help with swelling.  F/u with [facility] as discussed.       Dale [place], MD

## 2023-07-05 ENCOUNTER — Telehealth: Payer: Self-pay | Admitting: Internal Medicine

## 2023-07-05 NOTE — Telephone Encounter (Signed)
Patient aware of below,

## 2023-07-05 NOTE — Telephone Encounter (Signed)
Please call Deborah Bartlett and notify her that I did contact vascular.  They are going to work on scheduling an appt. Should be reaching out to her.   Did agree with using the pumps daily.  Keep [place] posted on how her leg is doing.

## 2023-07-09 ENCOUNTER — Encounter: Payer: Self-pay | Admitting: Internal Medicine

## 2023-07-09 NOTE — Assessment & Plan Note (Signed)
 Continues on Coreg, losartan and amlodipine. On coreg 12.5mg  bid. Blood pressures as outlined.

## 2023-07-09 NOTE — Assessment & Plan Note (Signed)
Diabetic. Low carb diet.  Follow met b and A1c.   Lab Results  Component Value Date   HGBA1C 6.3 03/09/2023

## 2023-07-09 NOTE — Assessment & Plan Note (Signed)
Persistent increased swelling and discomfort - lower lower extremity. Persistent open lesion/wound - heel.  Decreased erythema.  Continue bactrim.  Recent lower extremity ultrasound negative for DVT.  Discussed persistent swelling.  Continue leg elevation.  Has lymphedema pump. Instructed to use the pump daily.  Unable to get compression hose on - due to heel wound.  Heel wound dressed.  Discussed with [facility].  Appt made for further treatment of lower extremity swelling/lymphedema. Discussed unna wraps, etc.

## 2023-07-09 NOTE — Assessment & Plan Note (Signed)
Discussed using lymphedema pump daily to help with swelling.  F/u with [facility] as discussed.

## 2023-07-10 ENCOUNTER — Encounter: Payer: Self-pay | Admitting: Internal Medicine

## 2023-07-10 NOTE — Telephone Encounter (Signed)
FYI

## 2023-07-10 NOTE — Telephone Encounter (Signed)
Reviewed. She has appt with Dr Wyn Quaker this week.  Let us know if any worsening /change. Waiting for papers to complete for her work.

## 2023-07-11 ENCOUNTER — Telehealth: Payer: Self-pay | Admitting: Internal Medicine

## 2023-07-11 NOTE — Telephone Encounter (Signed)
Patient's husband dropped off paper work for Dr Lorin Picket to sign. [employer] leave of absence paper work is up front in Dr [employer]. Call patient when ready for pick up.

## 2023-07-11 NOTE — Telephone Encounter (Signed)
Completed some of the form and placed in your forms folder to complete and sign

## 2023-07-12 NOTE — Telephone Encounter (Signed)
Signed and placed in box.   

## 2023-07-13 NOTE — Telephone Encounter (Signed)
The swelling is down and the place on the side looks some better. Swelling on top of foot. Patient states leg is still red. Patient would like to know if you would like some pictures?

## 2023-07-13 NOTE — Telephone Encounter (Signed)
Please call and confirm she is doing ok.  Also see if can get an earlier appt with [facility] (is she agreeable to see anyone there?).  If feel needs to be reevaluated prior to [facility] visit, can work in.

## 2023-07-13 NOTE — Telephone Encounter (Signed)
Called and spoke with pt to let her know that forms are ready for pick up.  Made copy for chart.  Placed in box for pick up.  Pt reported that she would pick them up tomorrow while out for another appointment.

## 2023-07-13 NOTE — Telephone Encounter (Signed)
Please call and see how her leg is doing.

## 2023-07-14 ENCOUNTER — Encounter (INDEPENDENT_AMBULATORY_CARE_PROVIDER_SITE_OTHER): Payer: Self-pay | Admitting: Vascular Surgery

## 2023-07-14 NOTE — Telephone Encounter (Signed)
Doing ok. Scheduled with Dr Lorin Picket per her request. Pt stated she took the first available with [facility] with Dr Lorretta Harp.

## 2023-07-18 ENCOUNTER — Encounter: Payer: Self-pay | Admitting: Internal Medicine

## 2023-07-18 ENCOUNTER — Ambulatory Visit (INDEPENDENT_AMBULATORY_CARE_PROVIDER_SITE_OTHER): Payer: BC Managed Care – PPO | Admitting: Internal Medicine

## 2023-07-18 VITALS — BP 124/72 | HR 86 | Temp 97.7°F | Ht 60.0 in | Wt 159.6 lb

## 2023-07-18 DIAGNOSIS — I10 Essential (primary) hypertension: Secondary | ICD-10-CM | POA: Diagnosis not present

## 2023-07-18 DIAGNOSIS — L03116 Cellulitis of left lower limb: Secondary | ICD-10-CM

## 2023-07-18 DIAGNOSIS — E1165 Type 2 diabetes mellitus with hyperglycemia: Secondary | ICD-10-CM

## 2023-07-18 DIAGNOSIS — I890 Lymphedema, not elsewhere classified: Secondary | ICD-10-CM | POA: Diagnosis not present

## 2023-07-18 NOTE — Progress Notes (Signed)
Subjective:    Patient ID: Deborah Bartlett, female    DOB: 1961/07/21, 62 y.o.   MRN: [id number]  Patient here for  Chief Complaint  Patient presents with   Medical Management of Chronic Issues    HPI Here for work in appt - work in to follow up regarding lower extremity swelling and previous cellulitis. Hospitalized 06/26/23 - 07/01/23 - with left leg cellulitis and lymphedema. Treated with abx.  Venous duplex - negative for DVT. Since her last visit here, she has been keeping her legs elevated, using lymphedema pump and wearing compression hose.  Heel lesion has improved.  Decreased redness.  Swelling some better.  No fever. Eating. No nausea or vomiting. Planning for f/u with vascular next week.    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   CESAREAN SECTION  1990, 1993, 1999   COLONOSCOPY  2014   Dr Lemar Livings   ORIF ELBOW FRACTURE Left 05/26/2020   Procedure: OPEN REDUCTION INTERNAL FIXATION (ORIF) ELBOW/OLECRANON FRACTURE;  Surgeon: Christena Flake, MD;  Location: [facility];  Service: Orthopedics;  Laterality: Left;   SHOULDER ARTHROSCOPY WITH ROTATOR CUFF REPAIR Right 02/21/2018   Procedure: SHOULDER ARTHROSCOPY WITH DEBRIDEMENT DECOMPRESSION AND REPAIR OF ROTATOR CUFF TEAR;  Surgeon: Christena Flake, MD;  Location: [facility];  Service: Orthopedics;  Laterality: Right;  Diabetic - diet controlled   Family History  Problem Relation Age of Onset   Hypertension Mother    Diabetes Mother    Diabetes Maternal Grandmother    Diabetes Maternal Grandfather    Breast cancer Neg Hx    Colon cancer Neg Hx    Social History   Socioeconomic History    Marital status: Married    Spouse name: Not on file   Number of children: Not on file   Years of education: Not on file   Highest education level: 12th grade  Occupational History   Not on file  Tobacco Use   Smoking status: Never   Smokeless tobacco: Never  Vaping Use   Vaping status: Never Used  Substance and Sexual Activity   Alcohol use: No    Alcohol/week: 0.0 standard drinks of alcohol   Drug use: No   Sexual activity: Not on file  Other Topics Concern   Not on file  Social History Narrative   Not on file   Social Determinants of Health   Financial Resource Strain: Low Risk  (03/01/2023)   Overall Financial Resource Strain (CARDIA)    Difficulty of Paying Living Expenses: Not hard at all  Food Insecurity: Unknown (06/26/2023)   Hunger Vital Sign    Worried About Running Out of Food in the Last Year: Never true    Ran Out of Food in the Last Year: Patient declined  Transportation Needs: No Transportation Needs (06/26/2023)   PRAPARE - Administrator, Civil Service (Medical): No    Lack of Transportation (Non-Medical): No  Physical Activity: Insufficiently Active (03/01/2023)   Exercise Vital Sign    Days of Exercise per Week: 2 days    Minutes of Exercise per Session: 20 min  Stress:  No Stress Concern Present (03/01/2023)   [employer] of Occupational Health - Occupational Stress Questionnaire    Feeling of Stress : Only a little  Social Connections: Socially Integrated (03/01/2023)   Social Connection and Isolation Panel [NHANES]    Frequency of Communication with Friends and Family: More than three times a week    Frequency of Social Gatherings with Friends and Family: Three times a week    Attends Religious Services: More than 4 times per year    Active Member of Clubs or Organizations: Yes    Attends Banker Meetings: 1 to 4 times per year    Marital Status: Married     Review of Systems  Constitutional:  Negative for appetite  change and fever.  HENT:  Negative for congestion and sinus pressure.   Respiratory:  Negative for cough, chest tightness and shortness of breath.   Cardiovascular:  Positive for leg swelling. Negative for chest pain and palpitations.  Gastrointestinal:  Negative for abdominal pain, diarrhea, nausea and vomiting.  Genitourinary:  Negative for difficulty urinating and dysuria.  Musculoskeletal:  Negative for joint swelling and myalgias.  Skin:  Negative for rash.       Decreased erythema - lower extremity.   Neurological:  Negative for dizziness and headaches.  Psychiatric/Behavioral:  Negative for agitation and dysphoric mood.        Objective:     BP 124/72   Pulse 86   Temp 97.7 F (36.5 C)   Ht 5' (1.524 m)   Wt 159 lb 9.6 oz (72.4 kg)   LMP 02/13/2013   SpO2 96%   BMI 31.17 kg/m  Wt Readings from Last 3 Encounters:  07/18/23 159 lb 9.6 oz (72.4 kg)  07/04/23 154 lb 12.8 oz (70.2 kg)  06/26/23 156 lb 15.5 oz (71.2 kg)    Physical Exam Vitals reviewed.  Constitutional:      General: She is not in acute distress.    Appearance: Normal appearance.  HENT:     Head: Normocephalic and atraumatic.     Right Ear: External ear normal.     Left Ear: External ear normal.  Eyes:     General: No scleral icterus.       Right eye: No discharge.        Left eye: No discharge.     Conjunctiva/sclera: Conjunctivae normal.  Neck:     Thyroid: No thyromegaly.  Cardiovascular:     Rate and Rhythm: Normal rate and regular rhythm.  Pulmonary:     Effort: No respiratory distress.     Breath sounds: Normal breath sounds. No wheezing.  Abdominal:     General: Bowel sounds are normal.     Palpations: Abdomen is soft.     Tenderness: There is no abdominal tenderness.  Musculoskeletal:     Cervical back: Neck supple. No tenderness.     Comments: Pedal and lower extremity swelling - left. No increased erythema. No cellulitis.   Lymphadenopathy:     Cervical: No cervical adenopathy.   Skin:    Findings: No erythema or rash.  Neurological:     Mental Status: She is alert.  Psychiatric:        Mood and Affect: Mood normal.        Behavior: Behavior normal.      Outpatient Encounter Medications as of 07/18/2023  Medication Sig   amLODipine (NORVASC) 5 MG tablet TAKE 1 TABLET BY MOUTH TWICE A DAY   aspirin 81 MG  tablet Take 81 mg by mouth daily.   carvedilol (COREG) 12.5 MG tablet TAKE 1 TABLET (12.5MG  TOTAL) BY MOUTH TWICE A DAY WITH MEALS   ferrous sulfate 325 (65 FE) MG tablet Take 325 mg by mouth daily with breakfast.   Lancets (ONETOUCH ULTRASOFT) lancets Use as instructed to check Blood sugars 3-4 times a week, twice a day.  Dispense One Touch brand. E11.9   losartan (COZAAR) 100 MG tablet TAKE 1 TABLET BY MOUTH EVERY DAY   Magnesium 250 MG TABS Take 250 mg by mouth at bedtime.   ondansetron (ZOFRAN) 4 MG tablet Take 1 tablet (4 mg total) by mouth every 8 (eight) hours as needed for nausea or vomiting.   ONETOUCH VERIO test strip CHECK BLOOD SUGAR TWICE A DAY 3 TO 4 TIMES A WEEK   pantoprazole (PROTONIX) 40 MG tablet TAKE 1 TABLET (40 MG TOTAL) BY MOUTH DAILY TAKE 30 MINUTES BEFORE BREAKFAST   rosuvastatin (CRESTOR) 40 MG tablet TAKE 1 TABLET BY MOUTH EVERY DAY   solifenacin (VESICARE) 5 MG tablet TAKE 1 TABLET (5 MG TOTAL) BY MOUTH DAILY.   vitamin B-12 (CYANOCOBALAMIN) 1000 MCG tablet Take 1,000 mcg by mouth daily.   Vitamin D3 (VITAMIN D) 25 MCG tablet Take 1,000 Units by mouth daily.   No facility-administered encounter medications on file as of 07/18/2023.     Lab Results  Component Value Date   WBC 3.9 (L) 06/30/2023   HGB 12.8 06/30/2023   HCT 38.5 06/30/2023   PLT 390 06/30/2023   GLUCOSE 134 (H) 06/30/2023   CHOL 171 03/09/2023   TRIG 214.0 (H) 03/09/2023   HDL 48.70 03/09/2023   LDLDIRECT 83.0 03/09/2023   LDLCALC 97 06/01/2022   ALT 20 03/09/2023   AST 16 03/09/2023   NA 135 06/30/2023   K 4.0 06/30/2023   CL 101 06/30/2023    CREATININE 0.68 06/30/2023   BUN 17 06/30/2023   CO2 26 06/30/2023   TSH 3.13 06/01/2022   INR 1.0 11/02/2020   HGBA1C 6.3 03/09/2023   MICROALBUR <0.7 07/26/2022    US Venous Img Lower Unilateral Left (DVT)  Result Date: 06/27/2023 CLINICAL DATA:  [id number] Edema of left lower leg [id number] Pain of left calf [id number] EXAM: LEFT LOWER EXTREMITY VENOUS DOPPLER ULTRASOUND TECHNIQUE: Gray-scale sonography with compression, as well as color and duplex ultrasound, were performed to evaluate the deep venous system(s) from the level of the common femoral vein through the popliteal and proximal calf veins. COMPARISON:  06/14/2023 FINDINGS: VENOUS Normal compressibility of the common femoral, superficial femoral, and popliteal veins, as well as the visualized calf veins. Visualized portions of profunda femoral vein and great saphenous vein unremarkable. No filling defects to suggest DVT on grayscale or color Doppler imaging. Doppler waveforms show normal direction of venous flow, normal respiratory plasticity and response to augmentation. Limited views of the contralateral common femoral vein are unremarkable. OTHER None. Limitations: none IMPRESSION: 1. No evidence of deep venous thrombosis within the left lower extremity. Electronically Signed   By: Sharlet Salina M.D.   On: 06/27/2023 15:47       Assessment & Plan:  Lymphedema Assessment & Plan: Using lymphedema pump daily to help with swelling.  F/u with [facility] next week. Swelling has improved some. Still with increased swelling.  Continue leg elevation and compression hose.    Cellulitis of left lower extremity Assessment & Plan: No evidence of cellulitis.  Remain off abx.  Heel lesion improved.  Follow.    Type  2 diabetes mellitus with hyperglycemia, without long-term current use of insulin (HCC) Assessment & Plan: Diabetic. Low carb diet.  Follow met b and A1c.   Lab Results  Component Value Date   HGBA1C 6.3 03/09/2023      Essential  hypertension, benign Assessment & Plan: Continues on Coreg, losartan and amlodipine. On coreg 12.5mg  bid. Blood pressures as outlined.       Dale [place], MD

## 2023-07-21 ENCOUNTER — Encounter: Payer: Self-pay | Admitting: Internal Medicine

## 2023-07-21 NOTE — Assessment & Plan Note (Signed)
 Continues on Coreg, losartan and amlodipine. On coreg 12.5mg  bid. Blood pressures as outlined.

## 2023-07-21 NOTE — Assessment & Plan Note (Signed)
No evidence of cellulitis.  Remain off abx.  Heel lesion improved.  Follow.

## 2023-07-21 NOTE — Assessment & Plan Note (Signed)
Using lymphedema pump daily to help with swelling.  F/u with [facility] next week. Swelling has improved some. Still with increased swelling.  Continue leg elevation and compression hose.

## 2023-07-21 NOTE — Assessment & Plan Note (Signed)
 Diabetic. Low carb diet.  Follow met b and A1c.   Lab Results  Component Value Date   HGBA1C 6.3 03/09/2023

## 2023-07-27 ENCOUNTER — Encounter (INDEPENDENT_AMBULATORY_CARE_PROVIDER_SITE_OTHER): Payer: Self-pay | Admitting: Vascular Surgery

## 2023-07-27 ENCOUNTER — Encounter: Payer: Self-pay | Admitting: Internal Medicine

## 2023-07-27 NOTE — Telephone Encounter (Signed)
Noted. Please call her and confirm her leg is ok. Confirm no change or worsening?

## 2023-07-28 NOTE — Telephone Encounter (Signed)
Patient confirmed that leg is not doing worse. Doing ok.

## 2023-07-31 ENCOUNTER — Encounter (INDEPENDENT_AMBULATORY_CARE_PROVIDER_SITE_OTHER): Payer: Self-pay | Admitting: Vascular Surgery

## 2023-07-31 ENCOUNTER — Ambulatory Visit (INDEPENDENT_AMBULATORY_CARE_PROVIDER_SITE_OTHER): Payer: BC Managed Care – PPO | Admitting: Vascular Surgery

## 2023-07-31 VITALS — BP 122/81 | HR 83 | Resp 18 | Ht 60.0 in | Wt 158.2 lb

## 2023-07-31 DIAGNOSIS — I10 Essential (primary) hypertension: Secondary | ICD-10-CM | POA: Diagnosis not present

## 2023-07-31 DIAGNOSIS — E7800 Pure hypercholesterolemia, unspecified: Secondary | ICD-10-CM | POA: Diagnosis not present

## 2023-07-31 DIAGNOSIS — I890 Lymphedema, not elsewhere classified: Secondary | ICD-10-CM | POA: Diagnosis not present

## 2023-07-31 DIAGNOSIS — E1165 Type 2 diabetes mellitus with hyperglycemia: Secondary | ICD-10-CM

## 2023-07-31 NOTE — Progress Notes (Signed)
MRN : [id number]  Deborah Bartlett is a 62 y.o. (12-22-1960) female who presents with chief complaint of legs swell.  History of Present Illness:  The patient presents to the office for followup evaluation regarding leg swelling.  Swelling is much more present on the left side compared to the right.  She recently was admitted to the hospital secondary to cellulitis and has had a significant trouble with clearing the cellulitis.  However she does not use the pump on a routine basis at this time.  The pain associated with swelling is decreased. There have not been any interval development of a ulcerations or wounds.  The patient denies problems with the pump, noting it is working well and the leggings are in good condition.  However, she notes that she has not been using it on a routine basis  Since the previous visit the patient has been wearing graduated compression stockings and using the lymph pump on a routine basis and  has noted significant improvement in the lymphedema.     No outpatient medications have been marked as taking for the 07/31/23 encounter (Appointment) with Gilda Crease, Latina Craver, MD.    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   CESAREAN SECTION  1990, 1993, 1999   COLONOSCOPY  2014   Dr Lemar Livings   ORIF ELBOW FRACTURE Left 05/26/2020   Procedure: OPEN REDUCTION INTERNAL FIXATION (ORIF) ELBOW/OLECRANON FRACTURE;  Surgeon: Christena Flake, MD;  Location: [facility];  Service: Orthopedics;  Laterality: Left;   SHOULDER ARTHROSCOPY WITH ROTATOR CUFF REPAIR Right 02/21/2018   Procedure: SHOULDER ARTHROSCOPY WITH  DEBRIDEMENT DECOMPRESSION AND REPAIR OF ROTATOR CUFF TEAR;  Surgeon: Christena Flake, MD;  Location: [facility];  Service: Orthopedics;  Laterality: Right;  Diabetic - diet controlled    Social History Social History   Tobacco Use   Smoking status: Never   Smokeless tobacco: Never  Vaping Use   Vaping status: Never Used  Substance Use Topics   Alcohol use: No    Alcohol/week: 0.0 standard drinks of alcohol   Drug use: No    Family History Family History  Problem Relation Age of Onset   Hypertension Mother    Diabetes Mother    Diabetes Maternal Grandmother    Diabetes Maternal Grandfather    Breast cancer Neg Hx    Colon cancer Neg Hx     Allergies  Allergen Reactions   Codeine Palpitations    dizzy     REVIEW OF SYSTEMS (Negative unless checked)  Constitutional: [] Weight loss  [] Fever  [] Chills Cardiac: [] Chest pain   [] Chest pressure   [] Palpitations   [] Shortness of breath when laying flat   [] Shortness of breath with exertion. Vascular:  [] Pain in legs with walking   [x] Pain in legs with standing  [] History of DVT   [] Phlebitis   [x] Swelling in legs   [] Varicose veins   [] Non-healing ulcers Pulmonary:   [] Uses home oxygen   [] Productive cough   []   Hemoptysis   [] Wheeze  [] COPD   [] Asthma Neurologic:  [] Dizziness   [] Seizures   [] History of stroke   [] History of TIA  [] Aphasia   [] Vissual changes   [] Weakness or numbness in arm   [x] Weakness or numbness in leg Musculoskeletal:   [] Joint swelling   [x] Joint pain   [x] Low back pain Hematologic:  [] Easy bruising  [] Easy bleeding   [] Hypercoagulable state   [] Anemic Gastrointestinal:  [] Diarrhea   [] Vomiting  [x] Gastroesophageal reflux/heartburn   [] Difficulty swallowing. Genitourinary:  [] Chronic kidney disease   [] Difficult urination  [] Frequent urination   [] Blood in urine Skin:  [] Rashes   [] Ulcers  Psychological:  [] History of anxiety   []  History of major depression.  Physical Examination  There were no  vitals filed for this visit. There is no height or weight on file to calculate BMI. Gen: WD/WN, NAD Head: [place]/AT, No temporalis wasting.  Ear/Nose/Throat: Hearing grossly intact, nares w/o erythema or drainage, pinna without lesions Eyes: PER, EOMI, sclera nonicteric.  Neck: Supple, no gross masses.  No JVD.  Pulmonary:  Good air movement, no audible wheezing, no use of accessory muscles.  Cardiac: RRR, precordium not hyperdynamic. Vascular:  scattered varicosities present bilaterally.  Mild venous stasis changes to the legs bilaterally.  3-4+ soft pitting edema, CEAP [id number]  Vessel Right Left  Radial Palpable Palpable  Gastrointestinal: soft, non-distended. No guarding/no peritoneal signs.  Musculoskeletal: M/S 5/5 throughout.  No deformity.  Neurologic: CN 2-12 intact. Pain and light touch intact in extremities.  Symmetrical.  Speech is fluent. Motor exam as listed above. Psychiatric: Judgment intact, Mood & affect appropriate for pt's clinical situation. Dermatologic: Venous rashes no ulcers noted.  No changes consistent with cellulitis. Lymph : No lichenification or skin changes of chronic lymphedema.  CBC Lab Results  Component Value Date   WBC 3.9 (L) 06/30/2023   HGB 12.8 06/30/2023   HCT 38.5 06/30/2023   MCV 88.3 06/30/2023   PLT 390 06/30/2023    BMET    Component Value Date/Time   NA 135 06/30/2023 1711   NA 141 10/31/2018 0849   NA 140 12/05/2014 0736   K 4.0 06/30/2023 1711   K 3.8 12/06/2014 1053   CL 101 06/30/2023 1711   CL 104 12/05/2014 0736   CO2 26 06/30/2023 1711   CO2 28 12/05/2014 0736   GLUCOSE 134 (H) 06/30/2023 1711   GLUCOSE 121 (H) 12/05/2014 0736   BUN 17 06/30/2023 1711   BUN 17 10/31/2018 0849   BUN 15 12/05/2014 0736   CREATININE 0.68 06/30/2023 1711   CREATININE 0.81 12/06/2014 1053   CALCIUM 10.5 (H) 06/30/2023 1711   CALCIUM 8.9 12/05/2014 0736   GFRNONAA >60 06/30/2023 1711   GFRNONAA >60 12/06/2014 1053   GFRAA 76 10/31/2018  0849   GFRAA >60 12/06/2014 1053   CrCl cannot be calculated (Patient's most recent lab result is older than the maximum 21 days allowed.).  COAG Lab Results  Component Value Date   INR 1.0 11/02/2020    Radiology No results found.   Assessment/Plan 1. Lymphedema Recommend:  No surgery or intervention at this point in time.    I have reviewed my discussion with the patient regarding lymphedema and why it  causes symptoms.  Patient will continue wearing graduated compression on a daily basis. The patient should put the compression on first thing in the morning and removing them in the evening. The patient should not sleep in the compression.   In addition, behavioral modification  throughout the day will be continued.  This will include frequent elevation (such as in a recliner), use of over the counter pain medications as needed and exercise such as walking.  The systemic causes for chronic edema such as liver, kidney and cardiac etiologies does not appear to have significant changed over the past year.    The patient will continue aggressive use of the  lymph pump.  This will continue to improve the edema control and prevent sequela such as ulcers and infections.   The patient will follow-up with me on an annual basis.   2. Essential hypertension, benign Continue antihypertensive medications as already ordered, these medications have been reviewed and there are no changes at this time.  3. Type 2 diabetes mellitus with hyperglycemia, without long-term current use of insulin (HCC) Continue hypoglycemic medications as already ordered, these medications have been reviewed and there are no changes at this time.  Hgb A1C to be monitored as already arranged by primary service  4. Hypercholesterolemia Continue statin as ordered and reviewed, no changes at this time    Levora Dredge, MD  07/31/2023 12:57 PM

## 2023-08-02 ENCOUNTER — Encounter: Payer: Self-pay | Admitting: Internal Medicine

## 2023-08-02 NOTE — Telephone Encounter (Signed)
See if agreeable for appt - can put her in the 10:30 spot Thursday 08/03/23.

## 2023-08-03 ENCOUNTER — Ambulatory Visit (INDEPENDENT_AMBULATORY_CARE_PROVIDER_SITE_OTHER): Payer: BC Managed Care – PPO | Admitting: Internal Medicine

## 2023-08-03 VITALS — BP 124/72 | HR 70 | Temp 98.1°F | Ht 60.0 in | Wt 157.4 lb

## 2023-08-03 DIAGNOSIS — Z1211 Encounter for screening for malignant neoplasm of colon: Secondary | ICD-10-CM

## 2023-08-03 DIAGNOSIS — E1165 Type 2 diabetes mellitus with hyperglycemia: Secondary | ICD-10-CM

## 2023-08-03 DIAGNOSIS — I10 Essential (primary) hypertension: Secondary | ICD-10-CM

## 2023-08-03 DIAGNOSIS — I890 Lymphedema, not elsewhere classified: Secondary | ICD-10-CM | POA: Diagnosis not present

## 2023-08-03 DIAGNOSIS — M7989 Other specified soft tissue disorders: Secondary | ICD-10-CM

## 2023-08-03 LAB — MICROALBUMIN / CREATININE URINE RATIO
Creatinine,U: 275.9 mg/dL
Microalb Creat Ratio: 6.3 mg/g (ref 0.0–30.0)
Microalb, Ur: 17.4 mg/dL — ABNORMAL HIGH (ref 0.0–1.9)

## 2023-08-03 NOTE — Telephone Encounter (Signed)
Pt called and can be here for 10:30a for an appointment with Dr. Lorin Picket.

## 2023-08-06 ENCOUNTER — Encounter: Payer: Self-pay | Admitting: Internal Medicine

## 2023-08-06 NOTE — Assessment & Plan Note (Signed)
Using lymphedema pump daily to help with swelling.  Evaluated by [facility] as outlined. Swelling has improved. Continue leg elevation and compression hose.

## 2023-08-06 NOTE — Assessment & Plan Note (Signed)
 Continues on Coreg, losartan and amlodipine. On coreg 12.5mg  bid. Blood pressures as outlined.

## 2023-08-06 NOTE — Assessment & Plan Note (Signed)
 Diabetic. Low carb diet.  Follow met b and A1c.   Lab Results  Component Value Date   HGBA1C 6.3 03/09/2023

## 2023-08-06 NOTE — Progress Notes (Signed)
Subjective:    Patient ID: [name], female    DOB: 27-Apr-1961, 62 y.o.   MRN: [id number]  Patient here for  Chief Complaint  Patient presents with   Follow-up    HPI Here for a work in appt. Work in - f/u lower extremity swelling and recent [facility] appt. Recently saw [facility].  Recommended to continue lymphedema pump. Compression hose - new pair from [employer]. These hose fit better. Swelling has improved. Still having to limit her time up on her feet. Heel lesions improved. Still with some pain, but overall improved. Discussed work. She will remain out of work until after first of the year. Discussed when returning, amy need to limit continuous time on her feet - with breaks. 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   CESAREAN SECTION  1990, 1993, 1999   COLONOSCOPY  2014   Dr Lemar Livings   ORIF ELBOW FRACTURE Left 05/26/2020   Procedure: OPEN REDUCTION INTERNAL FIXATION (ORIF) ELBOW/OLECRANON FRACTURE;  Surgeon: Christena Flake, MD;  Location: [facility];  Service: Orthopedics;  Laterality: Left;   SHOULDER ARTHROSCOPY WITH ROTATOR CUFF REPAIR Right 02/21/2018   Procedure: SHOULDER ARTHROSCOPY WITH DEBRIDEMENT DECOMPRESSION AND REPAIR OF ROTATOR CUFF TEAR;  Surgeon: Christena Flake, MD;  Location: [facility];  Service: Orthopedics;  Laterality: Right;  Diabetic - diet controlled   Family History  Problem Relation Age of Onset   Hypertension Mother    Diabetes Mother    Diabetes Maternal Grandmother    Diabetes Maternal Grandfather     Breast cancer Neg Hx    Colon cancer Neg Hx    Social History   Socioeconomic History   Marital status: Married    Spouse name: Not on file   Number of children: Not on file   Years of education: Not on file   Highest education level: 12th grade  Occupational History   Not on file  Tobacco Use   Smoking status: Never   Smokeless tobacco: Never  Vaping Use   Vaping status: Never Used  Substance and Sexual Activity   Alcohol use: No    Alcohol/week: 0.0 standard drinks of alcohol   Drug use: No   Sexual activity: Not on file  Other Topics Concern   Not on file  Social History Narrative   Not on file   Social Drivers of Health   Financial Resource Strain: Low Risk  (03/01/2023)   Overall Financial Resource Strain (CARDIA)    Difficulty of Paying Living Expenses: Not hard at all  Food Insecurity: Unknown (06/26/2023)   Hunger Vital Sign    Worried About Running Out of Food in the Last Year: Never true    Ran Out of Food in the Last Year: Patient declined  Transportation Needs: No Transportation Needs (06/26/2023)   PRAPARE - Administrator, Civil Service (Medical): No    Lack of Transportation (Non-Medical): No  Physical Activity: Insufficiently Active (03/01/2023)   Exercise Vital Sign    Days  of Exercise per Week: 2 days    Minutes of Exercise per Session: 20 min  Stress: No Stress Concern Present (03/01/2023)   [employer] of Occupational Health - Occupational Stress Questionnaire    Feeling of Stress : Only a little  Social Connections: Socially Integrated (03/01/2023)   Social Connection and Isolation Panel [NHANES]    Frequency of Communication with Friends and Family: More than three times a week    Frequency of Social Gatherings with Friends and Family: Three times a week    Attends Religious Services: More than 4 times per year    Active Member of Clubs or Organizations: Yes    Attends Banker Meetings: 1 to 4 times per year     Marital Status: Married     Review of Systems  Constitutional:  Negative for appetite change and unexpected weight change.  HENT:  Negative for congestion and sinus pressure.   Respiratory:  Negative for cough, chest tightness and shortness of breath.   Cardiovascular:  Negative for chest pain and palpitations.       Leg swelling improved.   Gastrointestinal:  Negative for abdominal pain, diarrhea, nausea and vomiting.  Genitourinary:  Negative for difficulty urinating and dysuria.  Musculoskeletal:  Negative for joint swelling and myalgias.  Skin:        Decreased erythema - lower leg. Heel lesions - improving.   Neurological:  Negative for dizziness and headaches.  Psychiatric/Behavioral:  Negative for agitation and dysphoric mood.        Objective:     BP 124/72   Pulse 70   Temp 98.1 F (36.7 C)   Ht 5' (1.524 m)   Wt 157 lb 6.4 oz (71.4 kg)   LMP 02/13/2013   SpO2 96%   BMI 30.74 kg/m  Wt Readings from Last 3 Encounters:  08/03/23 157 lb 6.4 oz (71.4 kg)  07/31/23 158 lb 3.2 oz (71.8 kg)  07/18/23 159 lb 9.6 oz (72.4 kg)    Physical Exam Vitals reviewed.  Constitutional:      General: She is not in acute distress.    Appearance: Normal appearance.  HENT:     Head: Normocephalic and atraumatic.     Right Ear: External ear normal.     Left Ear: External ear normal.     Mouth/Throat:     Pharynx: No oropharyngeal exudate or posterior oropharyngeal erythema.  Eyes:     General: No scleral icterus.       Right eye: No discharge.        Left eye: No discharge.     Conjunctiva/sclera: Conjunctivae normal.  Neck:     Thyroid: No thyromegaly.  Cardiovascular:     Rate and Rhythm: Normal rate and regular rhythm.  Pulmonary:     Effort: No respiratory distress.     Breath sounds: Normal breath sounds. No wheezing.  Abdominal:     General: Bowel sounds are normal.     Palpations: Abdomen is soft.     Tenderness: There is no abdominal tenderness.   Musculoskeletal:     Cervical back: Neck supple. No tenderness.     Comments: Decreased pedal and lower extremity swelling. Still pedal/lower extremity swelling - much improved. Decreased erythema.   Lymphadenopathy:     Cervical: No cervical adenopathy.  Neurological:     Mental Status: She is alert.  Psychiatric:        Mood and Affect: Mood normal.  Behavior: Behavior normal.      Outpatient Encounter Medications as of 08/03/2023  Medication Sig   amLODipine (NORVASC) 5 MG tablet TAKE 1 TABLET BY MOUTH TWICE A DAY   aspirin 81 MG tablet Take 81 mg by mouth daily.   carvedilol (COREG) 12.5 MG tablet TAKE 1 TABLET (12.5MG  TOTAL) BY MOUTH TWICE A DAY WITH MEALS   ferrous sulfate 325 (65 FE) MG tablet Take 325 mg by mouth daily with breakfast.   Lancets (ONETOUCH ULTRASOFT) lancets Use as instructed to check Blood sugars 3-4 times a week, twice a day.  Dispense One Touch brand. E11.9   losartan (COZAAR) 100 MG tablet TAKE 1 TABLET BY MOUTH EVERY DAY   Magnesium 250 MG TABS Take 250 mg by mouth at bedtime.   ondansetron (ZOFRAN) 4 MG tablet Take 1 tablet (4 mg total) by mouth every 8 (eight) hours as needed for nausea or vomiting.   ONETOUCH VERIO test strip CHECK BLOOD SUGAR TWICE A DAY 3 TO 4 TIMES A WEEK   pantoprazole (PROTONIX) 40 MG tablet TAKE 1 TABLET (40 MG TOTAL) BY MOUTH DAILY TAKE 30 MINUTES BEFORE BREAKFAST   rosuvastatin (CRESTOR) 40 MG tablet TAKE 1 TABLET BY MOUTH EVERY DAY   solifenacin (VESICARE) 5 MG tablet TAKE 1 TABLET (5 MG TOTAL) BY MOUTH DAILY.   vitamin B-12 (CYANOCOBALAMIN) 1000 MCG tablet Take 1,000 mcg by mouth daily.   Vitamin D3 (VITAMIN D) 25 MCG tablet Take 1,000 Units by mouth daily.   No facility-administered encounter medications on file as of 08/03/2023.     Lab Results  Component Value Date   WBC 3.9 (L) 06/30/2023   HGB 12.8 06/30/2023   HCT 38.5 06/30/2023   PLT 390 06/30/2023   GLUCOSE 134 (H) 06/30/2023   CHOL 171 03/09/2023    TRIG 214.0 (H) 03/09/2023   HDL 48.70 03/09/2023   LDLDIRECT 83.0 03/09/2023   LDLCALC 97 06/01/2022   ALT 20 03/09/2023   AST 16 03/09/2023   NA 135 06/30/2023   K 4.0 06/30/2023   CL 101 06/30/2023   CREATININE 0.68 06/30/2023   BUN 17 06/30/2023   CO2 26 06/30/2023   TSH 3.13 06/01/2022   INR 1.0 11/02/2020   HGBA1C 6.3 03/09/2023   MICROALBUR 17.4 (H) 08/03/2023    US Venous Img Lower Unilateral Left (DVT) Result Date: 06/27/2023 CLINICAL DATA:  [id number] Edema of left lower leg [id number] Pain of left calf [id number] EXAM: LEFT LOWER EXTREMITY VENOUS DOPPLER ULTRASOUND TECHNIQUE: Gray-scale sonography with compression, as well as color and duplex ultrasound, were performed to evaluate the deep venous system(s) from the level of the common femoral vein through the popliteal and proximal calf veins. COMPARISON:  06/14/2023 FINDINGS: VENOUS Normal compressibility of the common femoral, superficial femoral, and popliteal veins, as well as the visualized calf veins. Visualized portions of profunda femoral vein and great saphenous vein unremarkable. No filling defects to suggest DVT on grayscale or color Doppler imaging. Doppler waveforms show normal direction of venous flow, normal respiratory plasticity and response to augmentation. Limited views of the contralateral common femoral vein are unremarkable. OTHER None. Limitations: none IMPRESSION: 1. No evidence of deep venous thrombosis within the left lower extremity. Electronically Signed   By: Sharlet Salina M.D.   On: 06/27/2023 15:47       Assessment & Plan:  Type 2 diabetes mellitus with hyperglycemia, without long-term current use of insulin (HCC) Assessment & Plan: Diabetic. Low carb diet.  Follow met b and  A1c.   Lab Results  Component Value Date   HGBA1C 6.3 03/09/2023     Orders: -     Microalbumin / creatinine urine ratio  Encounter for screening colonoscopy -     Ambulatory referral to  Gastroenterology  Lymphedema Assessment & Plan: Using lymphedema pump daily to help with swelling.  Evaluated by [facility] as outlined. Swelling has improved. Continue leg elevation and compression hose.    Essential hypertension, benign Assessment & Plan: Continues on Coreg, losartan and amlodipine. On coreg 12.5mg  bid. Blood pressures as outlined.    Swelling of left lower extremity Assessment & Plan: Using lymphedema pump daily to help with swelling.  Evaluated by [facility] as outlined. Swelling has improved. Continue leg elevation and compression hose.       Dale McFarland, MD

## 2023-08-07 ENCOUNTER — Telehealth: Payer: Self-pay

## 2023-08-07 ENCOUNTER — Other Ambulatory Visit: Payer: Self-pay

## 2023-08-07 DIAGNOSIS — Z1211 Encounter for screening for malignant neoplasm of colon: Secondary | ICD-10-CM

## 2023-08-07 MED ORDER — NA SULFATE-K SULFATE-MG SULF 17.5-3.13-1.6 GM/177ML PO SOLN: 1.0000 | Freq: Once | ORAL | 0 refills | Status: AC

## 2023-08-07 NOTE — Telephone Encounter (Signed)
Gastroenterology Pre-Procedure Review  Request Date: 09/07/23 Requesting Physician: Dr. Allegra Lai  PATIENT REVIEW QUESTIONS: The patient responded to the following health history questions as indicated:    1. Are you having any GI issues? no 2. Do you have a personal history of Polyps? no 3. Do you have a family history of Colon Cancer or Polyps? no 4. Diabetes Mellitus? no 5. Joint replacements in the past 12 months?no 6. Major health problems in the past 3 months?no 7. Any artificial heart valves, MVP, or defibrillator?no    MEDICATIONS & ALLERGIES:    Patient reports the following regarding taking any anticoagulation/antiplatelet therapy:   Plavix, Coumadin, Eliquis, Xarelto, Lovenox, Pradaxa, Brilinta, or Effient? no Aspirin? Yes 81 mg daily  Patient confirms/reports the following medications:  Current Outpatient Medications  Medication Sig Dispense Refill   amLODipine (NORVASC) 5 MG tablet TAKE 1 TABLET BY MOUTH TWICE A DAY 180 tablet 3   aspirin 81 MG tablet Take 81 mg by mouth daily.     carvedilol (COREG) 12.5 MG tablet TAKE 1 TABLET (12.5MG  TOTAL) BY MOUTH TWICE A DAY WITH MEALS 180 tablet 1   ferrous sulfate 325 (65 FE) MG tablet Take 325 mg by mouth daily with breakfast.     Lancets (ONETOUCH ULTRASOFT) lancets Use as instructed to check Blood sugars 3-4 times a week, twice a day.  Dispense One Touch brand. E11.9 100 each 12   losartan (COZAAR) 100 MG tablet TAKE 1 TABLET BY MOUTH EVERY DAY 90 tablet 1   Magnesium 250 MG TABS Take 250 mg by mouth at bedtime.     ondansetron (ZOFRAN) 4 MG tablet Take 1 tablet (4 mg total) by mouth every 8 (eight) hours as needed for nausea or vomiting. 15 tablet 0   ONETOUCH VERIO test strip CHECK BLOOD SUGAR TWICE A DAY 3 TO 4 TIMES A WEEK 100 strip 12   pantoprazole (PROTONIX) 40 MG tablet TAKE 1 TABLET (40 MG TOTAL) BY MOUTH DAILY TAKE 30 MINUTES BEFORE BREAKFAST 90 tablet 1   rosuvastatin (CRESTOR) 40 MG tablet TAKE 1 TABLET BY MOUTH EVERY  DAY 90 tablet 3   solifenacin (VESICARE) 5 MG tablet TAKE 1 TABLET (5 MG TOTAL) BY MOUTH DAILY. 90 tablet 1   vitamin B-12 (CYANOCOBALAMIN) 1000 MCG tablet Take 1,000 mcg by mouth daily.     Vitamin D3 (VITAMIN D) 25 MCG tablet Take 1,000 Units by mouth daily.     No current facility-administered medications for this visit.    Patient confirms/reports the following allergies:  Allergies  Allergen Reactions   Codeine Palpitations    dizzy    No orders of the defined types were placed in this encounter.   AUTHORIZATION INFORMATION Primary Insurance: 1D#: Group #:  Secondary Insurance: 1D#: Group #:  SCHEDULE INFORMATION: Date: 09/07/23 Time: Location: [facility]

## 2023-08-10 ENCOUNTER — Encounter: Payer: Self-pay | Admitting: Internal Medicine

## 2023-08-11 ENCOUNTER — Ambulatory Visit (INDEPENDENT_AMBULATORY_CARE_PROVIDER_SITE_OTHER): Payer: BC Managed Care – PPO | Admitting: Nurse Practitioner

## 2023-08-11 ENCOUNTER — Encounter: Payer: Self-pay | Admitting: Nurse Practitioner

## 2023-08-11 VITALS — BP 120/76 | HR 86 | Temp 98.2°F | Ht 60.0 in | Wt 158.6 lb

## 2023-08-11 DIAGNOSIS — J069 Acute upper respiratory infection, unspecified: Secondary | ICD-10-CM | POA: Diagnosis not present

## 2023-08-11 MED ORDER — GUAIFENESIN ER 600 MG PO TB12
600.0000 mg | ORAL_TABLET | Freq: Two times a day (BID) | ORAL | 0 refills | Status: DC
Start: 2023-08-11 — End: 2024-07-09

## 2023-08-11 MED ORDER — AMOXICILLIN-POT CLAVULANATE 875-125 MG PO TABS: 1.0000 | ORAL_TABLET | Freq: Two times a day (BID) | ORAL | 0 refills | Status: AC

## 2023-08-11 MED ORDER — FEXOFENADINE HCL 60 MG PO TABS
60.0000 mg | ORAL_TABLET | Freq: Two times a day (BID) | ORAL | 0 refills | Status: AC
Start: 2023-08-11 — End: ?

## 2023-08-11 NOTE — Telephone Encounter (Signed)
Patient scheduled with Deborah Bartlett for evaluation

## 2023-08-11 NOTE — Patient Instructions (Signed)
Increase fluid intake, warm tea with honey. Rx sent to pharmacy.

## 2023-08-11 NOTE — Progress Notes (Signed)
Established Patient Office Visit  Subjective:  Patient ID: AMALIE ASAY, female    DOB: 26-Dec-1960  Age: 62 y.o. MRN: [id number]  CC:  Chief Complaint  Patient presents with   Cough    HPI  COREENA TASSY presents for:  Cough This is a new problem. The current episode started in the past 7 days. The problem has been gradually worsening. The cough is Non-productive. Associated symptoms include postnasal drip. Pertinent negatives include no ear congestion, sore throat, shortness of breath or wheezing. Associated symptoms comments: Chest congestion.   Zyrtec.   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   CESAREAN SECTION  1990, 1993, 1999   COLONOSCOPY  2014   Dr Lemar Livings   ORIF ELBOW FRACTURE Left 05/26/2020   Procedure: OPEN REDUCTION INTERNAL FIXATION (ORIF) ELBOW/OLECRANON FRACTURE;  Surgeon: Christena Flake, MD;  Location: [facility];  Service: Orthopedics;  Laterality: Left;   SHOULDER ARTHROSCOPY WITH ROTATOR CUFF REPAIR Right 02/21/2018   Procedure: SHOULDER ARTHROSCOPY WITH DEBRIDEMENT DECOMPRESSION AND REPAIR OF ROTATOR CUFF TEAR;  Surgeon: Christena Flake, MD;  Location: [facility];  Service: Orthopedics;  Laterality: Right;  Diabetic - diet controlled    Family History  Problem Relation Age of Onset   Hypertension Mother    Diabetes Mother    Diabetes Maternal Grandmother    Diabetes Maternal Grandfather    Breast cancer Neg Hx    Colon cancer Neg Hx     Social History   Socioeconomic History   Marital status: Married    Spouse name: Not on file   Number of children: Not on file   Years of education: Not on file    Highest education level: 12th grade  Occupational History   Not on file  Tobacco Use   Smoking status: Never   Smokeless tobacco: Never  Vaping Use   Vaping status: Never Used  Substance and Sexual Activity   Alcohol use: No    Alcohol/week: 0.0 standard drinks of alcohol   Drug use: No   Sexual activity: Not on file  Other Topics Concern   Not on file  Social History Narrative   Not on file   Social Drivers of Health   Financial Resource Strain: Low Risk  (03/01/2023)   Overall Financial Resource Strain (CARDIA)    Difficulty of Paying Living Expenses: Not hard at all  Food Insecurity: Unknown (06/26/2023)   Hunger Vital Sign    Worried About Running Out of Food in the Last Year: Never true    Ran Out of Food in the Last Year: Patient declined  Transportation Needs: No Transportation Needs (06/26/2023)   PRAPARE - Administrator, Civil Service (Medical): No    Lack of Transportation (Non-Medical): No  Physical Activity: Insufficiently Active (03/01/2023)   Exercise Vital Sign    Days of Exercise per Week: 2 days    Minutes of Exercise per Session: 20 min  Stress: No Stress Concern Present (03/01/2023)   [employer] of Occupational Health - Occupational Stress Questionnaire    Feeling of Stress : Only a little  Social  Connections: Socially Integrated (03/01/2023)   Social Connection and Isolation Panel [NHANES]    Frequency of Communication with Friends and Family: More than three times a week    Frequency of Social Gatherings with Friends and Family: Three times a week    Attends Religious Services: More than 4 times per year    Active Member of Clubs or Organizations: Yes    Attends Banker Meetings: 1 to 4 times per year    Marital Status: Married  Catering manager Violence: Not At Risk (06/26/2023)   Humiliation, Afraid, Rape, and Kick questionnaire    Fear of Current or Ex-Partner: No    Emotionally Abused: No    Physically Abused: No     Sexually Abused: No     Outpatient Medications Prior to Visit  Medication Sig Dispense Refill   amLODipine (NORVASC) 5 MG tablet TAKE 1 TABLET BY MOUTH TWICE A DAY 180 tablet 3   aspirin 81 MG tablet Take 81 mg by mouth daily.     carvedilol (COREG) 12.5 MG tablet TAKE 1 TABLET (12.5MG  TOTAL) BY MOUTH TWICE A DAY WITH MEALS 180 tablet 1   ferrous sulfate 325 (65 FE) MG tablet Take 325 mg by mouth daily with breakfast.     Lancets (ONETOUCH ULTRASOFT) lancets Use as instructed to check Blood sugars 3-4 times a week, twice a day.  Dispense One Touch brand. E11.9 100 each 12   losartan (COZAAR) 100 MG tablet TAKE 1 TABLET BY MOUTH EVERY DAY 90 tablet 1   Magnesium 250 MG TABS Take 250 mg by mouth at bedtime.     ondansetron (ZOFRAN) 4 MG tablet Take 1 tablet (4 mg total) by mouth every 8 (eight) hours as needed for nausea or vomiting. 15 tablet 0   ONETOUCH VERIO test strip CHECK BLOOD SUGAR TWICE A DAY 3 TO 4 TIMES A WEEK 100 strip 12   pantoprazole (PROTONIX) 40 MG tablet TAKE 1 TABLET (40 MG TOTAL) BY MOUTH DAILY TAKE 30 MINUTES BEFORE BREAKFAST 90 tablet 1   rosuvastatin (CRESTOR) 40 MG tablet TAKE 1 TABLET BY MOUTH EVERY DAY 90 tablet 3   solifenacin (VESICARE) 5 MG tablet TAKE 1 TABLET (5 MG TOTAL) BY MOUTH DAILY. 90 tablet 1   vitamin B-12 (CYANOCOBALAMIN) 1000 MCG tablet Take 1,000 mcg by mouth daily.     Vitamin D3 (VITAMIN D) 25 MCG tablet Take 1,000 Units by mouth daily.     No facility-administered medications prior to visit.    Allergies  Allergen Reactions   Codeine Palpitations    dizzy    ROS Review of Systems  HENT:  Positive for postnasal drip. Negative for sore throat.   Respiratory:  Positive for cough. Negative for shortness of breath and wheezing.    Negative unless indicated in HPI.    Objective:    Physical Exam Constitutional:      Appearance: Normal appearance.  HENT:     Right Ear: Tympanic membrane normal. Tympanic membrane is not erythematous.      Left Ear: Tympanic membrane normal. Tympanic membrane is not erythematous.     Nose:     Right Turbinates: Not enlarged.     Left Turbinates: Not enlarged.     Right Sinus: No maxillary sinus tenderness or frontal sinus tenderness.     Left Sinus: No maxillary sinus tenderness or frontal sinus tenderness.     Mouth/Throat:     Mouth: Mucous membranes are moist.     Pharynx: Postnasal  drip present. No pharyngeal swelling, oropharyngeal exudate or posterior oropharyngeal erythema.     Tonsils: No tonsillar exudate.  Cardiovascular:     Rate and Rhythm: Normal rate and regular rhythm.  Pulmonary:     Effort: Pulmonary effort is normal.     Breath sounds: Normal breath sounds. No stridor. No wheezing.  Neurological:     General: No focal deficit present.     Mental Status: She is alert and oriented to person, place, and time. Mental status is at baseline.  Psychiatric:        Mood and Affect: Mood normal.        Behavior: Behavior normal.        Thought Content: Thought content normal.        Judgment: Judgment normal.     BP 120/76   Pulse 86   Temp 98.2 F (36.8 C) (Oral)   Ht 5' (1.524 m)   Wt 158 lb 9.6 oz (71.9 kg)   LMP 02/13/2013   SpO2 97%   BMI 30.97 kg/m  Wt Readings from Last 3 Encounters:  08/11/23 158 lb 9.6 oz (71.9 kg)  08/03/23 157 lb 6.4 oz (71.4 kg)  07/31/23 158 lb 3.2 oz (71.8 kg)     Health Maintenance  Topic Date Due   FOOT EXAM  Never done   Hepatitis C Screening  Never done   Zoster Vaccines- Shingrix (1 of 2) Never done   OPHTHALMOLOGY EXAM  09/13/2022   COVID-19 Vaccine (4 - 2024-25 season) 04/23/2023   Colonoscopy  05/29/2023   INFLUENZA VACCINE  11/20/2023 (Originally 03/23/2023)   HEMOGLOBIN A1C  09/09/2023   MAMMOGRAM  09/15/2023   Diabetic kidney evaluation - eGFR measurement  06/29/2024   Diabetic kidney evaluation - Urine ACR  08/02/2024   Cervical Cancer Screening (HPV/Pap Cotest)  06/07/2027   DTaP/Tdap/Td (2 - Td or Tdap)  11/03/2030   HIV Screening  Completed   HPV VACCINES  Aged Out    There are no preventive care reminders to display for this patient.  Lab Results  Component Value Date   TSH 3.13 06/01/2022   Lab Results  Component Value Date   WBC 3.9 (L) 06/30/2023   HGB 12.8 06/30/2023   HCT 38.5 06/30/2023   MCV 88.3 06/30/2023   PLT 390 06/30/2023   Lab Results  Component Value Date   NA 135 06/30/2023   K 4.0 06/30/2023   CO2 26 06/30/2023   GLUCOSE 134 (H) 06/30/2023   BUN 17 06/30/2023   CREATININE 0.68 06/30/2023   BILITOT 0.4 03/09/2023   ALKPHOS 117 03/09/2023   AST 16 03/09/2023   ALT 20 03/09/2023   PROT 7.2 03/09/2023   ALBUMIN 4.4 03/09/2023   CALCIUM 10.5 (H) 06/30/2023   ANIONGAP 8 06/30/2023   GFR 67.53 03/09/2023   Lab Results  Component Value Date   CHOL 171 03/09/2023   Lab Results  Component Value Date   HDL 48.70 03/09/2023   Lab Results  Component Value Date   LDLCALC 97 06/01/2022   Lab Results  Component Value Date   TRIG 214.0 (H) 03/09/2023   Lab Results  Component Value Date   CHOLHDL 4 03/09/2023   Lab Results  Component Value Date   HGBA1C 6.3 03/09/2023      Assessment & Plan:  URI with cough and congestion Assessment & Plan: Vital signs stable, patient in is no sign of acute distress, nontoxic.  Will treat with Augmentin.  Increase fluid intake.  Take over-the-counter plain Mucinex and antihistamine.  Use steam and humidifier. Will let us know if symptoms not improving   Orders: -     Amoxicillin-Pot Clavulanate; Take 1 tablet by mouth 2 (two) times daily for 7 days.  Dispense: 14 tablet; Refill: 0 -     guaiFENesin ER; Take 1 tablet (600 mg total) by mouth 2 (two) times daily.  Dispense: 30 tablet; Refill: 0 -     Fexofenadine HCl; Take 1 tablet (60 mg total) by mouth 2 (two) times daily.  Dispense: 30 tablet; Refill: 0    Follow-up: Return if symptoms worsen or fail to improve.   Kara Dies, NP

## 2023-08-18 ENCOUNTER — Telehealth: Payer: Self-pay

## 2023-08-18 ENCOUNTER — Telehealth: Payer: Self-pay | Admitting: Gastroenterology

## 2023-08-18 NOTE — Telephone Encounter (Signed)
Returned phone call to patient.  Left voice message letting her know that I've rescheduled her colonoscopy to 10/20/23.  Instructions updated.  Referral updated. Vikkie in Endo notified of date change.  Thanks,  [place], [place]

## 2023-08-18 NOTE — Telephone Encounter (Signed)
The patient called in to reschedule her colonoscopy. The patient stated that the best days for her are 10/20/23 or 11/17/23.

## 2023-08-21 DIAGNOSIS — J069 Acute upper respiratory infection, unspecified: Secondary | ICD-10-CM | POA: Insufficient documentation

## 2023-08-21 NOTE — Assessment & Plan Note (Signed)
Vital signs stable, patient in is no sign of acute distress, nontoxic.  Will treat with Augmentin.  Increase fluid intake.  Take over-the-counter plain Mucinex and antihistamine.  Use steam and humidifier. Will let us know if symptoms not improving

## 2023-08-23 NOTE — Progress Notes (Signed)
 Subjective:    Patient ID: Deborah Bartlett, female    DOB: Mar 20, 1961, 63 y.o.   MRN: [id number]  Patient here for  Chief Complaint  Patient presents with   Medical Management of Chronic Issues    HPI Here for a scheduled follow up - f/u regarding her recent issues with cellulitis and lower extremity/pedal edema. Also recently evaluated 08/11/23 - diagnosed with URI - treated with augmentin . Reports she is doing relatively well. Leg is doing much better. Heel - healed well. Persistent pedal and lower extremity swelling, but overall significantly improved. No increased redness. Breathing stable. Went to donate blood. States she was told her hgb was 9.5. denies any bleeding. Taking iron daily.    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   CESAREAN SECTION  1990, 1993, 1999   COLONOSCOPY  2014   Dr Dessa   ORIF ELBOW FRACTURE Left 05/26/2020   Procedure: OPEN REDUCTION INTERNAL FIXATION (ORIF) ELBOW/OLECRANON FRACTURE;  Surgeon: Edie Norleen PARAS, MD;  Location: [facility];  Service: Orthopedics;  Laterality: Left;   SHOULDER ARTHROSCOPY WITH ROTATOR CUFF REPAIR Right 02/21/2018   Procedure: SHOULDER ARTHROSCOPY WITH DEBRIDEMENT DECOMPRESSION AND REPAIR OF ROTATOR CUFF TEAR;  Surgeon: Edie Norleen PARAS, MD;  Location: [facility];  Service: Orthopedics;  Laterality: Right;  Diabetic - diet controlled   Family History  Problem Relation Age of Onset   Hypertension Mother    Diabetes Mother    Diabetes Maternal Grandmother    Diabetes Maternal Grandfather    Breast cancer Neg Hx    Colon cancer Neg Hx    Social History   Socioeconomic History    Marital status: Married    Spouse name: Not on file   Number of children: Not on file   Years of education: Not on file   Highest education level: 12th grade  Occupational History   Not on file  Tobacco Use   Smoking status: Never   Smokeless tobacco: Never  Vaping Use   Vaping status: Never Used  Substance and Sexual Activity   Alcohol use: No    Alcohol/week: 0.0 standard drinks of alcohol   Drug use: No   Sexual activity: Not on file  Other Topics Concern   Not on file  Social History Narrative   Not on file   Social Drivers of Health   Financial Resource Strain: Low Risk  (03/01/2023)   Overall Financial Resource Strain (CARDIA)    Difficulty of Paying Living Expenses: Not hard at all  Food Insecurity: Unknown (06/26/2023)   Hunger Vital Sign    Worried About Running Out of Food in the Last Year: Never true    Ran Out of Food in the Last Year: Patient declined  Transportation Needs: No Transportation Needs (06/26/2023)   PRAPARE - Administrator, Civil Service (Medical): No    Lack of Transportation (Non-Medical): No  Physical Activity: Insufficiently Active (03/01/2023)   Exercise Vital Sign    Days of Exercise per Week: 2 days    Minutes of Exercise per Session: 20 min  Stress: No Stress Concern Present (03/01/2023)  [employer] of Occupational Health - Occupational Stress Questionnaire    Feeling of Stress : Only a little  Social Connections: Socially Integrated (03/01/2023)   Social Connection and Isolation Panel [NHANES]    Frequency of Communication with Friends and Family: More than three times a week    Frequency of Social Gatherings with Friends and Family: Three times a week    Attends Religious Services: More than 4 times per year    Active Member of Clubs or Organizations: Yes    Attends Banker Meetings: 1 to 4 times per year    Marital Status: Married     Review of Systems  Constitutional:  Negative for appetite change,  fever and unexpected weight change.  HENT:  Negative for congestion and sinus pressure.   Respiratory:  Negative for cough, chest tightness and shortness of breath.   Cardiovascular:  Negative for chest pain and palpitations.  Gastrointestinal:  Negative for abdominal pain, diarrhea, nausea and vomiting.  Genitourinary:  Negative for difficulty urinating and dysuria.  Musculoskeletal:  Negative for joint swelling and myalgias.  Skin:        No increased erythema. Heels healed.   Neurological:  Negative for dizziness and headaches.  Psychiatric/Behavioral:  Negative for agitation and dysphoric mood.        Objective:     BP 118/72   Pulse 79   Temp 98.2 F (36.8 C)   Resp 16   Ht 5' (1.524 m)   Wt 158 lb (71.7 kg)   LMP 02/13/2013   SpO2 98%   BMI 30.86 kg/m  Wt Readings from Last 3 Encounters:  08/24/23 158 lb (71.7 kg)  08/11/23 158 lb 9.6 oz (71.9 kg)  08/03/23 157 lb 6.4 oz (71.4 kg)    Physical Exam Vitals reviewed.  Constitutional:      General: She is not in acute distress.    Appearance: Normal appearance.  HENT:     Head: Normocephalic and atraumatic.     Right Ear: External ear normal.     Left Ear: External ear normal.  Eyes:     General: No scleral icterus.       Right eye: No discharge.        Left eye: No discharge.     Conjunctiva/sclera: Conjunctivae normal.  Neck:     Thyroid : No thyromegaly.  Cardiovascular:     Rate and Rhythm: Normal rate and regular rhythm.  Pulmonary:     Effort: No respiratory distress.     Breath sounds: Normal breath sounds. No wheezing.  Abdominal:     General: Bowel sounds are normal.     Palpations: Abdomen is soft.     Tenderness: There is no abdominal tenderness.  Musculoskeletal:        General: No tenderness.     Cervical back: Neck supple. No tenderness.     Comments: Pedal and lower extremity swelling - improved.   Lymphadenopathy:     Cervical: No cervical adenopathy.  Skin:    Findings: No erythema  or rash.  Neurological:     Mental Status: She is alert.  Psychiatric:        Mood and Affect: Mood normal.        Behavior: Behavior normal.      Outpatient Encounter Medications as of 08/24/2023  Medication Sig   amLODipine  (NORVASC ) 5 MG tablet TAKE 1 TABLET BY MOUTH TWICE A DAY   aspirin  81 MG tablet Take 81 mg by mouth  daily.   carvedilol  (COREG ) 12.5 MG tablet TAKE 1 TABLET (12.5MG  TOTAL) BY MOUTH TWICE A DAY WITH MEALS   ferrous sulfate 325 (65 FE) MG tablet Take 325 mg by mouth daily with breakfast.   fexofenadine  (ALLEGRA  ALLERGY) 60 MG tablet Take 1 tablet (60 mg total) by mouth 2 (two) times daily.   guaiFENesin  (MUCINEX ) 600 MG 12 hr tablet Take 1 tablet (600 mg total) by mouth 2 (two) times daily.   Lancets (ONETOUCH ULTRASOFT) lancets Use as instructed to check Blood sugars 3-4 times a week, twice a day.  Dispense One Touch brand. E11.9   losartan  (COZAAR ) 100 MG tablet TAKE 1 TABLET BY MOUTH EVERY DAY   Magnesium  250 MG TABS Take 250 mg by mouth at bedtime.   ondansetron  (ZOFRAN ) 4 MG tablet Take 1 tablet (4 mg total) by mouth every 8 (eight) hours as needed for nausea or vomiting.   ONETOUCH VERIO test strip CHECK BLOOD SUGAR TWICE A DAY 3 TO 4 TIMES A WEEK   pantoprazole  (PROTONIX ) 40 MG tablet TAKE 1 TABLET (40 MG TOTAL) BY MOUTH DAILY TAKE 30 MINUTES BEFORE BREAKFAST   rosuvastatin  (CRESTOR ) 40 MG tablet TAKE 1 TABLET BY MOUTH EVERY DAY   solifenacin  (VESICARE ) 5 MG tablet TAKE 1 TABLET (5 MG TOTAL) BY MOUTH DAILY.   vitamin B-12 (CYANOCOBALAMIN) 1000 MCG tablet Take 1,000 mcg by mouth daily.   Vitamin D3 (VITAMIN D ) 25 MCG tablet Take 1,000 Units by mouth daily.   No facility-administered encounter medications on file as of 08/24/2023.     Lab Results  Component Value Date   WBC 4.2 08/24/2023   HGB 12.1 08/24/2023   HCT 37.5 08/24/2023   PLT 278.0 08/24/2023   GLUCOSE 98 08/24/2023   CHOL 167 08/24/2023   TRIG 134.0 08/24/2023   HDL 47.70 08/24/2023    LDLDIRECT 83.0 03/09/2023   LDLCALC 93 08/24/2023   ALT 13 08/24/2023   AST 16 08/24/2023   NA 139 08/24/2023   K 3.9 08/24/2023   CL 105 08/24/2023   CREATININE 0.88 08/24/2023   BUN 18 08/24/2023   CO2 26 08/24/2023   TSH 2.46 08/24/2023   INR 1.0 11/02/2020   HGBA1C 6.4 08/24/2023   MICROALBUR 17.4 (H) 08/03/2023    US  Venous Img Lower Unilateral Left (DVT) Result Date: 06/27/2023 CLINICAL DATA:  [id number] Edema of left lower leg [id number] Pain of left calf [id number] EXAM: LEFT LOWER EXTREMITY VENOUS DOPPLER ULTRASOUND TECHNIQUE: Gray-scale sonography with compression, as well as color and duplex ultrasound, were performed to evaluate the deep venous system(s) from the level of the common femoral vein through the popliteal and proximal calf veins. COMPARISON:  06/14/2023 FINDINGS: VENOUS Normal compressibility of the common femoral, superficial femoral, and popliteal veins, as well as the visualized calf veins. Visualized portions of profunda femoral vein and great saphenous vein unremarkable. No filling defects to suggest DVT on grayscale or color Doppler imaging. Doppler waveforms show normal direction of venous flow, normal respiratory plasticity and response to augmentation. Limited views of the contralateral common femoral vein are unremarkable. OTHER None. Limitations: none IMPRESSION: 1. No evidence of deep venous thrombosis within the left lower extremity. Electronically Signed   By: Ozell Daring M.D.   On: 06/27/2023 15:47       Assessment & Plan:  Hypercholesterolemia Assessment & Plan: Low-cholesterol diet and exercise.  Follow lipid panel.  Orders: -     CBC with Differential/Platelet -     Hepatic function panel -  TSH -     Lipid panel  Essential hypertension, benign Assessment & Plan: Continues on Coreg , losartan  and amlodipine . On coreg  12.5mg  bid. Blood pressures as outlined.   Orders: -     Basic metabolic panel  Type 2 diabetes mellitus with  hyperglycemia, without long-term current use of insulin (HCC) Assessment & Plan: Diabetic. Low carb diet.  Follow met b and A1c.   Lab Results  Component Value Date   HGBA1C 6.4 08/24/2023     Orders: -     Hemoglobin A1c  Visit for screening mammogram -     3D Screening Mammogram, Left and Right; Future  History of anemia Assessment & Plan: Went to donate blood. Was informed hgb was 9.5. would like rechecked today. On iron daily.   Orders: -     IBC + Ferritin  Swelling of left lower extremity Assessment & Plan: Using lymphedema pump daily to help with swelling.  Evaluated by [facility] as outlined. Swelling has improved. Continue leg elevation and compression hose. Planning to return to work next week. Will need to have periods where she can sit with leg elevated.    Sleep apnea, unspecified type Assessment & Plan:  Recent HST revealed mild obstructive sleep apnea.  Recommended CPAP. Feels better. Benefits from regular cpap use.    Lymphedema Assessment & Plan: Using lymphedema pump daily to help with swelling.  Evaluated by [facility] as outlined. Swelling has improved. Continue leg elevation and compression hose.    Left leg cellulitis Assessment & Plan: No infection currently. Heel - improved.    Gastroesophageal reflux disease, unspecified whether esophagitis present Assessment & Plan: Continue protonix .       Allena Hamilton, MD

## 2023-08-24 ENCOUNTER — Encounter: Payer: Self-pay | Admitting: Internal Medicine

## 2023-08-24 ENCOUNTER — Ambulatory Visit: Payer: BC Managed Care – PPO | Admitting: Internal Medicine

## 2023-08-24 VITALS — BP 118/72 | HR 79 | Temp 98.2°F | Resp 16 | Ht 60.0 in | Wt 158.0 lb

## 2023-08-24 DIAGNOSIS — Z1231 Encounter for screening mammogram for malignant neoplasm of breast: Secondary | ICD-10-CM | POA: Diagnosis not present

## 2023-08-24 DIAGNOSIS — E7800 Pure hypercholesterolemia, unspecified: Secondary | ICD-10-CM

## 2023-08-24 DIAGNOSIS — Z862 Personal history of diseases of the blood and blood-forming organs and certain disorders involving the immune mechanism: Secondary | ICD-10-CM | POA: Diagnosis not present

## 2023-08-24 DIAGNOSIS — G4730 Sleep apnea, unspecified: Secondary | ICD-10-CM

## 2023-08-24 DIAGNOSIS — I10 Essential (primary) hypertension: Secondary | ICD-10-CM

## 2023-08-24 DIAGNOSIS — E1165 Type 2 diabetes mellitus with hyperglycemia: Secondary | ICD-10-CM

## 2023-08-24 DIAGNOSIS — K219 Gastro-esophageal reflux disease without esophagitis: Secondary | ICD-10-CM

## 2023-08-24 DIAGNOSIS — L03116 Cellulitis of left lower limb: Secondary | ICD-10-CM

## 2023-08-24 DIAGNOSIS — M7989 Other specified soft tissue disorders: Secondary | ICD-10-CM

## 2023-08-24 DIAGNOSIS — I890 Lymphedema, not elsewhere classified: Secondary | ICD-10-CM

## 2023-08-24 LAB — HEPATIC FUNCTION PANEL
ALT: 13 U/L (ref 0–35)
AST: 16 U/L (ref 0–37)
Albumin: 4.2 g/dL (ref 3.5–5.2)
Alkaline Phosphatase: 112 U/L (ref 39–117)
Bilirubin, Direct: 0.1 mg/dL (ref 0.0–0.3)
Total Bilirubin: 0.3 mg/dL (ref 0.2–1.2)
Total Protein: 7.3 g/dL (ref 6.0–8.3)

## 2023-08-24 LAB — BASIC METABOLIC PANEL
BUN: 18 mg/dL (ref 6–23)
CO2: 26 meq/L (ref 19–32)
Calcium: 10.2 mg/dL (ref 8.4–10.5)
Chloride: 105 meq/L (ref 96–112)
Creatinine, Ser: 0.88 mg/dL (ref 0.40–1.20)
GFR: 71 mL/min (ref >60.00)
Glucose, Bld: 98 mg/dL (ref 70–99)
Potassium: 3.9 meq/L (ref 3.5–5.1)
Sodium: 139 meq/L (ref 135–145)

## 2023-08-24 LAB — LIPID PANEL
Cholesterol: 167 mg/dL (ref 0–200)
HDL: 47.7 mg/dL (ref >39.00)
LDL Cholesterol: 93 mg/dL (ref 0–99)
NonHDL: 119.67
Total CHOL/HDL Ratio: 4
Triglycerides: 134 mg/dL (ref 0.0–149.0)
VLDL: 26.8 mg/dL (ref 0.0–40.0)

## 2023-08-24 LAB — CBC WITH DIFFERENTIAL/PLATELET
Basophils Absolute: 0.1 10*3/uL (ref 0.0–0.1)
Basophils Relative: 1.4 % (ref 0.0–3.0)
Eosinophils Absolute: 0.1 10*3/uL (ref 0.0–0.7)
Eosinophils Relative: 3.5 % (ref 0.0–5.0)
HCT: 37.5 % (ref 36.0–46.0)
Hemoglobin: 12.1 g/dL (ref 12.0–15.0)
Lymphocytes Relative: 37.8 % (ref 12.0–46.0)
Lymphs Abs: 1.6 10*3/uL (ref 0.7–4.0)
MCHC: 32.4 g/dL (ref 30.0–36.0)
MCV: 88.9 fL (ref 78.0–100.0)
Monocytes Absolute: 0.5 10*3/uL (ref 0.1–1.0)
Monocytes Relative: 11.4 % (ref 3.0–12.0)
Neutro Abs: 1.9 10*3/uL (ref 1.4–7.7)
Neutrophils Relative %: 45.9 % (ref 43.0–77.0)
Platelets: 278 10*3/uL (ref 150.0–400.0)
RBC: 4.21 Mil/uL (ref 3.87–5.11)
RDW: 14.6 % (ref 11.5–15.5)
WBC: 4.2 10*3/uL (ref 4.0–10.5)

## 2023-08-24 LAB — IBC + FERRITIN
Ferritin: 194.9 ng/mL (ref 10.0–291.0)
Iron: 72 ug/dL (ref 42–145)
Saturation Ratios: 26.4 % (ref 20.0–50.0)
TIBC: 273 ug/dL (ref 250.0–450.0)
Transferrin: 195 mg/dL — ABNORMAL LOW (ref 212.0–360.0)

## 2023-08-24 LAB — HEMOGLOBIN A1C: Hgb A1c MFr Bld: 6.4 % (ref 4.6–6.5)

## 2023-08-24 LAB — HM DIABETES FOOT EXAM

## 2023-08-24 LAB — TSH: TSH: 2.46 u[IU]/mL (ref 0.35–5.50)

## 2023-08-25 NOTE — Telephone Encounter (Signed)
 Letter completed and sent in MyChart.  Called pt to be sure that she received it.

## 2023-08-25 NOTE — Telephone Encounter (Signed)
 letter as we discussed.

## 2023-08-27 ENCOUNTER — Encounter: Payer: Self-pay | Admitting: Internal Medicine

## 2023-08-27 NOTE — Assessment & Plan Note (Signed)
 Low cholesterol diet and exercise.  Follow lipid panel.

## 2023-08-27 NOTE — Assessment & Plan Note (Signed)
 Recent HST revealed mild obstructive sleep apnea.  Recommended CPAP. Feels better. Benefits from regular cpap use.

## 2023-08-27 NOTE — Assessment & Plan Note (Signed)
 Continue protonix

## 2023-08-27 NOTE — Assessment & Plan Note (Signed)
 Continues on Coreg, losartan and amlodipine. On coreg 12.5mg  bid. Blood pressures as outlined.

## 2023-08-27 NOTE — Assessment & Plan Note (Signed)
 Diabetic. Low carb diet.  Follow met b and A1c.   Lab Results  Component Value Date   HGBA1C 6.4 08/24/2023

## 2023-08-27 NOTE — Assessment & Plan Note (Signed)
 Went to donate blood. Was informed hgb was 9.5. would like rechecked today. On iron daily.

## 2023-08-27 NOTE — Assessment & Plan Note (Signed)
 No infection currently. Heel - improved.

## 2023-08-27 NOTE — Assessment & Plan Note (Signed)
 Using lymphedema pump daily to help with swelling.  Evaluated by [facility] as outlined. Swelling has improved. Continue leg elevation and compression hose. Planning to return to work next week. Will need to have periods where she can sit with leg elevated.

## 2023-08-27 NOTE — Assessment & Plan Note (Signed)
 Using lymphedema pump daily to help with swelling.  Evaluated by [facility] as outlined. Swelling has improved. Continue leg elevation and compression hose.

## 2023-08-30 ENCOUNTER — Other Ambulatory Visit: Payer: Self-pay | Admitting: Internal Medicine

## 2023-09-01 ENCOUNTER — Encounter: Payer: Self-pay | Admitting: Internal Medicine

## 2023-09-04 NOTE — Telephone Encounter (Signed)
 We had discussed that this would be possible to continue - given it does not change her work. She informed me that she had to do paper work and be up on her feet and she could adjust when she does each activity.

## 2023-09-04 NOTE — Telephone Encounter (Signed)
 Pt returned to work 1/6 with restrictions of sitting periodically. Did you want to see her to determine when she can be released to work with no restrictions?

## 2023-09-05 NOTE — Telephone Encounter (Signed)
 Patient is going to clarify with her boss and let me know what we need to do.

## 2023-09-19 ENCOUNTER — Ambulatory Visit
Admission: RE | Admit: 2023-09-19 | Discharge: 2023-09-19 | Disposition: A | Discharge status: Home / Self Care | Payer: 59 | Source: Ambulatory Visit | Attending: Internal Medicine | Admitting: Internal Medicine

## 2023-09-19 DIAGNOSIS — Z1231 Encounter for screening mammogram for malignant neoplasm of breast: Secondary | ICD-10-CM | POA: Diagnosis present

## 2023-10-06 ENCOUNTER — Encounter: Payer: Self-pay | Admitting: Internal Medicine

## 2023-10-06 ENCOUNTER — Other Ambulatory Visit: Payer: Self-pay

## 2023-10-06 MED ORDER — ONETOUCH VERIO VI STRP: ORAL_STRIP | 12 refills | Status: AC

## 2023-10-13 ENCOUNTER — Encounter: Payer: Self-pay | Admitting: Gastroenterology

## 2023-10-17 ENCOUNTER — Telehealth: Payer: Self-pay

## 2023-10-17 NOTE — Telephone Encounter (Signed)
 Pt has requested to cancel her 10/20/23 colonoscopy with Dr. Tobi Bastos due to work.  She would like to reschedule to June.  Informed her we are not currently scheduling for June but to call back late April to reschedule.  Thanks,  [place], [place]

## 2023-10-20 ENCOUNTER — Ambulatory Visit
Admission: RE | Admit: 2023-10-20 | Payer: BC Managed Care – PPO | Source: Ambulatory Visit | Admitting: Gastroenterology

## 2023-10-20 SURGERY — COLONOSCOPY WITH PROPOFOL
Anesthesia: General

## 2023-10-25 NOTE — Progress Notes (Signed)
 MRN : [id number]  Deborah Bartlett is a 63 y.o. ([birth year]) female who presents with chief complaint of legs swell.  History of Present Illness:   The patient returns to the office for followup evaluation regarding leg swelling.  The swelling has improved quite a bit and the pain associated with swelling has decreased substantially. There have not been any interval development of a ulcerations or wounds.  Since the previous visit the patient has been wearing graduated compression stockings pretty much on a routine basis and has noted some improvement in the lymphedema. The patient has been using compression routinely morning until night.  The patient also states elevation during the day and exercise (such as walking) is being done too.  She also has a lymphedema pump which she uses on a daily basis.  She states the machine is working well and it has been hugely positive for controlling her edema.    No outpatient medications have been marked as taking for the 10/30/23 encounter (Appointment) with Gilda Crease, Latina Craver, MD.    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   CESAREAN SECTION  1990, 1993, 1999   COLONOSCOPY  2014   Dr Lemar Livings   ORIF ELBOW FRACTURE Left 05/26/2020   Procedure: OPEN REDUCTION INTERNAL FIXATION (ORIF) ELBOW/OLECRANON FRACTURE;  Surgeon: Christena Flake, MD;  Location: [facility];  Service: Orthopedics;  Laterality: Left;   SHOULDER ARTHROSCOPY WITH ROTATOR CUFF REPAIR Right 02/21/2018   Procedure: SHOULDER ARTHROSCOPY WITH DEBRIDEMENT DECOMPRESSION AND REPAIR OF ROTATOR CUFF TEAR;  Surgeon: Christena Flake, MD;  Location: [facility];  Service: Orthopedics;  Laterality: Right;  Diabetic - diet controlled    Social History Social History   Tobacco Use   Smoking status: Never   Smokeless tobacco: Never  Vaping Use   Vaping status: Never Used  Substance Use Topics   Alcohol use: No    Alcohol/week: 0.0 standard drinks of alcohol   Drug use: No    Family History Family History  Problem Relation Age of Onset   Hypertension Mother    Diabetes Mother    Diabetes Maternal Grandmother    Diabetes Maternal Grandfather    Breast cancer Neg Hx    Colon cancer Neg Hx     Allergies  Allergen Reactions   Codeine Palpitations    dizzy     REVIEW OF SYSTEMS (Negative unless checked)  Constitutional: [] Weight loss  [] Fever  [] Chills Cardiac: [] Chest pain   [] Chest pressure   [] Palpitations   [] Shortness of breath when laying flat   [] Shortness of breath with exertion. Vascular:  [] Pain in legs with walking   [x] Pain in legs with standing  [] History of DVT   [] Phlebitis   [x] Swelling in legs   [] Varicose veins   [] Non-healing ulcers Pulmonary:   [] Uses home oxygen   [] Productive cough   [] Hemoptysis   [] Wheeze  [] COPD   [] Asthma Neurologic:  [] Dizziness   [] Seizures   [] History  of stroke   [] History of TIA  [] Aphasia   [] Vissual changes   [] Weakness or numbness in arm   [] Weakness or numbness in leg Musculoskeletal:   [] Joint swelling   [] Joint pain   [] Low back pain Hematologic:  [] Easy bruising  [] Easy bleeding   [] Hypercoagulable state   [] Anemic Gastrointestinal:  [] Diarrhea   [] Vomiting  [x] Gastroesophageal reflux/heartburn   [] Difficulty swallowing. Genitourinary:  [] Chronic kidney disease   [] Difficult urination  [] Frequent urination   [] Blood in urine Skin:  [] Rashes   [] Ulcers  Psychological:  [] History of anxiety   []  History of major depression.  Physical Examination  There were no vitals filed for this visit. There is no height or weight on file to calculate  BMI. Gen: WD/WN, NAD Head: [place]/AT, No temporalis wasting.  Ear/Nose/Throat: Hearing grossly intact, nares w/o erythema or drainage, pinna without lesions Eyes: PER, EOMI, sclera nonicteric.  Neck: Supple, no gross masses.  No JVD.  Pulmonary:  Good air movement, no audible wheezing, no use of accessory muscles.  Cardiac: RRR, precordium not hyperdynamic. Vascular:  scattered varicosities present bilaterally.  Mild venous stasis changes to the legs bilaterally.  2+ soft pitting edema, CEAP [id number]  Vessel Right Left  Radial Palpable Palpable  Gastrointestinal: soft, non-distended. No guarding/no peritoneal signs.  Musculoskeletal: M/S 5/5 throughout.  No deformity.  Neurologic: CN 2-12 intact. Pain and light touch intact in extremities.  Symmetrical.  Speech is fluent. Motor exam as listed above. Psychiatric: Judgment intact, Mood & affect appropriate for pt's clinical situation. Dermatologic: Venous rashes no ulcers noted.  No changes consistent with cellulitis. Lymph : No lichenification or skin changes of chronic lymphedema.  CBC Lab Results  Component Value Date   WBC 4.2 08/24/2023   HGB 12.1 08/24/2023   HCT 37.5 08/24/2023   MCV 88.9 08/24/2023   PLT 278.0 08/24/2023    BMET    Component Value Date/Time   NA 139 08/24/2023 1151   NA 141 10/31/2018 0849   NA 140 12/05/2014 0736   K 3.9 08/24/2023 1151   K 3.8 12/06/2014 1053   CL 105 08/24/2023 1151   CL 104 12/05/2014 0736   CO2 26 08/24/2023 1151   CO2 28 12/05/2014 0736   GLUCOSE 98 08/24/2023 1151   GLUCOSE 121 (H) 12/05/2014 0736   BUN 18 08/24/2023 1151   BUN 17 10/31/2018 0849   BUN 15 12/05/2014 0736   CREATININE 0.88 08/24/2023 1151   CREATININE 0.81 12/06/2014 1053   CALCIUM 10.2 08/24/2023 1151   CALCIUM 8.9 12/05/2014 0736   GFRNONAA >60 06/30/2023 1711   GFRNONAA >60 12/06/2014 1053   GFRAA 76 10/31/2018 0849   GFRAA >60 12/06/2014 1053   CrCl cannot be calculated (Patient's most recent lab  result is older than the maximum 21 days allowed.).  COAG Lab Results  Component Value Date   INR 1.0 11/02/2020    Radiology No results found.   Assessment/Plan 1. Lymphedema (Primary) Recommend:  No surgery or intervention at this point in time.    I have reviewed my discussion with the patient regarding lymphedema and why it  causes symptoms.  Patient will continue wearing graduated compression on a daily basis. The patient should put the compression on first thing in the morning and removing them in the evening. The patient should not sleep in the compression.   In addition, behavioral modification throughout the day will be continued.  This will include frequent elevation (such as in a recliner), use of over  the counter pain medications as needed and exercise such as walking.  The systemic causes for chronic edema such as liver, kidney and cardiac etiologies does not appear to have significant changed over the past year.    The patient will continue aggressive use of the  lymph pump.  This will continue to improve the edema control and prevent sequela such as ulcers and infections.   The patient will follow-up with me on an annual basis.   2. Essential hypertension, benign Continue antihypertensive medications as already ordered, these medications have been reviewed and there are no changes at this time.  3. Type 2 diabetes mellitus with hyperglycemia, without long-term current use of insulin (HCC) Continue hypoglycemic medications as already ordered, these medications have been reviewed and there are no changes at this time.  Hgb A1C to be monitored as already arranged by primary service  4. Hypercholesterolemia Continue statin as ordered and reviewed, no changes at this time    Levora Dredge, MD  10/25/2023 3:41 PM

## 2023-10-28 ENCOUNTER — Other Ambulatory Visit: Payer: Self-pay | Admitting: Internal Medicine

## 2023-10-30 ENCOUNTER — Encounter (INDEPENDENT_AMBULATORY_CARE_PROVIDER_SITE_OTHER): Payer: Self-pay | Admitting: Vascular Surgery

## 2023-10-30 ENCOUNTER — Ambulatory Visit (INDEPENDENT_AMBULATORY_CARE_PROVIDER_SITE_OTHER): Payer: BC Managed Care – PPO | Admitting: Vascular Surgery

## 2023-10-30 VITALS — BP 123/78 | HR 79 | Resp 18 | Ht 60.0 in | Wt 162.2 lb

## 2023-10-30 DIAGNOSIS — I890 Lymphedema, not elsewhere classified: Secondary | ICD-10-CM

## 2023-10-30 DIAGNOSIS — I10 Essential (primary) hypertension: Secondary | ICD-10-CM

## 2023-10-30 DIAGNOSIS — E1165 Type 2 diabetes mellitus with hyperglycemia: Secondary | ICD-10-CM

## 2023-10-30 DIAGNOSIS — E7800 Pure hypercholesterolemia, unspecified: Secondary | ICD-10-CM

## 2023-11-23 ENCOUNTER — Ambulatory Visit (INDEPENDENT_AMBULATORY_CARE_PROVIDER_SITE_OTHER): Payer: 59 | Admitting: Internal Medicine

## 2023-11-23 VITALS — BP 118/70 | HR 70 | Temp 98.0°F | Resp 16 | Ht 60.0 in | Wt 164.0 lb

## 2023-11-23 DIAGNOSIS — I10 Essential (primary) hypertension: Secondary | ICD-10-CM | POA: Diagnosis not present

## 2023-11-23 DIAGNOSIS — E279 Disorder of adrenal gland, unspecified: Secondary | ICD-10-CM

## 2023-11-23 DIAGNOSIS — G4730 Sleep apnea, unspecified: Secondary | ICD-10-CM

## 2023-11-23 DIAGNOSIS — Z0000 Encounter for general adult medical examination without abnormal findings: Secondary | ICD-10-CM | POA: Diagnosis not present

## 2023-11-23 DIAGNOSIS — E7800 Pure hypercholesterolemia, unspecified: Secondary | ICD-10-CM

## 2023-11-23 DIAGNOSIS — K219 Gastro-esophageal reflux disease without esophagitis: Secondary | ICD-10-CM

## 2023-11-23 DIAGNOSIS — R748 Abnormal levels of other serum enzymes: Secondary | ICD-10-CM

## 2023-11-23 DIAGNOSIS — M7989 Other specified soft tissue disorders: Secondary | ICD-10-CM

## 2023-11-23 DIAGNOSIS — E1165 Type 2 diabetes mellitus with hyperglycemia: Secondary | ICD-10-CM

## 2023-11-23 DIAGNOSIS — I890 Lymphedema, not elsewhere classified: Secondary | ICD-10-CM

## 2023-11-23 MED ORDER — SOLIFENACIN SUCCINATE 5 MG PO TABS: 5.0000 mg | ORAL_TABLET | Freq: Every day | ORAL | 1 refills | Status: DC

## 2023-11-23 MED ORDER — PANTOPRAZOLE SODIUM 40 MG PO TBEC: 40.0000 mg | DELAYED_RELEASE_TABLET | Freq: Every day | ORAL | 1 refills | Status: DC

## 2023-11-23 MED ORDER — LOSARTAN POTASSIUM 100 MG PO TABS
100.0000 mg | ORAL_TABLET | Freq: Every day | ORAL | 1 refills | Status: DC
Start: 2023-11-23 — End: 2024-06-13

## 2023-11-23 MED ORDER — CARVEDILOL 12.5 MG PO TABS: 12.5000 mg | ORAL_TABLET | Freq: Two times a day (BID) | ORAL | 1 refills | Status: DC

## 2023-11-23 NOTE — Assessment & Plan Note (Signed)
 Physical today 11/23/23.  PAP 06/06/22 - negative with negative HPV. Mammogram - 09/19/23 - Birads I.  Colonoscopy 05/2013.  Recommended f/u in 10 years.  Had to cancel recently. Planning to reschedule for the summer.

## 2023-11-23 NOTE — Progress Notes (Signed)
 Subjective:    Patient ID: Deborah Bartlett, female    DOB: [birth year], 63 y.o.   MRN: [id number]  Patient here for  Chief Complaint  Patient presents with   Annual Exam    HPI Here for a physical exam.  Had f/u with [facility] 10/30/23 - f/u leg swelling. Wearing compression hose. Also has been using her lymphedema pump daily. Walking. Continues on coreg, losartan and amlodipine. Continues cpap. She is doing relatively well. Still some swelling, but overall improved with above regimen. No pain. Breathing stable. No chest pain reported. Needs note for work.    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   CESAREAN SECTION  1990, 1993, 1999   COLONOSCOPY  2014   Dr Lemar Livings   ORIF ELBOW FRACTURE Left 05/26/2020   Procedure: OPEN REDUCTION INTERNAL FIXATION (ORIF) ELBOW/OLECRANON FRACTURE;  Surgeon: Christena Flake, MD;  Location: [facility];  Service: Orthopedics;  Laterality: Left;   SHOULDER ARTHROSCOPY WITH ROTATOR CUFF REPAIR Right 02/21/2018   Procedure: SHOULDER ARTHROSCOPY WITH DEBRIDEMENT DECOMPRESSION AND REPAIR OF ROTATOR CUFF TEAR;  Surgeon: Christena Flake, MD;  Location: [facility];  Service: Orthopedics;  Laterality: Right;  Diabetic - diet controlled   Family History  Problem Relation Age of Onset   Hypertension Mother    Diabetes Mother    Diabetes Maternal Grandmother    Diabetes Maternal Grandfather    Breast cancer Neg Hx    Colon cancer Neg Hx    Social History   Socioeconomic History   Marital status: Married    Spouse name: Not on file   Number of children: Not on file   Years of education: Not on file   Highest education  level: 12th grade  Occupational History   Not on file  Tobacco Use   Smoking status: Never   Smokeless tobacco: Never  Vaping Use   Vaping status: Never Used  Substance and Sexual Activity   Alcohol use: No    Alcohol/week: 0.0 standard drinks of alcohol   Drug use: No   Sexual activity: Not on file  Other Topics Concern   Not on file  Social History Narrative   Not on file   Social Drivers of Health   Financial Resource Strain: Low Risk  (03/01/2023)   Overall Financial Resource Strain (CARDIA)    Difficulty of Paying Living Expenses: Not hard at all  Food Insecurity: Unknown (06/26/2023)   Hunger Vital Sign    Worried About Running Out of Food in the Last Year: Never true    Ran Out of Food in the Last Year: Patient declined  Transportation Needs: No Transportation Needs (06/26/2023)   PRAPARE - Administrator, Civil Service (Medical): No    Lack of Transportation (Non-Medical): No  Physical Activity: Insufficiently Active (03/01/2023)   Exercise Vital Sign    Days of Exercise per Week: 2 days    Minutes of Exercise per Session: 20 min  Stress: No Stress Concern Present (03/01/2023)   [employer] of Occupational Health - Occupational Stress Questionnaire    Feeling of Stress : Only a little  Social Connections: Socially Integrated (03/01/2023)   Social Connection and Isolation Panel [NHANES]    Frequency of Communication with Friends and Family: More than three times a week    Frequency of Social Gatherings with Friends and Family: Three times a week    Attends Religious Services: More than 4 times per year    Active Member of Clubs or Organizations: Yes    Attends Banker Meetings: 1 to 4 times per year    Marital Status: Married     Review of Systems  Constitutional:  Negative for appetite change and unexpected weight change.  HENT:  Negative for congestion, sinus pressure and sore throat.   Eyes:  Negative for pain and visual  disturbance.  Respiratory:  Negative for cough, chest tightness and shortness of breath.   Cardiovascular:  Negative for chest pain and palpitations.       Leg swelling improved.   Gastrointestinal:  Negative for abdominal pain, diarrhea, nausea and vomiting.  Genitourinary:  Negative for difficulty urinating and dysuria.  Musculoskeletal:  Negative for joint swelling and myalgias.  Skin:  Negative for color change and rash.  Neurological:  Negative for dizziness and headaches.  Hematological:  Negative for adenopathy. Does not bruise/bleed easily.  Psychiatric/Behavioral:  Negative for agitation and dysphoric mood.        Objective:     BP 118/70   Pulse 70   Temp 98 F (36.7 C)   Resp 16   Ht 5' (1.524 m)   Wt 164 lb (74.4 kg)   LMP 02/13/2013   SpO2 98%   BMI 32.03 kg/m  Wt Readings from Last 3 Encounters:  11/23/23 164 lb (74.4 kg)  10/30/23 162 lb 3.2 oz (73.6 kg)  08/24/23 158 lb (71.7 kg)    Physical Exam Vitals reviewed.  Constitutional:      General: She is not in acute distress.    Appearance: Normal appearance. She is well-developed.  HENT:     Head: Normocephalic and atraumatic.     Right Ear: External ear normal.     Left Ear: External ear normal.     Mouth/Throat:     Pharynx: No oropharyngeal exudate or posterior oropharyngeal erythema.  Eyes:     General: No scleral icterus.       Right eye: No discharge.        Left eye: No discharge.     Conjunctiva/sclera: Conjunctivae normal.  Neck:     Thyroid: No thyromegaly.  Cardiovascular:     Rate and Rhythm: Normal rate and regular rhythm.  Pulmonary:     Effort: No tachypnea, accessory muscle usage or respiratory distress.     Breath sounds: Normal breath sounds. No decreased breath sounds or wheezing.  Chest:  Breasts:    Right: No inverted nipple, mass, nipple discharge or tenderness (no axillary adenopathy).     Left: No inverted nipple, mass, nipple discharge or tenderness (no axilarry  adenopathy).  Abdominal:     General: Bowel sounds are normal.     Palpations: Abdomen is soft.     Tenderness: There is no abdominal tenderness.  Musculoskeletal:        General: No tenderness.     Cervical back: Neck supple.     Comments: Pedal and lower extremity swelling - improved. No increased erythema. No pain to palpation.   Lymphadenopathy:     Cervical: No cervical adenopathy.  Skin:    Findings: No erythema or rash.  Neurological:  Mental Status: She is alert and oriented to person, place, and time.  Psychiatric:        Mood and Affect: Mood normal.        Behavior: Behavior normal.         Outpatient Encounter Medications as of 11/23/2023  Medication Sig   amLODipine (NORVASC) 5 MG tablet TAKE 1 TABLET BY MOUTH TWICE A DAY   aspirin 81 MG tablet Take 81 mg by mouth daily.   carvedilol (COREG) 12.5 MG tablet Take 1 tablet (12.5 mg total) by mouth 2 (two) times daily with a meal.   ferrous sulfate 325 (65 FE) MG tablet Take 325 mg by mouth daily with breakfast.   fexofenadine (ALLEGRA ALLERGY) 60 MG tablet Take 1 tablet (60 mg total) by mouth 2 (two) times daily.   glucose blood (ONETOUCH VERIO) test strip CHECK BLOOD SUGAR TWICE A DAY 3 TO 4 TIMES A WEEK   guaiFENesin (MUCINEX) 600 MG 12 hr tablet Take 1 tablet (600 mg total) by mouth 2 (two) times daily.   Lancets (ONETOUCH ULTRASOFT) lancets Use as instructed to check Blood sugars 3-4 times a week, twice a day.  Dispense One Touch brand. E11.9   losartan (COZAAR) 100 MG tablet Take 1 tablet (100 mg total) by mouth daily.   Magnesium 250 MG TABS Take 250 mg by mouth at bedtime.   ondansetron (ZOFRAN) 4 MG tablet Take 1 tablet (4 mg total) by mouth every 8 (eight) hours as needed for nausea or vomiting.   pantoprazole (PROTONIX) 40 MG tablet Take 1 tablet (40 mg total) by mouth daily before breakfast.   rosuvastatin (CRESTOR) 40 MG tablet TAKE 1 TABLET BY MOUTH EVERY DAY   solifenacin (VESICARE) 5 MG tablet Take 1  tablet (5 mg total) by mouth daily.   vitamin B-12 (CYANOCOBALAMIN) 1000 MCG tablet Take 1,000 mcg by mouth daily.   Vitamin D3 (VITAMIN D) 25 MCG tablet Take 1,000 Units by mouth daily.   [DISCONTINUED] carvedilol (COREG) 12.5 MG tablet TAKE 1 TABLET (12.5MG  TOTAL) BY MOUTH TWICE A DAY WITH MEALS   [DISCONTINUED] losartan (COZAAR) 100 MG tablet TAKE 1 TABLET BY MOUTH EVERY DAY   [DISCONTINUED] pantoprazole (PROTONIX) 40 MG tablet TAKE 1 TABLET BY MOUTH DAILY TAKE 30 MINUTES BEFORE BREAKFAST   [DISCONTINUED] solifenacin (VESICARE) 5 MG tablet TAKE 1 TABLET (5 MG TOTAL) BY MOUTH DAILY.   No facility-administered encounter medications on file as of 11/23/2023.     Lab Results  Component Value Date   WBC 4.2 08/24/2023   HGB 12.1 08/24/2023   HCT 37.5 08/24/2023   PLT 278.0 08/24/2023   GLUCOSE 98 08/24/2023   CHOL 167 08/24/2023   TRIG 134.0 08/24/2023   HDL 47.70 08/24/2023   LDLDIRECT 83.0 03/09/2023   LDLCALC 93 08/24/2023   ALT 13 08/24/2023   AST 16 08/24/2023   NA 139 08/24/2023   K 3.9 08/24/2023   CL 105 08/24/2023   CREATININE 0.88 08/24/2023   BUN 18 08/24/2023   CO2 26 08/24/2023   TSH 2.46 08/24/2023   INR 1.0 11/02/2020   HGBA1C 6.4 08/24/2023   MICROALBUR 17.4 (H) 08/03/2023    MM 3D SCREENING MAMMOGRAM BILATERAL BREAST Result Date: 09/21/2023 CLINICAL DATA:  Screening. EXAM: DIGITAL SCREENING BILATERAL MAMMOGRAM WITH TOMOSYNTHESIS AND CAD TECHNIQUE: Bilateral screening digital craniocaudal and mediolateral oblique mammograms were obtained. Bilateral screening digital breast tomosynthesis was performed. The images were evaluated with computer-aided detection. COMPARISON:  Previous exam(s). ACR Breast Density Category b:  There are scattered areas of fibroglandular density. FINDINGS: There are no findings suspicious for malignancy. IMPRESSION: No mammographic evidence of malignancy. A result letter of this screening mammogram will be mailed directly to the patient.  RECOMMENDATION: Screening mammogram in one year. (Code:[id number]-B-01Y) BI-RADS CATEGORY  1: Negative. Electronically Signed   By: Harmon Pier M.D.   On: 09/21/2023 15:51       Assessment & Plan:  Routine general medical examination at a health care facility  Type 2 diabetes mellitus with hyperglycemia, without long-term current use of insulin (HCC) Assessment & Plan: Continue low carb diet and exercise. Follow met b and A1c.   Orders: -     Hemoglobin A1c; Future  Essential hypertension, benign Assessment & Plan: Continues on Coreg, losartan and amlodipine. On coreg 12.5mg  bid. Blood pressures as outlined. No changes today. Follow pressures.  Follow metabolic panel.   Orders: -     Basic metabolic panel with GFR; Future  Swelling of left lower extremity  Sleep apnea, unspecified type Assessment & Plan: Continue cpap.    Hypercholesterolemia Assessment & Plan: Low cholesterol diet and exercise. Follow lipid panel.   Orders: -     CBC with Differential/Platelet; Future -     Hepatic function panel; Future -     Lipid panel; Future -     IBC + Ferritin; Future  Health care maintenance Assessment & Plan: Physical today 11/23/23.  PAP 06/06/22 - negative with negative HPV. Mammogram - 09/19/23 - Birads I.  Colonoscopy 05/2013.  Recommended f/u in 10 years.  Had to cancel recently. Planning to reschedule for the summer.    Elevated alkaline phosphatase level Assessment & Plan: Recent checks wnl.    Gastroesophageal reflux disease, unspecified whether esophagitis present Assessment & Plan: Continue protonix. No upper symptoms reported.    Lesion of adrenal gland [facility]) Assessment & Plan: Consistent with adrenal adenoma.  Work-up unrevealing.  Seeing Dr Val EagleElveria Rising. Saw endocrinology 12/07/22 - f/u adrenal adenoma.  Labs - no evidence of adrenal excess.    Lymphedema Assessment & Plan: Using lymphedema pump daily. Swelling has improved. Continue compression hose and continue  the pump daily. Work note given for continued restrictions.    Other orders -     Carvedilol; Take 1 tablet (12.5 mg total) by mouth 2 (two) times daily with a meal.  Dispense: 180 tablet; Refill: 1 -     Losartan Potassium; Take 1 tablet (100 mg total) by mouth daily.  Dispense: 90 tablet; Refill: 1 -     Pantoprazole Sodium; Take 1 tablet (40 mg total) by mouth daily before breakfast.  Dispense: 90 tablet; Refill: 1 -     Solifenacin Succinate; Take 1 tablet (5 mg total) by mouth daily.  Dispense: 90 tablet; Refill: 1     Dale , MD

## 2023-11-26 ENCOUNTER — Encounter: Payer: Self-pay | Admitting: Internal Medicine

## 2023-11-26 NOTE — Assessment & Plan Note (Addendum)
Continue protonix.  No upper symptoms reported.

## 2023-11-26 NOTE — Assessment & Plan Note (Signed)
 Recent checks wnl.

## 2023-11-26 NOTE — Assessment & Plan Note (Signed)
 Continue low carb diet and exercise. Follow met b and A1c.

## 2023-11-26 NOTE — Assessment & Plan Note (Signed)
 Using lymphedema pump daily. Swelling has improved. Continue compression hose and continue the pump daily. Work note given for continued restrictions.

## 2023-11-26 NOTE — Assessment & Plan Note (Signed)
 Continue cpap.

## 2023-11-26 NOTE — Assessment & Plan Note (Signed)
 Low cholesterol diet and exercise.  Follow lipid panel.

## 2023-11-26 NOTE — Assessment & Plan Note (Signed)
Consistent with adrenal adenoma.  Work-up unrevealing.  Seeing Dr Val EagleElveria Rising. Saw endocrinology 12/07/22 - f/u adrenal adenoma.  Labs - no evidence of adrenal excess.

## 2023-11-26 NOTE — Assessment & Plan Note (Signed)
 Continues on Coreg, losartan and amlodipine. On coreg 12.5mg  bid. Blood pressures as outlined. No changes today. Follow pressures.  Follow metabolic panel.

## 2023-12-20 ENCOUNTER — Encounter: Payer: Self-pay | Admitting: Internal Medicine

## 2023-12-21 NOTE — Telephone Encounter (Signed)
 Ok

## 2023-12-21 NOTE — Telephone Encounter (Signed)
Ok for handicap form

## 2023-12-21 NOTE — Telephone Encounter (Signed)
 Form placed out for signature. LM for patient to let her know to come pick up today or tomorrow

## 2024-01-04 ENCOUNTER — Other Ambulatory Visit

## 2024-01-07 ENCOUNTER — Encounter: Payer: Self-pay | Admitting: Internal Medicine

## 2024-01-08 ENCOUNTER — Ambulatory Visit

## 2024-01-08 ENCOUNTER — Ambulatory Visit (INDEPENDENT_AMBULATORY_CARE_PROVIDER_SITE_OTHER): Admitting: Internal Medicine

## 2024-01-08 ENCOUNTER — Encounter: Payer: Self-pay | Admitting: Internal Medicine

## 2024-01-08 VITALS — BP 124/78 | HR 86 | Temp 97.8°F | Ht 60.0 in | Wt 158.8 lb

## 2024-01-08 DIAGNOSIS — J22 Unspecified acute lower respiratory infection: Secondary | ICD-10-CM | POA: Diagnosis not present

## 2024-01-08 MED ORDER — AMOXICILLIN-POT CLAVULANATE 875-125 MG PO TABS: 1.0000 | ORAL_TABLET | Freq: Two times a day (BID) | ORAL | 0 refills | Status: DC

## 2024-01-08 MED ORDER — PREDNISONE 20 MG PO TABS
40.0000 mg | ORAL_TABLET | Freq: Every day | ORAL | 0 refills | Status: DC
Start: 2024-01-08 — End: 2024-07-09

## 2024-01-08 NOTE — Progress Notes (Signed)
 Acute Office Visit  Subjective:     Patient ID: Deborah Bartlett, female    DOB: Sep 28, 1960, 63 y.o.   MRN: [id number]  Chief Complaint  Patient presents with   Cough    Cough & congestion x 10 days Coughing is so bad she feels as if she can not catch her breath     Cough Pertinent negatives include no chills, fever, hemoptysis, sore throat, shortness of breath or wheezing.   Patient is in today for acute cough over the last 10 days.  Patient states that she has had a persistent cough over the last 10 days that has not improved.  Patient states that cough is constant with minimal sputum production.  No fevers or chills noted.  She denies any rhinorrhea or sinus congestion.  Does complain of some irritation at the back of her throat from drainage.  Review of Systems  Constitutional:  Positive for malaise/fatigue. Negative for chills and fever.  HENT:  Negative for congestion, sinus pain and sore throat.   Respiratory:  Positive for cough and sputum production. Negative for hemoptysis, shortness of breath and wheezing.        Complains of minimal sputum production  Cardiovascular: Negative.   Gastrointestinal: Negative.   Musculoskeletal: Negative.   Neurological: Negative.   Psychiatric/Behavioral: Negative.          Objective:    BP 124/78   Pulse 86   Temp 97.8 F (36.6 C)   Ht 5' (1.524 m)   Wt 158 lb 12.8 oz (72 kg)   LMP 02/13/2013   SpO2 96%   BMI 31.01 kg/m    Physical Exam Constitutional:      Appearance: Normal appearance.  HENT:     Head: Normocephalic and atraumatic.     Nose:     Right Sinus: No maxillary sinus tenderness or frontal sinus tenderness.     Left Sinus: No maxillary sinus tenderness or frontal sinus tenderness.     Mouth/Throat:     Pharynx: Oropharynx is clear. No oropharyngeal exudate or posterior oropharyngeal erythema.  Cardiovascular:     Rate and Rhythm: Normal rate and regular rhythm.     Heart sounds: Normal heart sounds.   Pulmonary:     Effort: No respiratory distress.     Breath sounds: Normal breath sounds. No rales.     Comments: Bilateral scattered wheezes noted on exam more on the right than the left Neurological:     Mental Status: She is alert and oriented to person, place, and time.  Psychiatric:        Mood and Affect: Mood normal.        Behavior: Behavior normal.     No results found for any visits on 01/08/24.      Assessment & Plan:   Problem List Items Addressed This Visit       Respiratory   LRTI (lower respiratory tract infection) - Primary   - Patient presents today with persistent cough over the last 10 days as well as irritation over the back of her throat.  Patient states that she initially had a sore throat but this has since resolved. -She denies any fevers or chills.  No nasal congestion or sinus congestion. -Patient states that she has not had any improvement in her symptoms over the last 10 days despite conservative measures including over-the-counter cough syrup and a daily antihistamine -On exam, patient was noted to have bilateral scattered expiratory wheezes more on the right  than the left (no history of smoking) -Given the persistent symptoms without any improvement as well as more right-sided lung findings and left I am concerned for a possible pneumonia -Will obtain chest x-ray for further evaluation -I will also start the patient on Augmentin  to complete a 5-day course -Given the patient's wheezing would also start patient on a 5-day course of prednisone  (40 mg for 5 days) -Return precautions given to the patient -No further workup at this time      Relevant Medications   amoxicillin -clavulanate (AUGMENTIN ) 875-125 MG tablet   predniSONE  (DELTASONE ) 20 MG tablet   Other Relevant Orders   DG Chest 2 View    Meds ordered this encounter  Medications   amoxicillin -clavulanate (AUGMENTIN ) 875-125 MG tablet    Sig: Take 1 tablet by mouth 2 (two) times daily.     Dispense:  10 tablet    Refill:  0   predniSONE  (DELTASONE ) 20 MG tablet    Sig: Take 2 tablets (40 mg total) by mouth daily with breakfast.    Dispense:  10 tablet    Refill:  0    No follow-ups on file.  Lyllian Gause, MD

## 2024-01-08 NOTE — Telephone Encounter (Signed)
Patient scheduled for acute visit.

## 2024-01-08 NOTE — Assessment & Plan Note (Signed)
-   Patient presents today with persistent cough over the last 10 days as well as irritation over the back of her throat.  Patient states that she initially had a sore throat but this has since resolved. -She denies any fevers or chills.  No nasal congestion or sinus congestion. -Patient states that she has not had any improvement in her symptoms over the last 10 days despite conservative measures including over-the-counter cough syrup and a daily antihistamine -On exam, patient was noted to have bilateral scattered expiratory wheezes more on the right than the left (no history of smoking) -Given the persistent symptoms without any improvement as well as more right-sided lung findings and left I am concerned for a possible pneumonia -Will obtain chest x-ray for further evaluation -I will also start the patient on Augmentin  to complete a 5-day course -Given the patient's wheezing would also start patient on a 5-day course of prednisone  (40 mg for 5 days) -Return precautions given to the patient -No further workup at this time

## 2024-01-08 NOTE — Patient Instructions (Addendum)
-   Was a pleasure meeting you today -Your blood pressure is well-controlled.  Continue with the current medication regimen -I am concerned about a possible underlying pneumonia given your persistent cough and your lung exam.  We will obtain a chest x-ray for further evaluation -Will start you on an antibiotic today (Augmentin  to complete a 5-day course)  - Will start you on a short course of prednisone  given the wheezing heard on your lung exam -Continue with conservative measures including over-the-counter cough syrup (Mucinex ), warm tea, warm showers with steam inhalation as well as over-the-counter antihistamines

## 2024-01-09 ENCOUNTER — Encounter (INDEPENDENT_AMBULATORY_CARE_PROVIDER_SITE_OTHER): Payer: Self-pay

## 2024-01-09 ENCOUNTER — Ambulatory Visit: Payer: Self-pay

## 2024-01-10 ENCOUNTER — Other Ambulatory Visit

## 2024-01-29 ENCOUNTER — Other Ambulatory Visit (INDEPENDENT_AMBULATORY_CARE_PROVIDER_SITE_OTHER)

## 2024-01-29 ENCOUNTER — Ambulatory Visit: Payer: Self-pay | Admitting: Internal Medicine

## 2024-01-29 DIAGNOSIS — I10 Essential (primary) hypertension: Secondary | ICD-10-CM

## 2024-01-29 DIAGNOSIS — E1165 Type 2 diabetes mellitus with hyperglycemia: Secondary | ICD-10-CM | POA: Diagnosis not present

## 2024-01-29 DIAGNOSIS — E7800 Pure hypercholesterolemia, unspecified: Secondary | ICD-10-CM | POA: Diagnosis not present

## 2024-01-29 LAB — CBC WITH DIFFERENTIAL/PLATELET
Basophils Absolute: 0.1 10*3/uL (ref 0.0–0.1)
Basophils Relative: 1.5 % (ref 0.0–3.0)
Eosinophils Absolute: 0.3 10*3/uL (ref 0.0–0.7)
Eosinophils Relative: 6.3 % — ABNORMAL HIGH (ref 0.0–5.0)
HCT: 38.8 % (ref 36.0–46.0)
Hemoglobin: 12.8 g/dL (ref 12.0–15.0)
Lymphocytes Relative: 38.1 % (ref 12.0–46.0)
Lymphs Abs: 1.6 10*3/uL (ref 0.7–4.0)
MCHC: 32.9 g/dL (ref 30.0–36.0)
MCV: 85.9 fl (ref 78.0–100.0)
Monocytes Absolute: 0.5 10*3/uL (ref 0.1–1.0)
Monocytes Relative: 11 % (ref 3.0–12.0)
Neutro Abs: 1.9 10*3/uL (ref 1.4–7.7)
Neutrophils Relative %: 43.1 % (ref 43.0–77.0)
Platelets: 269 10*3/uL (ref 150.0–400.0)
RBC: 4.52 Mil/uL (ref 3.87–5.11)
RDW: 14.6 % (ref 11.5–15.5)
WBC: 4.3 10*3/uL (ref 4.0–10.5)

## 2024-01-29 LAB — HEMOGLOBIN A1C: Hgb A1c MFr Bld: 6.8 % — ABNORMAL HIGH (ref 4.6–6.5)

## 2024-01-29 NOTE — Addendum Note (Signed)
 Addended by: Lindle Rhea on: 01/29/2024 10:10 AM   Modules accepted: Orders

## 2024-01-29 NOTE — Addendum Note (Signed)
 Addended by: Lindle Rhea on: 01/29/2024 10:11 AM   Modules accepted: Orders

## 2024-01-30 ENCOUNTER — Encounter: Payer: Self-pay | Admitting: Internal Medicine

## 2024-01-30 DIAGNOSIS — Z1211 Encounter for screening for malignant neoplasm of colon: Secondary | ICD-10-CM

## 2024-01-30 LAB — BASIC METABOLIC PANEL WITH GFR
BUN: 16 mg/dL (ref 7–25)
CO2: 25 mmol/L (ref 20–32)
Calcium: 10.3 mg/dL (ref 8.6–10.4)
Chloride: 107 mmol/L (ref 98–110)
Creat: 0.84 mg/dL (ref 0.50–1.05)
Glucose, Bld: 108 mg/dL — ABNORMAL HIGH (ref 65–99)
Potassium: 4.4 mmol/L (ref 3.5–5.3)
Sodium: 142 mmol/L (ref 135–146)
eGFR: 79 mL/min/{1.73_m2} (ref >=60)

## 2024-01-30 LAB — IRON,TIBC AND FERRITIN PANEL
%SAT: 32 % (ref 16–45)
Ferritin: 291 ng/mL — ABNORMAL HIGH (ref 16–288)
Iron: 85 ug/dL (ref 45–160)
TIBC: 268 ug/dL (ref 250–450)

## 2024-01-30 LAB — HEPATIC FUNCTION PANEL
AG Ratio: 1.4 (calc) (ref 1.0–2.5)
ALT: 16 U/L (ref 6–29)
AST: 12 U/L (ref 10–35)
Albumin: 3.9 g/dL (ref 3.6–5.1)
Alkaline phosphatase (APISO): 110 U/L (ref 37–153)
Bilirubin, Direct: 0.1 mg/dL (ref 0.0–0.2)
Globulin: 2.8 g/dL (ref 1.9–3.7)
Indirect Bilirubin: 0.3 mg/dL (ref 0.2–1.2)
Total Bilirubin: 0.4 mg/dL (ref 0.2–1.2)
Total Protein: 6.7 g/dL (ref 6.1–8.1)

## 2024-01-30 LAB — LIPID PANEL
Cholesterol: 169 mg/dL (ref <200)
HDL: 50 mg/dL (ref >=50)
LDL Cholesterol (Calc): 88 mg/dL
Non-HDL Cholesterol (Calc): 119 mg/dL (ref <130)
Total CHOL/HDL Ratio: 3.4 (calc) (ref <5.0)
Triglycerides: 212 mg/dL — ABNORMAL HIGH (ref <150)

## 2024-01-30 NOTE — Telephone Encounter (Signed)
 She was scheduled for a colonoscopy with Dr Antony Baumgartner so we will need a new referral for [facility], correct?

## 2024-01-30 NOTE — Telephone Encounter (Signed)
 yes

## 2024-02-12 ENCOUNTER — Other Ambulatory Visit: Payer: Self-pay

## 2024-02-12 ENCOUNTER — Telehealth: Payer: Self-pay

## 2024-02-12 DIAGNOSIS — Z1211 Encounter for screening for malignant neoplasm of colon: Secondary | ICD-10-CM

## 2024-02-12 MED ORDER — NA SULFATE-K SULFATE-MG SULF 17.5-3.13-1.6 GM/177ML PO SOLN: 1.0000 | Freq: Once | ORAL | 0 refills | Status: AC

## 2024-02-12 NOTE — Telephone Encounter (Signed)
 Gastroenterology Pre-Procedure Review  Request Date: 02/21/24 Requesting Physician: Dr. Marinda  PATIENT REVIEW QUESTIONS: The patient responded to the following health history questions as indicated:    1. Are you having any GI issues? no 2. Do you have a personal history of Polyps? no 3. Do you have a family history of Colon Cancer or Polyps? no 4. Diabetes Mellitus? Yes controlled with diet. 5. Joint replacements in the past 12 months?no 6. Major health problems in the past 3 months?no 7. Any artificial heart valves, MVP, or defibrillator?no    MEDICATIONS & ALLERGIES:    Patient reports the following regarding taking any anticoagulation/antiplatelet therapy:   Plavix, Coumadin, Eliquis, Xarelto, Lovenox , Pradaxa, Brilinta, or Effient? no Aspirin ? Yes 81 mg daily  Patient confirms/reports the following medications:  Current Outpatient Medications  Medication Sig Dispense Refill   amLODipine  (NORVASC ) 5 MG tablet TAKE 1 TABLET BY MOUTH TWICE A DAY 180 tablet 3   amoxicillin -clavulanate (AUGMENTIN ) 875-125 MG tablet Take 1 tablet by mouth 2 (two) times daily. 10 tablet 0   aspirin  81 MG tablet Take 81 mg by mouth daily.     carvedilol  (COREG ) 12.5 MG tablet Take 1 tablet (12.5 mg total) by mouth 2 (two) times daily with a meal. 180 tablet 1   ferrous sulfate 325 (65 FE) MG tablet Take 325 mg by mouth daily with breakfast.     fexofenadine  (ALLEGRA  ALLERGY) 60 MG tablet Take 1 tablet (60 mg total) by mouth 2 (two) times daily. 30 tablet 0   glucose blood (ONETOUCH VERIO) test strip CHECK BLOOD SUGAR TWICE A DAY 3 TO 4 TIMES A WEEK 100 strip 12   guaiFENesin  (MUCINEX ) 600 MG 12 hr tablet Take 1 tablet (600 mg total) by mouth 2 (two) times daily. 30 tablet 0   Lancets (ONETOUCH ULTRASOFT) lancets Use as instructed to check Blood sugars 3-4 times a week, twice a day.  Dispense One Touch brand. E11.9 100 each 12   losartan  (COZAAR ) 100 MG tablet Take 1 tablet (100 mg total) by mouth daily.  90 tablet 1   Magnesium  250 MG TABS Take 250 mg by mouth at bedtime.     ondansetron  (ZOFRAN ) 4 MG tablet Take 1 tablet (4 mg total) by mouth every 8 (eight) hours as needed for nausea or vomiting. 15 tablet 0   pantoprazole  (PROTONIX ) 40 MG tablet Take 1 tablet (40 mg total) by mouth daily before breakfast. 90 tablet 1   predniSONE  (DELTASONE ) 20 MG tablet Take 2 tablets (40 mg total) by mouth daily with breakfast. 10 tablet 0   rosuvastatin  (CRESTOR ) 40 MG tablet TAKE 1 TABLET BY MOUTH EVERY DAY 90 tablet 3   solifenacin  (VESICARE ) 5 MG tablet Take 1 tablet (5 mg total) by mouth daily. 90 tablet 1   vitamin B-12 (CYANOCOBALAMIN) 1000 MCG tablet Take 1,000 mcg by mouth daily.     Vitamin D3 (VITAMIN D ) 25 MCG tablet Take 1,000 Units by mouth daily.     No current facility-administered medications for this visit.    Patient confirms/reports the following allergies:  Allergies  Allergen Reactions   Codeine Palpitations    dizzy    No orders of the defined types were placed in this encounter.   AUTHORIZATION INFORMATION Primary Insurance: 1D#: Group #:  Secondary Insurance: 1D#: Group #:  SCHEDULE INFORMATION: Date: 02/21/24 Time: Location: [facility]

## 2024-02-12 NOTE — Telephone Encounter (Signed)
 Instructions corrected for incorrect date.  Thanks, [place], CMA

## 2024-02-12 NOTE — Telephone Encounter (Signed)
 Pt requesting call back to schedule colonoscopy.

## 2024-02-14 ENCOUNTER — Other Ambulatory Visit: Payer: Self-pay | Admitting: Medical Genetics

## 2024-02-15 ENCOUNTER — Other Ambulatory Visit
Admission: RE | Admit: 2024-02-15 | Discharge: 2024-02-15 | Disposition: A | Discharge status: Home / Self Care | Payer: Self-pay | Source: Ambulatory Visit | Attending: Oncology | Admitting: Oncology

## 2024-02-21 ENCOUNTER — Other Ambulatory Visit: Payer: Self-pay

## 2024-02-21 ENCOUNTER — Ambulatory Visit
Admission: RE | Admit: 2024-02-21 | Discharge: 2024-02-21 | Disposition: A | Discharge status: Home / Self Care | Source: Ambulatory Visit | Attending: General Surgery | Admitting: General Surgery

## 2024-02-21 ENCOUNTER — Ambulatory Visit: Admitting: Certified Registered Nurse Anesthetist

## 2024-02-21 ENCOUNTER — Encounter
Admission: RE | Disposition: A | Discharge status: Home / Self Care | Payer: Self-pay | Source: Ambulatory Visit | Attending: General Surgery

## 2024-02-21 ENCOUNTER — Encounter: Payer: Self-pay | Admitting: General Surgery

## 2024-02-21 DIAGNOSIS — K5730 Diverticulosis of large intestine without perforation or abscess without bleeding: Secondary | ICD-10-CM | POA: Diagnosis not present

## 2024-02-21 DIAGNOSIS — E119 Type 2 diabetes mellitus without complications: Secondary | ICD-10-CM | POA: Insufficient documentation

## 2024-02-21 DIAGNOSIS — I10 Essential (primary) hypertension: Secondary | ICD-10-CM | POA: Insufficient documentation

## 2024-02-21 DIAGNOSIS — K649 Unspecified hemorrhoids: Secondary | ICD-10-CM | POA: Diagnosis not present

## 2024-02-21 DIAGNOSIS — Z1211 Encounter for screening for malignant neoplasm of colon: Secondary | ICD-10-CM | POA: Insufficient documentation

## 2024-02-21 DIAGNOSIS — K219 Gastro-esophageal reflux disease without esophagitis: Secondary | ICD-10-CM | POA: Insufficient documentation

## 2024-02-21 DIAGNOSIS — Z79899 Other long term (current) drug therapy: Secondary | ICD-10-CM | POA: Insufficient documentation

## 2024-02-21 HISTORY — PX: COLONOSCOPY: SHX5424

## 2024-02-21 LAB — GLUCOSE, CAPILLARY: Glucose-Capillary: 131 mg/dL — ABNORMAL HIGH (ref 70–99)

## 2024-02-21 SURGERY — COLONOSCOPY
Anesthesia: General

## 2024-02-21 MED ORDER — PROPOFOL 1000 MG/100ML IV EMUL
INTRAVENOUS | Status: AC
  Filled 2024-02-21: qty 100

## 2024-02-21 MED ORDER — PROPOFOL 10 MG/ML IV BOLUS
INTRAVENOUS | Status: DC | PRN
  Administered 2024-02-21: 40 mg via INTRAVENOUS
  Administered 2024-02-21: 30 mg via INTRAVENOUS

## 2024-02-21 MED ORDER — PROPOFOL 500 MG/50ML IV EMUL
INTRAVENOUS | Status: DC | PRN
  Administered 2024-02-21: 80 ug/kg/min via INTRAVENOUS

## 2024-02-21 MED ORDER — LIDOCAINE HCL (CARDIAC) PF 100 MG/5ML IV SOSY
PREFILLED_SYRINGE | INTRAVENOUS | Status: DC | PRN
Start: 2024-02-21 — End: 2024-02-21
  Administered 2024-02-21: 80 mg via INTRAVENOUS

## 2024-02-21 MED ORDER — SODIUM CHLORIDE 0.9 % IV SOLN: INTRAVENOUS | Status: DC

## 2024-02-21 NOTE — Transfer of Care (Signed)
 Immediate Anesthesia Transfer of Care Note  Patient: Deborah Bartlett  Procedure(s) Performed: COLONOSCOPY  Patient Location: PACU  Anesthesia Type:General  Level of Consciousness: drowsy and patient cooperative  Airway & Oxygen Therapy: Patient Spontanous Breathing  Post-op Assessment: Report given to RN and Post -op Vital signs reviewed and stable  Post vital signs: Reviewed and stable  Last Vitals:  Vitals Value Taken Time  BP 114/70 02/21/24 09:47  Temp 35.6 C 02/21/24 09:47  Pulse 71 02/21/24 09:48  Resp 17 02/21/24 09:48  SpO2 95 % 02/21/24 09:48  Vitals shown include unfiled device data.  Last Pain:  Vitals:   02/21/24 0947  TempSrc: Temporal  PainSc: 0-No pain         Complications: No notable events documented.

## 2024-02-21 NOTE — Op Note (Signed)
 [facility] Gastroenterology Patient Name: Deborah Bartlett Procedure Date: 02/21/2024 9:03 AM MRN: [id number] Account #: [id number] Date of Birth: 01-19-1961 Admit Type: Outpatient Age: 63 Room: [facility] ENDO ROOM 1 Gender: Female Note Status: Finalized Instrument Name: Colonoscope [id number] Procedure:             Colonoscopy Indications:           Screening for colorectal malignant neoplasm Providers:             Jayson KIDD. Marinda, MD Referring MD:          Allena Hamilton, MD (Referring MD) Medicines:             See the Anesthesia note for documentation of the                         administered medications Complications:         No immediate complications. Estimated blood loss: None. Procedure:             Pre-Anesthesia Assessment:                        - Prior to the procedure, a History and Physical was                         performed, and patient medications and allergies were                         reviewed. The patient's tolerance of previous                         anesthesia was also reviewed. The risks and benefits                         of the procedure and the sedation options and risks                         were discussed with the patient. All questions were                         answered, and informed consent was obtained. Prior                         Anticoagulants: The patient has taken no anticoagulant                         or antiplatelet agents. ASA Grade Assessment: II - A                         patient with mild systemic disease. After reviewing                         the risks and benefits, the patient was deemed in                         satisfactory condition to undergo the procedure.                        After obtaining informed consent, the colonoscope was  passed under direct vision. Throughout the procedure,                         the patient's blood pressure, pulse, and oxygen                          saturations were monitored continuously. The                         Colonoscope was introduced through the anus and                         advanced to the the cecum, identified by appendiceal                         orifice and ileocecal valve. The colonoscopy was                         performed with ease. The patient tolerated the                         procedure well. The quality of the bowel preparation                         was excellent. Findings:      Hemorrhoids were found on perianal exam.      The entire examined colon appeared normal on direct and retroflexion       views. Impression:            - Hemorrhoids found on perianal exam.                        - The entire examined colon is normal on direct and                         retroflexion views.                        - No specimens collected.                        - Mild diverticulosis in the distal sigmoid colon.                         There was no evidence of diverticular bleeding. Recommendation:        - Discharge patient to home (ambulatory).                        - Repeat colonoscopy in 10 years for screening                         purposes. Procedure Code(s):     --- Professional ---                        [phone], Colonoscopy, flexible; diagnostic, including                         collection of specimen(s) by brushing or washing, when  performed (separate procedure) CPT copyright 2022 American [employer] Association. All rights reserved. The codes documented in this report are preliminary and upon coder review may  be revised to meet current compliance requirements. Jayson MALVA Endow, MD 02/21/2024 9:47:10 AM Number of Addenda: 0 Note Initiated On: 02/21/2024 9:03 AM Scope Withdrawal Time: 0 hours 10 minutes 36 seconds  Total Procedure Duration: 0 hours 21 minutes 54 seconds       [facility]

## 2024-02-21 NOTE — Anesthesia Preprocedure Evaluation (Signed)
 Anesthesia Evaluation  Patient identified by MRN, date of birth, ID band Patient awake    Reviewed: Allergy & Precautions, H&P , NPO status , Patient's Chart, lab work & pertinent test results  Airway Mallampati: II  TM Distance: >3 FB Neck ROM: full    Dental no notable dental hx.    Pulmonary sleep apnea    Pulmonary exam normal        Cardiovascular hypertension, Pt. on home beta blockers and Pt. on medications Normal cardiovascular exam     Neuro/Psych negative neurological ROS  negative psych ROS   GI/Hepatic Neg liver ROS,GERD  ,,  Endo/Other  diabetes    Renal/GU negative Renal ROS  negative genitourinary   Musculoskeletal   Abdominal   Peds  Hematology negative hematology ROS (+)   Anesthesia Other Findings Past Medical History: 09/23/2016: Cellulitis of suprapubic region No date: Diabetes mellitus without complication (HCC) No date: GERD (gastroesophageal reflux disease) No date: Hypercholesteremia No date: Hypertension No date: Migraine     Comment:  irregular, rare 2014: Perimenopausal  Past Surgical History: Sept 2007: CARDIAC SURGERY     Comment:  birth defect/Scimitar Syndrome 1990, 1993, 1999: CESAREAN SECTION 2014: COLONOSCOPY     Comment:  Dr Dessa 05/26/2020: ORIF ELBOW FRACTURE; Left     Comment:  Procedure: OPEN REDUCTION INTERNAL FIXATION (ORIF)               ELBOW/OLECRANON FRACTURE;  Surgeon: Edie Norleen PARAS, MD;                Location: [facility] ORS;  Service: Orthopedics;  Laterality:               Left; 02/21/2018: SHOULDER ARTHROSCOPY WITH ROTATOR CUFF REPAIR; Right     Comment:  Procedure: SHOULDER ARTHROSCOPY WITH DEBRIDEMENT               DECOMPRESSION AND REPAIR OF ROTATOR CUFF TEAR;  Surgeon:               Edie Norleen PARAS, MD;  Location: [facility];                Service: Orthopedics;  Laterality: Right;  Diabetic -               diet controlled      Reproductive/Obstetrics negative OB ROS                              Anesthesia Physical Anesthesia Plan  ASA: 2  Anesthesia Plan: General   Post-op Pain Management:    Induction: Intravenous  PONV Risk Score and Plan: Propofol  infusion and TIVA  Airway Management Planned: Natural Airway  Additional Equipment:   Intra-op Plan:   Post-operative Plan:   Informed Consent:      Dental Advisory Given  Plan Discussed with: CRNA and Surgeon  Anesthesia Plan Comments:          Anesthesia Quick Evaluation

## 2024-02-21 NOTE — Discharge Instructions (Signed)
YOU HAD AN ENDOSCOPIC PROCEDURE TODAY: Refer to the procedure report that was given to you for any specific questions about what was found during the examination.  If the procedure report does not answer your questions, please call your gastroenterologist to clarify. ° °YOU SHOULD EXPECT: Some feelings of bloating in the abdomen. Passage of more gas than usual.  Walking can help get rid of the air that was put into your GI tract during the procedure and reduce the bloating. If you had a lower endoscopy (such as a colonoscopy or flexible sigmoidoscopy) you may notice spotting of blood in your stool or on the toilet paper.  ° °DIET: Your first meal following the procedure should be a light meal and then it is ok to progress to your normal diet.  A half-sandwich or bowl of soup is an example of a good first meal.  Heavy or fried foods are harder to digest and may make you feel nasueas or bloated.  Drink plenty of fluids but you should avoid alcoholic beverages for 24 hours. ° °ACTIVITY: Your care partner should take you home directly after the procedure.  You should plan to take it easy, moving slowly for the rest of the day.  You can resume normal activity the day after the procedure however you should NOT DRIVE, make legal decisions or use heavy machinery for 24 hours (because of the sedation medicines used during the test).   ° °SYMPTOMS TO REPORT IMMEDIATELY  °A gastroenterologist can be reached at any hour.  Please call your doctor's office for any of the following symptoms: ° °· Following lower endoscopy (colonoscopy, flexible sigmoidoscopy) ° Excessive amounts of blood in the stool ° Significant tenderness, worsening of abdominal pains ° Swelling of the abdomen that is new, acute ° Fever of 100° or higher °· Following upper endoscopy (EGD, EUS, ERCP) ° Vomiting of blood or coffee ground material ° New, significant abdominal pain ° New, significant chest pain or pain under the shoulder blades ° Painful or  persistently difficult swallowing ° New shortness of breath ° Black, tarry-looking stools ° °FOLLOW UP: °If any biopsies were taken you will be contacted by phone or by letter within the next 1-3 weeks.  Call your gastroenterologist if you have not heard about the biopsies in 3 weeks.  ° °Please also call your gastroenterologist's office with any specific questions about appointments or follow up tests. °

## 2024-02-21 NOTE — Anesthesia Postprocedure Evaluation (Signed)
 Anesthesia Post Note  Patient: Deborah Bartlett  Procedure(s) Performed: COLONOSCOPY  Patient location during evaluation: Endoscopy Anesthesia Type: General Level of consciousness: awake and alert Pain management: pain level controlled Vital Signs Assessment: post-procedure vital signs reviewed and stable Respiratory status: spontaneous breathing, nonlabored ventilation and respiratory function stable Cardiovascular status: blood pressure returned to baseline and stable Postop Assessment: no apparent nausea or vomiting Anesthetic complications: no   No notable events documented.   Last Vitals:  Vitals:   02/21/24 0957 02/21/24 1007  BP: 133/77 (!) 111/100  Pulse: 66 68  Resp: (!) 22 18  Temp:    SpO2: 98% 99%    Last Pain:  Vitals:   02/21/24 1007  TempSrc:   PainSc: 0-No pain                 Camellia Merilee Louder

## 2024-02-21 NOTE — H&P (Signed)
 Primary Care Physician:  Glendia Shad, MD Primary Gastroenterologist:  Dr. Marinda  Pre-Procedure History & Physical: HPI:  Deborah Bartlett is a 63 y.o. female is here for an colonoscopy.   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   CESAREAN SECTION  1990, 1993, 1999   COLONOSCOPY  2014   Dr Dessa   ORIF ELBOW FRACTURE Left 05/26/2020   Procedure: OPEN REDUCTION INTERNAL FIXATION (ORIF) ELBOW/OLECRANON FRACTURE;  Surgeon: Edie Norleen PARAS, MD;  Location: [facility];  Service: Orthopedics;  Laterality: Left;   SHOULDER ARTHROSCOPY WITH ROTATOR CUFF REPAIR Right 02/21/2018   Procedure: SHOULDER ARTHROSCOPY WITH DEBRIDEMENT DECOMPRESSION AND REPAIR OF ROTATOR CUFF TEAR;  Surgeon: Edie Norleen PARAS, MD;  Location: [facility];  Service: Orthopedics;  Laterality: Right;  Diabetic - diet controlled    Prior to Admission medications   Medication Sig Start Date End Date Taking? Authorizing Provider  amLODipine  (NORVASC ) 5 MG tablet TAKE 1 TABLET BY MOUTH TWICE A DAY 10/31/23  Yes Glendia Shad, MD  aspirin  81 MG tablet Take 81 mg by mouth daily.   Yes [provider]  carvedilol  (COREG ) 12.5 MG tablet Take 1 tablet (12.5 mg total) by mouth 2 (two) times daily with a meal. 11/23/23  Yes Glendia Shad, MD  ferrous sulfate 325 (65 FE) MG tablet Take 325 mg by mouth daily with breakfast.   Yes [provider]  fexofenadine  (ALLEGRA  ALLERGY) 60 MG tablet Take 1 tablet (60 mg total) by mouth 2 (two) times daily. 08/11/23  Yes Kaur, Charanpreet, NP  losartan  (COZAAR ) 100 MG tablet Take 1 tablet (100 mg total) by mouth daily. 11/23/23  Yes Glendia Shad, MD  Magnesium  250 MG TABS Take 250 mg by mouth at bedtime.   Yes [provider]  pantoprazole  (PROTONIX ) 40 MG tablet Take 1 tablet (40 mg total) by mouth daily before breakfast. 11/23/23  Yes Glendia Shad, MD  rosuvastatin  (CRESTOR ) 40 MG tablet TAKE 1 TABLET BY MOUTH EVERY DAY 08/30/23  Yes Glendia Shad, MD  solifenacin  (VESICARE ) 5 MG tablet Take 1 tablet (5 mg total) by mouth daily. 11/23/23  Yes Glendia Shad, MD  vitamin B-12 (CYANOCOBALAMIN) 1000 MCG tablet Take 1,000 mcg by mouth daily.   Yes [provider]  Vitamin D3 (VITAMIN D ) 25 MCG tablet Take 1,000 Units by mouth daily.   Yes [provider]  amoxicillin -clavulanate (AUGMENTIN ) 875-125 MG tablet Take 1 tablet by mouth 2 (two) times daily. Patient not taking: Reported on 02/21/2024 01/08/24   Narendra, Nischal, MD  glucose blood (ONETOUCH VERIO) test strip CHECK BLOOD SUGAR TWICE A DAY 3 TO 4 TIMES A WEEK 10/06/23   Glendia Shad, MD  guaiFENesin  (MUCINEX ) 600 MG 12 hr tablet Take 1 tablet (600 mg total) by mouth 2 (two) times daily. Patient not taking: Reported on 02/21/2024 08/11/23   Vincente Saber, NP  Lancets [facility] ULTRASOFT) lancets Use as instructed to check Blood sugars 3-4 times a week, twice a day.  Dispense One Touch brand. E11.9 02/17/16   Glendia Shad, MD  ondansetron  (ZOFRAN ) 4 MG tablet Take 1 tablet (4 mg total) by mouth  every 8 (eight) hours as needed for nausea or vomiting. 06/20/23   Marylynn Verneita CROME, MD  predniSONE  (DELTASONE ) 20 MG tablet Take 2 tablets (40 mg total) by mouth daily with breakfast. Patient not taking: Reported on 02/21/2024 01/08/24   Onesimo Claude, MD    Allergies as of 02/12/2024 - Review Complete 01/08/2024  Allergen Reaction Noted   Codeine Palpitations 03/20/2013    Family History  Problem Relation Age of Onset   Hypertension Mother    Diabetes Mother    Diabetes Maternal Grandmother    Diabetes Maternal Grandfather    Breast cancer Neg  Hx    Colon cancer Neg Hx     Social History   Socioeconomic History   Marital status: Married    Spouse name: Not on file   Number of children: Not on file   Years of education: Not on file   Highest education level: 12th grade  Occupational History   Not on file  Tobacco Use   Smoking status: Never   Smokeless tobacco: Never  Vaping Use   Vaping status: Never Used  Substance and Sexual Activity   Alcohol use: No    Alcohol/week: 0.0 standard drinks of alcohol   Drug use: No   Sexual activity: Not on file  Other Topics Concern   Not on file  Social History Narrative   Not on file   Social Drivers of Health   Financial Resource Strain: Low Risk  (03/01/2023)   Overall Financial Resource Strain (CARDIA)    Difficulty of Paying Living Expenses: Not hard at all  Food Insecurity: Unknown (06/26/2023)   Hunger Vital Sign    Worried About Running Out of Food in the Last Year: Never true    Ran Out of Food in the Last Year: Patient declined  Transportation Needs: No Transportation Needs (06/26/2023)   PRAPARE - Administrator, Civil Service (Medical): No    Lack of Transportation (Non-Medical): No  Physical Activity: Insufficiently Active (03/01/2023)   Exercise Vital Sign    Days of Exercise per Week: 2 days    Minutes of Exercise per Session: 20 min  Stress: No Stress Concern Present (03/01/2023)   [employer] of Occupational Health - Occupational Stress Questionnaire    Feeling of Stress : Only a little  Social Connections: Socially Integrated (03/01/2023)   Social Connection and Isolation Panel    Frequency of Communication with Friends and Family: More than three times a week    Frequency of Social Gatherings with Friends and Family: Three times a week    Attends Religious Services: More than 4 times per year    Active Member of Clubs or Organizations: Yes    Attends Banker Meetings: 1 to 4 times per year    Marital Status: Married   Catering manager Violence: Not At Risk (06/26/2023)   Humiliation, Afraid, Rape, and Kick questionnaire    Fear of Current or Ex-Partner: No    Emotionally Abused: No    Physically Abused: No    Sexually Abused: No    Review of Systems: See HPI, otherwise negative ROS  Physical Exam: BP (!) 152/88   Pulse 78   Temp 98.2 F (36.8 C) (Temporal)   Resp 18   Ht 5' 1 (1.549 m)   Wt 69.2 kg   LMP 02/13/2013   SpO2 98%   BMI 28.83 kg/m  General:   Alert,  pleasant and cooperative in NAD Head:  Normocephalic and  atraumatic. Neck:  Supple; no masses or thyromegaly. Lungs:  Clear throughout to auscultation.    Heart:  Regular rate and rhythm. Abdomen:  Soft, nontender and nondistended. Normal bowel sounds, without guarding, and without rebound.   Neurologic:  Alert and  oriented x4;  grossly normal neurologically.  Impression/Plan: Deborah Bartlett is here for an colonoscopy to be performed for screening  Risks, benefits, limitations, and alternatives regarding  colonoscopy have been reviewed with the patient.  Questions have been answered.  All parties agreeable.   Jayson MALVA Endow, MD  02/21/2024, 9:13 AM

## 2024-02-25 LAB — GENECONNECT MOLECULAR SCREEN: Genetic Analysis Overall Interpretation: NEGATIVE

## 2024-03-13 ENCOUNTER — Encounter: Payer: Self-pay | Admitting: General Surgery

## 2024-03-13 LAB — HM DIABETES EYE EXAM

## 2024-04-02 ENCOUNTER — Ambulatory Visit: Admitting: Internal Medicine

## 2024-04-02 ENCOUNTER — Encounter: Payer: Self-pay | Admitting: Internal Medicine

## 2024-04-02 VITALS — BP 122/70 | HR 79 | Temp 98.3°F | Ht 61.0 in | Wt 158.8 lb

## 2024-04-02 DIAGNOSIS — E279 Disorder of adrenal gland, unspecified: Secondary | ICD-10-CM

## 2024-04-02 DIAGNOSIS — I10 Essential (primary) hypertension: Secondary | ICD-10-CM | POA: Diagnosis not present

## 2024-04-02 DIAGNOSIS — M7989 Other specified soft tissue disorders: Secondary | ICD-10-CM

## 2024-04-02 DIAGNOSIS — E7800 Pure hypercholesterolemia, unspecified: Secondary | ICD-10-CM

## 2024-04-02 DIAGNOSIS — I890 Lymphedema, not elsewhere classified: Secondary | ICD-10-CM

## 2024-04-02 DIAGNOSIS — E1165 Type 2 diabetes mellitus with hyperglycemia: Secondary | ICD-10-CM

## 2024-04-02 DIAGNOSIS — G4730 Sleep apnea, unspecified: Secondary | ICD-10-CM

## 2024-04-02 DIAGNOSIS — K219 Gastro-esophageal reflux disease without esophagitis: Secondary | ICD-10-CM

## 2024-04-02 NOTE — Progress Notes (Signed)
 Subjective:    Patient ID: Deborah Bartlett, female    DOB: [birth year], 62 y.o.   MRN: [id number]  Patient here for  Chief Complaint  Patient presents with   Medical Management of Chronic Issues    4 month f/u    HPI Here for a scheduled follow up - follow up regarding increased lower extremity swelling, hypercholesterolemia and hypertension. Had f/u with [facility] 10/30/23 - f/u leg swelling. Wearing compression hose. Also has been using her lymphedema pump daily. Continues cpap. Continues on coreg , losartan  and amlodipine . Recently went to the beach. Did not wear the compression hose on the beach. Some pedal swelling. Overall has been stable. No chest pain or sob reported. No abdominal pain or bowel change reported.    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   CESAREAN SECTION  1990, 1993, 1999   COLONOSCOPY  2014   Dr Dessa   COLONOSCOPY N/A 02/21/2024   Procedure: COLONOSCOPY;  Surgeon: Marinda Jayson KIDD, MD;  Location: [facility];  Service: General;  Laterality: N/A;   ORIF ELBOW FRACTURE Left 05/26/2020   Procedure: OPEN REDUCTION INTERNAL FIXATION (ORIF) ELBOW/OLECRANON FRACTURE;  Surgeon: Edie Norleen PARAS, MD;  Location: [facility];  Service: Orthopedics;  Laterality: Left;   SHOULDER ARTHROSCOPY WITH ROTATOR CUFF REPAIR Right 02/21/2018   Procedure: SHOULDER ARTHROSCOPY WITH DEBRIDEMENT DECOMPRESSION AND REPAIR OF ROTATOR CUFF TEAR;  Surgeon: Edie Norleen PARAS, MD;  Location: [facility];  Service: Orthopedics;  Laterality: Right;  Diabetic - diet controlled   Family History  Problem Relation Age of Onset   Hypertension Mother    Diabetes  Mother    Diabetes Maternal Grandmother    Diabetes Maternal Grandfather    Breast cancer Neg Hx    Colon cancer Neg Hx    Social History   Socioeconomic History   Marital status: Married    Spouse name: Not on file   Number of children: Not on file   Years of education: Not on file   Highest education level: 12th grade  Occupational History   Not on file  Tobacco Use   Smoking status: Never   Smokeless tobacco: Never  Vaping Use   Vaping status: Never Used  Substance and Sexual Activity   Alcohol use: No    Alcohol/week: 0.0 standard drinks of alcohol   Drug use: No   Sexual activity: Not on file  Other Topics Concern   Not on file  Social History Narrative   Not on file   Social Drivers of Health   Financial Resource Strain: Low Risk  (03/01/2023)   Overall Financial Resource Strain (CARDIA)    Difficulty of Paying Living Expenses: Not hard at all  Food Insecurity: Unknown (06/26/2023)   Hunger Vital Sign    Worried About Running Out of Food in the Last Year: Never true    Ran Out of Food in the Last Year: Patient declined  Transportation Needs: No Transportation Needs (06/26/2023)   PRAPARE - Administrator, Civil Service (Medical): No    Lack of Transportation (Non-Medical): No  Physical Activity: Insufficiently Active (03/01/2023)  Exercise Vital Sign    Days of Exercise per Week: 2 days    Minutes of Exercise per Session: 20 min  Stress: No Stress Concern Present (03/01/2023)   [employer] of Occupational Health - Occupational Stress Questionnaire    Feeling of Stress : Only a little  Social Connections: Socially Integrated (03/01/2023)   Social Connection and Isolation Panel    Frequency of Communication with Friends and Family: More than three times a week    Frequency of Social Gatherings with Friends and Family: Three times a week    Attends Religious Services: More than 4 times per year    Active Member of Clubs or Organizations: Yes     Attends Banker Meetings: 1 to 4 times per year    Marital Status: Married     Review of Systems  Constitutional:  Negative for appetite change and unexpected weight change.  HENT:  Negative for congestion and sinus pressure.   Respiratory:  Negative for cough, chest tightness and shortness of breath.   Cardiovascular:  Negative for chest pain and palpitations.       Pedal and lower extremity swelling as outlined.   Gastrointestinal:  Negative for abdominal pain, diarrhea, nausea and vomiting.  Genitourinary:  Negative for difficulty urinating and dysuria.  Musculoskeletal:  Negative for joint swelling and myalgias.  Skin:  Negative for color change and rash.  Neurological:  Negative for dizziness and headaches.  Psychiatric/Behavioral:  Negative for agitation and dysphoric mood.        Objective:     BP 122/70 (BP Location: Left Arm, Patient Position: Sitting, Cuff Size: Normal)   Pulse 79   Temp 98.3 F (36.8 C) (Oral)   Ht 5' 1 (1.549 m)   Wt 158 lb 12.8 oz (72 kg)   LMP 02/13/2013   SpO2 95%   BMI 30.00 kg/m  Wt Readings from Last 3 Encounters:  04/02/24 158 lb 12.8 oz (72 kg)  02/21/24 152 lb 9.6 oz (69.2 kg)  01/08/24 158 lb 12.8 oz (72 kg)    Physical Exam Vitals reviewed.  Constitutional:      General: She is not in acute distress.    Appearance: Normal appearance.  HENT:     Head: Normocephalic and atraumatic.     Right Ear: External ear normal.     Left Ear: External ear normal.     Mouth/Throat:     Pharynx: No oropharyngeal exudate or posterior oropharyngeal erythema.  Eyes:     General: No scleral icterus.       Right eye: No discharge.        Left eye: No discharge.     Conjunctiva/sclera: Conjunctivae normal.  Neck:     Thyroid : No thyromegaly.  Cardiovascular:     Rate and Rhythm: Normal rate and regular rhythm.  Pulmonary:     Effort: No respiratory distress.     Breath sounds: Normal breath sounds. No wheezing.  Abdominal:      General: Bowel sounds are normal.     Palpations: Abdomen is soft.     Tenderness: There is no abdominal tenderness.  Musculoskeletal:     Cervical back: Neck supple. No tenderness.     Comments: Pedal and lower extremity swelling - no increased erythema.   Lymphadenopathy:     Cervical: No cervical adenopathy.  Skin:    Findings: No erythema or rash.  Neurological:     Mental Status: She is alert.  Psychiatric:  Mood and Affect: Mood normal.        Behavior: Behavior normal.         Outpatient Encounter Medications as of 04/02/2024  Medication Sig   amLODipine  (NORVASC ) 5 MG tablet TAKE 1 TABLET BY MOUTH TWICE A DAY   amoxicillin -clavulanate (AUGMENTIN ) 875-125 MG tablet Take 1 tablet by mouth 2 (two) times daily.   aspirin  81 MG tablet Take 81 mg by mouth daily.   carvedilol  (COREG ) 12.5 MG tablet Take 1 tablet (12.5 mg total) by mouth 2 (two) times daily with a meal.   ferrous sulfate 325 (65 FE) MG tablet Take 325 mg by mouth daily with breakfast.   fexofenadine  (ALLEGRA  ALLERGY) 60 MG tablet Take 1 tablet (60 mg total) by mouth 2 (two) times daily.   glucose blood (ONETOUCH VERIO) test strip CHECK BLOOD SUGAR TWICE A DAY 3 TO 4 TIMES A WEEK   guaiFENesin  (MUCINEX ) 600 MG 12 hr tablet Take 1 tablet (600 mg total) by mouth 2 (two) times daily.   Lancets (ONETOUCH ULTRASOFT) lancets Use as instructed to check Blood sugars 3-4 times a week, twice a day.  Dispense One Touch brand. E11.9   losartan  (COZAAR ) 100 MG tablet Take 1 tablet (100 mg total) by mouth daily.   Magnesium  250 MG TABS Take 250 mg by mouth at bedtime.   pantoprazole  (PROTONIX ) 40 MG tablet Take 1 tablet (40 mg total) by mouth daily before breakfast.   predniSONE  (DELTASONE ) 20 MG tablet Take 2 tablets (40 mg total) by mouth daily with breakfast.   rosuvastatin  (CRESTOR ) 40 MG tablet TAKE 1 TABLET BY MOUTH EVERY DAY   solifenacin  (VESICARE ) 5 MG tablet Take 1 tablet (5 mg total) by mouth daily.    vitamin B-12 (CYANOCOBALAMIN) 1000 MCG tablet Take 1,000 mcg by mouth daily.   Vitamin D3 (VITAMIN D ) 25 MCG tablet Take 1,000 Units by mouth daily.   [DISCONTINUED] ondansetron  (ZOFRAN ) 4 MG tablet Take 1 tablet (4 mg total) by mouth every 8 (eight) hours as needed for nausea or vomiting. (Patient not taking: Reported on 04/02/2024)   No facility-administered encounter medications on file as of 04/02/2024.     Lab Results  Component Value Date   WBC 4.3 01/29/2024   HGB 12.8 01/29/2024   HCT 38.8 01/29/2024   PLT 269.0 01/29/2024   GLUCOSE 108 (H) 01/29/2024   CHOL 169 01/29/2024   TRIG 212 (H) 01/29/2024   HDL 50 01/29/2024   LDLDIRECT 83.0 03/09/2023   LDLCALC 88 01/29/2024   ALT 16 01/29/2024   AST 12 01/29/2024   NA 142 01/29/2024   K 4.4 01/29/2024   CL 107 01/29/2024   CREATININE 0.84 01/29/2024   BUN 16 01/29/2024   CO2 25 01/29/2024   TSH 2.46 08/24/2023   INR 1.0 11/02/2020   HGBA1C 6.8 (H) 01/29/2024       Assessment & Plan:  Swelling of left lower extremity Assessment & Plan: Continue lymphedema pump. Continue compression hose. Leg elevation.    Hypercholesterolemia Assessment & Plan: Low cholesterol diet and exercise. Follow lipid panel.   Orders: -     Lipid panel; Future -     Hepatic function panel; Future  Essential hypertension, benign Assessment & Plan: Continues on Coreg , losartan  and amlodipine . On coreg  12.5mg  bid. Blood pressures as outlined. Follow presures. Follow metabolic panel. No changes today.   Orders: -     Basic metabolic panel with GFR; Future  Type 2 diabetes mellitus with hyperglycemia, without long-term  current use of insulin (HCC) Assessment & Plan: Continue low carb diet and exercise. Follow met b and A1c.   Orders: -     Hemoglobin A1c; Future  Sleep apnea, unspecified type Assessment & Plan: CPAP.    Lymphedema Assessment & Plan: Continue lymphedema pump and compression hose.    Lesion of adrenal gland  [facility]) Assessment & Plan: Consistent with adrenal adenoma.  Work-up unrevealing.  Seeing Dr MALVATHEORA Laundry. Saw endocrinology 12/07/22 - f/u adrenal adenoma.  Labs - no evidence of adrenal excess.    Gastroesophageal reflux disease, unspecified whether esophagitis present Assessment & Plan: No upper symptoms reported. Continue protonix.       Allena Hamilton, MD

## 2024-04-03 MED ORDER — ACYCLOVIR 5 % EX OINT: TOPICAL_OINTMENT | CUTANEOUS | 0 refills | Status: AC

## 2024-04-03 NOTE — Telephone Encounter (Signed)
 Is this something that was discussed yesterday?

## 2024-04-03 NOTE — Telephone Encounter (Signed)
 Rx sent in for acyclovir  ointment.

## 2024-04-07 ENCOUNTER — Encounter: Payer: Self-pay | Admitting: Internal Medicine

## 2024-04-07 NOTE — Assessment & Plan Note (Signed)
 Continue lymphedema pump. Continue compression hose. Leg elevation.

## 2024-04-07 NOTE — Assessment & Plan Note (Signed)
 Low cholesterol diet and exercise.  Follow lipid panel.

## 2024-04-07 NOTE — Assessment & Plan Note (Signed)
 Continue lymphedema pump and compression hose.

## 2024-04-07 NOTE — Assessment & Plan Note (Signed)
 Continue low carb diet and exercise. Follow met b and A1c.

## 2024-04-07 NOTE — Assessment & Plan Note (Signed)
 No upper symptoms reported.  Continue protonix.

## 2024-04-07 NOTE — Assessment & Plan Note (Signed)
 Continues on Coreg , losartan  and amlodipine . On coreg  12.5mg  bid. Blood pressures as outlined. Follow presures. Follow metabolic panel. No changes today.

## 2024-04-07 NOTE — Assessment & Plan Note (Signed)
 CPAP.

## 2024-04-07 NOTE — Assessment & Plan Note (Signed)
Consistent with adrenal adenoma.  Work-up unrevealing.  Seeing Dr Val EagleElveria Rising. Saw endocrinology 12/07/22 - f/u adrenal adenoma.  Labs - no evidence of adrenal excess.

## 2024-06-13 ENCOUNTER — Other Ambulatory Visit: Payer: Self-pay | Admitting: Internal Medicine

## 2024-06-23 ENCOUNTER — Encounter: Payer: Self-pay | Admitting: Internal Medicine

## 2024-06-24 NOTE — Telephone Encounter (Signed)
 Need form - Sueanne had copies. Will complete ad place in box.

## 2024-06-25 NOTE — Telephone Encounter (Signed)
 Signed and placed in box.

## 2024-06-25 NOTE — Telephone Encounter (Signed)
Form placed in folder for completion.

## 2024-06-26 NOTE — Telephone Encounter (Signed)
 Spouse came in to pick up this afternoon

## 2024-06-28 ENCOUNTER — Other Ambulatory Visit: Payer: Self-pay | Admitting: Internal Medicine

## 2024-07-05 ENCOUNTER — Other Ambulatory Visit (INDEPENDENT_AMBULATORY_CARE_PROVIDER_SITE_OTHER)

## 2024-07-05 DIAGNOSIS — E1165 Type 2 diabetes mellitus with hyperglycemia: Secondary | ICD-10-CM | POA: Diagnosis not present

## 2024-07-05 DIAGNOSIS — E7800 Pure hypercholesterolemia, unspecified: Secondary | ICD-10-CM

## 2024-07-05 DIAGNOSIS — I10 Essential (primary) hypertension: Secondary | ICD-10-CM

## 2024-07-05 LAB — HEPATIC FUNCTION PANEL
ALT: 15 U/L (ref 0–35)
AST: 13 U/L (ref 0–37)
Albumin: 4.2 g/dL (ref 3.5–5.2)
Alkaline Phosphatase: 114 U/L (ref 39–117)
Bilirubin, Direct: 0 mg/dL (ref 0.0–0.3)
Total Bilirubin: 0.5 mg/dL (ref 0.2–1.2)
Total Protein: 6.8 g/dL (ref 6.0–8.3)

## 2024-07-05 LAB — LIPID PANEL
Cholesterol: 142 mg/dL (ref 0–200)
HDL: 47.8 mg/dL (ref >39.00)
LDL Cholesterol: 68 mg/dL (ref 0–99)
NonHDL: 94.55
Total CHOL/HDL Ratio: 3
Triglycerides: 131 mg/dL (ref 0.0–149.0)
VLDL: 26.2 mg/dL (ref 0.0–40.0)

## 2024-07-05 LAB — HEMOGLOBIN A1C: Hgb A1c MFr Bld: 6.4 % (ref 4.6–6.5)

## 2024-07-05 LAB — BASIC METABOLIC PANEL WITH GFR
BUN: 16 mg/dL (ref 6–23)
CO2: 30 meq/L (ref 19–32)
Calcium: 10.3 mg/dL (ref 8.4–10.5)
Chloride: 106 meq/L (ref 96–112)
Creatinine, Ser: 0.88 mg/dL (ref 0.40–1.20)
GFR: 70.57 mL/min (ref >60.00)
Glucose, Bld: 115 mg/dL — ABNORMAL HIGH (ref 70–99)
Potassium: 3.9 meq/L (ref 3.5–5.1)
Sodium: 141 meq/L (ref 135–145)

## 2024-07-08 ENCOUNTER — Ambulatory Visit: Payer: Self-pay | Admitting: Internal Medicine

## 2024-07-09 ENCOUNTER — Ambulatory Visit: Admitting: Internal Medicine

## 2024-07-09 ENCOUNTER — Encounter: Payer: Self-pay | Admitting: Internal Medicine

## 2024-07-09 VITALS — BP 120/70 | HR 80 | Temp 97.7°F | Ht 59.0 in | Wt 160.5 lb

## 2024-07-09 DIAGNOSIS — Z0000 Encounter for general adult medical examination without abnormal findings: Secondary | ICD-10-CM | POA: Diagnosis not present

## 2024-07-09 DIAGNOSIS — E7800 Pure hypercholesterolemia, unspecified: Secondary | ICD-10-CM

## 2024-07-09 DIAGNOSIS — Z1231 Encounter for screening mammogram for malignant neoplasm of breast: Secondary | ICD-10-CM | POA: Diagnosis not present

## 2024-07-09 DIAGNOSIS — I10 Essential (primary) hypertension: Secondary | ICD-10-CM

## 2024-07-09 DIAGNOSIS — E279 Disorder of adrenal gland, unspecified: Secondary | ICD-10-CM

## 2024-07-09 DIAGNOSIS — E1165 Type 2 diabetes mellitus with hyperglycemia: Secondary | ICD-10-CM

## 2024-07-09 DIAGNOSIS — G4730 Sleep apnea, unspecified: Secondary | ICD-10-CM

## 2024-07-09 DIAGNOSIS — M7989 Other specified soft tissue disorders: Secondary | ICD-10-CM

## 2024-07-09 LAB — HM DIABETES FOOT EXAM

## 2024-07-09 MED ORDER — PANTOPRAZOLE SODIUM 40 MG PO TBEC: 40.0000 mg | DELAYED_RELEASE_TABLET | Freq: Every day | ORAL | 1 refills | Status: AC

## 2024-07-09 MED ORDER — CARVEDILOL 12.5 MG PO TABS: 12.5000 mg | ORAL_TABLET | Freq: Two times a day (BID) | ORAL | 1 refills | Status: AC

## 2024-07-09 MED ORDER — SOLIFENACIN SUCCINATE 5 MG PO TABS
5.0000 mg | ORAL_TABLET | Freq: Every day | ORAL | 1 refills | Status: AC
Start: 2024-07-09 — End: ?

## 2024-07-09 MED ORDER — LOSARTAN POTASSIUM 100 MG PO TABS: 100.0000 mg | ORAL_TABLET | Freq: Every day | ORAL | 1 refills | Status: AC

## 2024-07-09 NOTE — Progress Notes (Signed)
 Subjective:    Patient ID: Deborah Bartlett, female    DOB: 04/07/1961, 63 y.o.   MRN: [id number]  Patient here for  Chief Complaint  Patient presents with   Annual Exam    CPE & review labs    HPI Here for a physical exam. Had f/u with [facility] 10/30/23 - f/u leg swelling. Wearing compression hose. Also has been using her lymphedema pump daily. Continues cpap. Continues on coreg , losartan  and amlodipine . Legs are doing much better. Breathing stable. No increased cough or congestion. No acid reflux. No abdominal pain or bowel change reported. Recently changed jobs. Overall handling this well. Had eyes checked 02/2024.    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   CESAREAN SECTION  1990, 1993, 1999   COLONOSCOPY  2014   Dr Dessa   COLONOSCOPY N/A 02/21/2024   Procedure: COLONOSCOPY;  Surgeon: Marinda Jayson KIDD, MD;  Location: [facility] END[facility]COPY;  Service: General;  Laterality: N/A;   ORIF ELBOW FRACTURE Left 05/26/2020   Procedure: OPEN REDUCTION INTERNAL FIXATION (ORIF) ELBOW/OLECRANON FRACTURE;  Surgeon: Edie Norleen PARAS, MD;  Location: [facility] ORS;  Service: Orthopedics;  Laterality: Left;   SHOULDER ARTHROSCOPY WITH ROTATOR CUFF REPAIR Right 02/21/2018   Procedure: SHOULDER ARTHROSCOPY WITH DEBRIDEMENT DECOMPRESSION AND REPAIR OF ROTATOR CUFF TEAR;  Surgeon: Edie Norleen PARAS, MD;  Location: [facility];  Service: Orthopedics;  Laterality: Right;  Diabetic - diet controlled   Family History  Problem Relation Age of Onset   Hypertension Mother    Diabetes Mother    Diabetes Maternal Grandmother    Diabetes Maternal Grandfather    Breast cancer Neg Hx     Colon cancer Neg Hx    Social History   Socioeconomic History   Marital status: Married    Spouse name: Not on file   Number of children: Not on file   Years of education: Not on file   Highest education level: 12th grade  Occupational History   Not on file  Tobacco Use   Smoking status: Never   Smokeless tobacco: Never  Vaping Use   Vaping status: Never Used  Substance and Sexual Activity   Alcohol use: No    Alcohol/week: 0.0 standard drinks of alcohol   Drug use: No   Sexual activity: Not on file  Other Topics Concern   Not on file  Social History Narrative   Not on file   Social Drivers of Health   Financial Resource Strain: Low Risk  (07/08/2024)   Overall Financial Resource Strain (CARDIA)    Difficulty of Paying Living Expenses: Not hard at all  Food Insecurity: No Food Insecurity (07/08/2024)   Hunger Vital Sign    Worried About Running Out of Food in the Last Year: Never true    Ran Out of Food in the Last Year: Never true  Transportation Needs: No Transportation Needs (07/08/2024)   PRAPARE - Administrator, Civil Service (Medical): No    Lack of Transportation (Non-Medical): No  Physical Activity: Inactive (07/08/2024)   Exercise Vital Sign    Days of Exercise per Week: 0 days  Minutes of Exercise per Session: Not on file  Stress: No Stress Concern Present (07/08/2024)   [employer] of Occupational Health - Occupational Stress Questionnaire    Feeling of Stress: Not at all  Social Connections: Socially Integrated (07/08/2024)   Social Connection and Isolation Panel    Frequency of Communication with Friends and Family: More than three times a week    Frequency of Social Gatherings with Friends and Family: More than three times a week    Attends Religious Services: More than 4 times per year    Active Member of [employer] or Organizations: Yes    Attends Engineer, Structural: More than 4 times per year    Marital Status: Married      Review of Systems  Constitutional:  Negative for appetite change and unexpected weight change.  HENT:  Negative for congestion, sinus pressure and sore throat.   Eyes:  Negative for pain and visual disturbance.  Respiratory:  Negative for cough, chest tightness and shortness of breath.   Cardiovascular:  Negative for chest pain, palpitations and leg swelling.  Gastrointestinal:  Negative for abdominal pain, diarrhea, nausea and vomiting.  Genitourinary:  Negative for difficulty urinating and dysuria.  Musculoskeletal:  Negative for joint swelling and myalgias.  Skin:  Negative for color change and rash.  Neurological:  Negative for dizziness and headaches.  Hematological:  Negative for adenopathy. Does not bruise/bleed easily.  Psychiatric/Behavioral:  Negative for agitation and dysphoric mood.        Objective:     BP 120/70   Pulse 80   Temp 97.7 F (36.5 C) (Oral)   Ht 4' 11 (1.499 m)   Wt 160 lb 8 oz (72.8 kg)   LMP 02/13/2013   SpO2 97%   BMI 32.42 kg/m  Wt Readings from Last 3 Encounters:  07/09/24 160 lb 8 oz (72.8 kg)  04/02/24 158 lb 12.8 oz (72 kg)  02/21/24 152 lb 9.6 oz (69.2 kg)    Physical Exam Vitals reviewed.  Constitutional:      General: She is not in acute distress.    Appearance: Normal appearance. She is well-developed.  HENT:     Head: Normocephalic and atraumatic.     Right Ear: External ear normal.     Left Ear: External ear normal.     Mouth/Throat:     Pharynx: No oropharyngeal exudate or posterior oropharyngeal erythema.  Eyes:     General: No scleral icterus.       Right eye: No discharge.        Left eye: No discharge.     Conjunctiva/sclera: Conjunctivae normal.  Neck:     Thyroid : No thyromegaly.  Cardiovascular:     Rate and Rhythm: Normal rate and regular rhythm.  Pulmonary:     Effort: No tachypnea, accessory muscle usage or respiratory distress.     Breath sounds: Normal breath sounds. No decreased breath sounds or  wheezing.  Chest:  Breasts:    Right: No inverted nipple, mass, nipple discharge or tenderness (no axillary adenopathy).     Left: No inverted nipple, mass, nipple discharge or tenderness (no axilarry adenopathy).  Abdominal:     General: Bowel sounds are normal.     Palpations: Abdomen is soft.     Tenderness: There is no abdominal tenderness.  Musculoskeletal:        General: No swelling or tenderness.     Cervical back: Neck supple.  Lymphadenopathy:     Cervical: No cervical  adenopathy.  Skin:    Findings: No erythema or rash.  Neurological:     Mental Status: She is alert and oriented to person, place, and time.  Psychiatric:        Mood and Affect: Mood normal.        Behavior: Behavior normal.         Outpatient Encounter Medications as of 07/09/2024  Medication Sig   acyclovir  ointment (ZOVIRAX ) 5 % Apply topically tis.   amLODipine  (NORVASC ) 5 MG tablet TAKE 1 TABLET BY MOUTH TWICE A DAY   aspirin  81 MG tablet Take 81 mg by mouth daily.   ferrous sulfate 325 (65 FE) MG tablet Take 325 mg by mouth daily with breakfast.   fexofenadine  (ALLEGRA  ALLERGY) 60 MG tablet Take 1 tablet (60 mg total) by mouth 2 (two) times daily.   glucose blood (ONETOUCH VERIO) test strip CHECK BLOOD SUGAR TWICE A DAY 3 TO 4 TIMES A WEEK   Lancets (ONETOUCH ULTRASOFT) lancets Use as instructed to check Blood sugars 3-4 times a week, twice a day.  Dispense One Touch brand. E11.9   Magnesium  250 MG TABS Take 250 mg by mouth at bedtime.   rosuvastatin  (CRESTOR ) 40 MG tablet TAKE 1 TABLET BY MOUTH EVERY DAY   vitamin B-12 (CYANOCOBALAMIN) 1000 MCG tablet Take 1,000 mcg by mouth daily.   Vitamin D3 (VITAMIN D ) 25 MCG tablet Take 1,000 Units by mouth daily.   carvedilol  (COREG ) 12.5 MG tablet Take 1 tablet (12.5 mg total) by mouth 2 (two) times daily with a meal.   losartan  (COZAAR ) 100 MG tablet Take 1 tablet (100 mg total) by mouth daily.   pantoprazole  (PROTONIX ) 40 MG tablet Take 1 tablet (40  mg total) by mouth daily before breakfast.   solifenacin  (VESICARE ) 5 MG tablet Take 1 tablet (5 mg total) by mouth daily.   [DISCONTINUED] amoxicillin -clavulanate (AUGMENTIN ) 875-125 MG tablet Take 1 tablet by mouth 2 (two) times daily.   [DISCONTINUED] carvedilol  (COREG ) 12.5 MG tablet TAKE 1 TABLET (12.5MG  TOTAL) BY MOUTH TWICE A DAY WITH MEALS   [DISCONTINUED] guaiFENesin  (MUCINEX ) 600 MG 12 hr tablet Take 1 tablet (600 mg total) by mouth 2 (two) times daily. (Patient not taking: Reported on 07/09/2024)   [DISCONTINUED] losartan  (COZAAR ) 100 MG tablet TAKE 1 TABLET BY MOUTH EVERY DAY   [DISCONTINUED] pantoprazole  (PROTONIX ) 40 MG tablet Take 1 tablet (40 mg total) by mouth daily before breakfast.   [DISCONTINUED] predniSONE  (DELTASONE ) 20 MG tablet Take 2 tablets (40 mg total) by mouth daily with breakfast.   [DISCONTINUED] solifenacin  (VESICARE ) 5 MG tablet TAKE 1 TABLET (5 MG TOTAL) BY MOUTH DAILY.   No facility-administered encounter medications on file as of 07/09/2024.     Lab Results  Component Value Date   WBC 4.3 01/29/2024   HGB 12.8 01/29/2024   HCT 38.8 01/29/2024   PLT 269.0 01/29/2024   GLUCOSE 115 (H) 07/05/2024   CHOL 142 07/05/2024   TRIG 131.0 07/05/2024   HDL 47.80 07/05/2024   LDLDIRECT 83.0 03/09/2023   LDLCALC 68 07/05/2024   ALT 15 07/05/2024   AST 13 07/05/2024   NA 141 07/05/2024   K 3.9 07/05/2024   CL 106 07/05/2024   CREATININE 0.88 07/05/2024   BUN 16 07/05/2024   CO2 30 07/05/2024   TSH 2.46 08/24/2023   INR 1.0 11/02/2020   HGBA1C 6.4 07/05/2024   MICROALBUR 0.9 07/09/2024       Assessment & Plan:  Health care maintenance Assessment &  Plan: Physical today 07/09/24. PAP 06/06/22 - negative with negative HPV. Mammogram 09/11/23 - Birads I. Colonoscopy 08/2023 - hemorrhoids, mild diverticulosis. Recommended f/u colonoscopy in 10 years.    Type 2 diabetes mellitus with hyperglycemia, without long-term current use of insulin (HCC) Assessment &  Plan: Low carb diet and exercise. Follow met b and A1c.   Orders: -     Microalbumin / creatinine urine ratio -     Hemoglobin A1c; Future  Visit for screening mammogram -     3D Screening Mammogram, Left and Right; Future  Essential hypertension, benign Assessment & Plan: Continues on Coreg , losartan  and amlodipine . On coreg  12.5mg  bid. Blood pressures as outlined. Follow presures. Follow metabolic panel. No change in medication today.   Orders: -     Basic metabolic panel with GFR; Future  Hypercholesterolemia Assessment & Plan: Low cholesterol diet and exercise. Follow lipid panel.   Orders: -     Hepatic function panel; Future -     Lipid panel; Future  Sleep apnea, unspecified type Assessment & Plan: CPAP.    Swelling of left lower extremity Assessment & Plan: Continue compression hose. Continue lymphedema pump. Swelling improved. Follow.    Lesion of adrenal gland Assessment & Plan: Consistent with adrenal adenoma.  Work-up unrevealing.  Seeing Dr MALVATHEORA Laundry. Saw endocrinology 12/07/22 - f/u adrenal adenoma.  Labs - no evidence of adrenal excess.    Other orders -     Carvedilol ; Take 1 tablet (12.5 mg total) by mouth 2 (two) times daily with a meal.  Dispense: 180 tablet; Refill: 1 -     Losartan  Potassium; Take 1 tablet (100 mg total) by mouth daily.  Dispense: 90 tablet; Refill: 1 -     Pantoprazole  Sodium; Take 1 tablet (40 mg total) by mouth daily before breakfast.  Dispense: 90 tablet; Refill: 1 -     Solifenacin  Succinate; Take 1 tablet (5 mg total) by mouth daily.  Dispense: 90 tablet; Refill: 1     Allena Hamilton, MD

## 2024-07-09 NOTE — Assessment & Plan Note (Signed)
 Physical today 07/09/24. PAP 06/06/22 - negative with negative HPV. Mammogram 09/11/23 - Birads I. Colonoscopy 08/2023 - hemorrhoids, mild diverticulosis. Recommended f/u colonoscopy in 10 years.

## 2024-07-10 LAB — MICROALBUMIN / CREATININE URINE RATIO
Creatinine,U: 79.3 mg/dL
Microalb Creat Ratio: 10.9 mg/g (ref 0.0–30.0)
Microalb, Ur: 0.9 mg/dL (ref 0.0–1.9)

## 2024-07-11 ENCOUNTER — Ambulatory Visit: Payer: Self-pay | Admitting: Internal Medicine

## 2024-07-14 ENCOUNTER — Encounter: Payer: Self-pay | Admitting: Internal Medicine

## 2024-07-14 NOTE — Assessment & Plan Note (Signed)
 Continues on Coreg , losartan  and amlodipine . On coreg  12.5mg  bid. Blood pressures as outlined. Follow presures. Follow metabolic panel. No change in medication today.

## 2024-07-14 NOTE — Assessment & Plan Note (Signed)
 Low-carb diet and exercise.  Follow met b and A1c.

## 2024-07-14 NOTE — Assessment & Plan Note (Signed)
 Continue compression hose. Continue lymphedema pump. Swelling improved. Follow.

## 2024-07-14 NOTE — Assessment & Plan Note (Signed)
 Low cholesterol diet and exercise.  Follow lipid panel.

## 2024-07-14 NOTE — Assessment & Plan Note (Signed)
 CPAP.

## 2024-07-14 NOTE — Assessment & Plan Note (Signed)
Consistent with adrenal adenoma.  Work-up unrevealing.  Seeing Dr Val EagleElveria Rising. Saw endocrinology 12/07/22 - f/u adrenal adenoma.  Labs - no evidence of adrenal excess.

## 2024-10-28 ENCOUNTER — Ambulatory Visit (INDEPENDENT_AMBULATORY_CARE_PROVIDER_SITE_OTHER): Admitting: Vascular Surgery

## 2024-11-05 ENCOUNTER — Other Ambulatory Visit

## 2024-11-07 ENCOUNTER — Ambulatory Visit: Admitting: Internal Medicine
